# Patient Record
Sex: Female | Born: 1955
Health system: Southern US, Community
[De-identification: ages and names within clinical notes are randomized; demographics above are authoritative.]

## PROBLEM LIST (undated history)

## (undated) DIAGNOSIS — K219 Gastro-esophageal reflux disease without esophagitis: Secondary | ICD-10-CM

## (undated) DIAGNOSIS — K5730 Diverticulosis of large intestine without perforation or abscess without bleeding: Secondary | ICD-10-CM

## (undated) DIAGNOSIS — J449 Chronic obstructive pulmonary disease, unspecified: Secondary | ICD-10-CM

## (undated) DIAGNOSIS — H409 Unspecified glaucoma: Secondary | ICD-10-CM

## (undated) DIAGNOSIS — B0089 Other herpesviral infection: Secondary | ICD-10-CM

## (undated) DIAGNOSIS — E039 Hypothyroidism, unspecified: Secondary | ICD-10-CM

## (undated) HISTORY — PX: TONSILLECTOMY: SUR1361

## (undated) HISTORY — DX: Hypothyroidism, unspecified: E03.9

## (undated) HISTORY — DX: Unspecified glaucoma: H40.9

## (undated) HISTORY — PX: VAGINAL HYSTERECTOMY: SUR661

## (undated) HISTORY — DX: Diverticulosis of large intestine without perforation or abscess without bleeding: K57.30

## (undated) HISTORY — PX: INCONTINENCE SURGERY: SHX676

## (undated) HISTORY — PX: BREAST EXCISIONAL BIOPSY: SUR124

## (undated) HISTORY — DX: Chronic obstructive pulmonary disease, unspecified: J44.9

## (undated) HISTORY — DX: Gastro-esophageal reflux disease without esophagitis: K21.9

## (undated) HISTORY — PX: BREAST CYST EXCISION: SHX579

## (undated) HISTORY — DX: Other herpesviral infection: B00.89

---

## 2005-06-01 ENCOUNTER — Encounter: Payer: Self-pay | Admitting: Family Medicine

## 2005-06-19 ENCOUNTER — Encounter: Payer: Self-pay | Admitting: Family Medicine

## 2007-01-03 ENCOUNTER — Encounter: Payer: Self-pay | Admitting: Family Medicine

## 2007-02-11 ENCOUNTER — Encounter: Payer: Self-pay | Admitting: Family Medicine

## 2007-12-13 ENCOUNTER — Encounter: Payer: Self-pay | Admitting: Family Medicine

## 2008-01-03 ENCOUNTER — Encounter: Payer: Self-pay | Admitting: Family Medicine

## 2008-05-21 ENCOUNTER — Encounter: Payer: Self-pay | Admitting: Family Medicine

## 2009-07-16 ENCOUNTER — Encounter: Payer: Self-pay | Admitting: Family Medicine

## 2009-07-16 ENCOUNTER — Other Ambulatory Visit: Admission: RE | Admit: 2009-07-16 | Discharge: 2009-07-16 | Payer: Self-pay | Admitting: Family Medicine

## 2009-07-16 ENCOUNTER — Ambulatory Visit: Payer: Self-pay | Admitting: Family Medicine

## 2009-07-16 LAB — CONVERTED CEMR LAB
Bilirubin Urine: NEGATIVE
Blood in Urine, dipstick: NEGATIVE
Glucose, Urine, Semiquant: NEGATIVE
Ketones, urine, test strip: NEGATIVE
Nitrite: NEGATIVE
Protein, U semiquant: NEGATIVE
Specific Gravity, Urine: <1.005
Urobilinogen, UA: 0.2
WBC Urine, dipstick: NEGATIVE
pH: 5.5

## 2009-07-17 ENCOUNTER — Encounter: Payer: Self-pay | Admitting: Family Medicine

## 2009-07-18 LAB — CONVERTED CEMR LAB
Clue Cells Wet Prep HPF POC: NONE SEEN
Trich, Wet Prep: NONE SEEN
Yeast Wet Prep HPF POC: NONE SEEN

## 2009-09-20 ENCOUNTER — Encounter: Admission: RE | Admit: 2009-09-20 | Discharge: 2009-09-20 | Payer: Self-pay | Admitting: Family Medicine

## 2009-09-29 ENCOUNTER — Telehealth: Payer: Self-pay | Admitting: Family Medicine

## 2009-09-29 DIAGNOSIS — Z780 Asymptomatic menopausal state: Secondary | ICD-10-CM | POA: Insufficient documentation

## 2009-09-29 DIAGNOSIS — H409 Unspecified glaucoma: Secondary | ICD-10-CM

## 2009-09-29 HISTORY — DX: Unspecified glaucoma: H40.9

## 2010-10-13 ENCOUNTER — Ambulatory Visit: Payer: Self-pay | Admitting: Family Medicine

## 2010-10-13 DIAGNOSIS — E0789 Other specified disorders of thyroid: Secondary | ICD-10-CM | POA: Insufficient documentation

## 2010-10-13 DIAGNOSIS — J0100 Acute maxillary sinusitis, unspecified: Secondary | ICD-10-CM | POA: Insufficient documentation

## 2010-10-13 DIAGNOSIS — J019 Acute sinusitis, unspecified: Secondary | ICD-10-CM | POA: Insufficient documentation

## 2010-10-14 ENCOUNTER — Telehealth: Payer: Self-pay | Admitting: Family Medicine

## 2010-10-14 LAB — CONVERTED CEMR LAB: TSH: 2.602 microintl units/mL (ref 0.350–4.500)

## 2010-11-23 ENCOUNTER — Ambulatory Visit
Admission: RE | Admit: 2010-11-23 | Discharge: 2010-11-23 | Payer: Self-pay | Source: Home / Self Care | Attending: Family Medicine | Admitting: Family Medicine

## 2010-12-10 ENCOUNTER — Ambulatory Visit
Admission: RE | Admit: 2010-12-10 | Discharge: 2010-12-10 | Payer: Self-pay | Source: Home / Self Care | Admitting: Emergency Medicine

## 2010-12-10 ENCOUNTER — Encounter: Payer: Self-pay | Admitting: Family Medicine

## 2010-12-10 DIAGNOSIS — K5730 Diverticulosis of large intestine without perforation or abscess without bleeding: Secondary | ICD-10-CM | POA: Insufficient documentation

## 2010-12-10 HISTORY — DX: Diverticulosis of large intestine without perforation or abscess without bleeding: K57.30

## 2010-12-10 LAB — CONVERTED CEMR LAB
Bilirubin Urine: NEGATIVE
Glucose, Urine, Semiquant: NEGATIVE
Nitrite: NEGATIVE
Specific Gravity, Urine: 1.02
Urobilinogen, UA: 0.2
pH: 5.5

## 2010-12-12 ENCOUNTER — Telehealth: Payer: Self-pay | Admitting: Family Medicine

## 2010-12-13 ENCOUNTER — Telehealth (INDEPENDENT_AMBULATORY_CARE_PROVIDER_SITE_OTHER): Payer: Self-pay | Admitting: *Deleted

## 2010-12-13 NOTE — Assessment & Plan Note (Signed)
Summary: Sinsutis   Vital Signs:  Patient profile:   55 year old female Height:      63.5 inches Weight:      148 pounds BMI:     25.90 O2 Sat:      94 % on Room air Temp:     98.1 degrees F oral Pulse rate:   80 / minute BP sitting:   122 / 66  (left arm) Cuff size:   regular  Vitals Entered By: Payton Spark CMA (October 13, 2010 4:19 PM)  O2 Flow:  Room air CC: Sinus congestion and cough x 1 week.    Primary Care Provider:  Nani Gasser, MD  CC:  Sinus congestion and cough x 1 week. Marland Kitchen  History of Present Illness: Sinus congestion and cough x 1 week.   Had similar signs about 2-3 weeks ago and then got better and now started to feel worse the last 3 days.  Drainage and cough. cough is primarily productive and has a "bad color".   Bloody nasal discharge. NO fever. Took theraflu - minimal relief.  Last time lasted about 2 weeks. Feels very fatigued.  Head with some pressure. No ear pain.   No GI sxs.   Allergies: No Known Drug Allergies  Physical Exam  General:  Well-developed,well-nourished,in no acute distress; alert,appropriate and cooperative throughout examination Head:  Normocephalic and atraumatic without obvious abnormalities. No apparent alopecia or balding. Eyes:  No corneal or conjunctival inflammation noted. EOMI. Perrla. Ears:  External ear exam shows no significant lesions or deformities.  Otoscopic examination reveals clear canals, tympanic membranes are intact bilaterally without bulging, retraction, inflammation or discharge. Hearing is grossly normal bilaterally. Nose:  External nasal examination shows no deformity or inflammation. Nasal mucosa are pink and moist without lesions or exudates. Mouth:  Oral mucosa and oropharynx without lesions or exudates.   Neck:  No deformities, masses, or tenderness noted. Lungs:  Normal respiratory effort, chest expands symmetrically. Lungs are clear to auscultation, no crackles or wheezes. Heart:  Normal rate and  regular rhythm. S1 and S2 normal without gallop, murmur, click, rub or other extra sounds. Skin:  no rashes.   Cervical Nodes:  No lymphadenopathy noted Psych:  Cognition and judgment appear intact. Alert and cooperative with normal attention span and concentration. No apparent delusions, illusions, hallucinations   Impression & Recommendations:  Problem # 1:  SINUSITIS - ACUTE-NOS (ICD-461.9) I explained to her that her symptoms are possibly viral.  It sounds to me more like she has had two viral illnesses and red.  I will go ahead and give her an antibiotic to fill if she continues to get worse or she is not feeling better.  I explained to her that she pills antibiotics and doesn't feel tremendously better within a few days that it probably is viral and will need to run its course for one to two weeks.  I do understand her frustration with being sick lately and not wanting to be sick again.  I also in case her a cough medicine to use that at times she is sleeping very poorly secondary to cough. Her updated medication list for this problem includes:    Hydrocodone-homatropine 5-1.5 Mg/67ml Syrp (Hydrocodone-homatropine) .Marland KitchenMarland KitchenMarland KitchenMarland Kitchen 5ml by mouth at bedtime as needed cough    Amoxicillin 875 Mg Tabs (Amoxicillin) .Marland Kitchen... Take 1 tablet by mouth two times a day for 10 days  Complete Medication List: 1)  Zyrtec Allergy 10 Mg Tabs (Cetirizine hcl) .... Take one tablet  by mouth one ad ay 2)  Lumigan 0.03 % Soln (Bimatoprost) .... Apply one drop each eye daily 3)  Multivitamins Tabs (Multiple vitamin) .... Take one tablet by mouth once a day 4)  Vitamin C 500 Mg Chew (Ascorbic acid) .... Take one tablet by mouth once a adya 5)  Aspir-low 81 Mg Tbec (Aspirin) .... Take 1/2 tablet by mouth once ad ay 6)  Hydrocodone-homatropine 5-1.5 Mg/16ml Syrp (Hydrocodone-homatropine) .... 5ml by mouth at bedtime as needed cough 7)  Amoxicillin 875 Mg Tabs (Amoxicillin) .... Take 1 tablet by mouth two times a day for 10  days  Other Orders: T-TSH ([address]-60454) T-Mammography Bilateral Screening (09811) Tdap => 65yrs IM (91478) Admin 1st Vaccine (29562) Admin 1st Vaccine (13086) Flu Vaccine 70yrs + (57846)  Patient Instructions: 1)  Call if not better in one week 2)  Call [phone] to schedule your mammogram.  Prescriptions: AMOXICILLIN 875 MG TABS (AMOXICILLIN) Take 1 tablet by mouth two times a day for 10 days  #20 x 0   Entered and Authorized by:   Nani Gasser MD   Signed by:   Nani Gasser MD on 10/13/2010   Method used:   Electronically to        [facility] #[id number]* (retail)       [address], [place]  [address]       Ph: [phone]       Fax: [phone]   RxID:   [id number] AMOXICILLIN 875 MG TABS (AMOXICILLIN) Take 1 tablet by mouth two times a day for 10 days  #20 x 0   Entered and Authorized by:   Nani Gasser MD   Signed by:   Nani Gasser MD on 10/13/2010   Method used:   Printed then faxed to ...       [facility] #[id number]* (retail)       [address], [place]  [address]       Ph: [phone]       Fax: [phone]   RxID:   [id number] HYDROCODONE-HOMATROPINE 5-1.5 MG/5ML SYRP (HYDROCODONE-HOMATROPINE) 5ml by mouth at bedtime as needed cough  #184ml x 0   Entered and Authorized by:   Nani Gasser MD   Signed by:   Nani Gasser MD on 10/13/2010   Method used:   Printed then faxed to ...       [facility] [phone]* (retail)       [address], [place]  [address]       Ph: [phone]       Fax: [phone]   RxID:   [id number]    Orders Added: 1)  T-TSH [phone] 2)  T-Mammography Bilateral Screening [77057] 3)  Tdap => 106yrs IM [90715] 4)  Admin 1st Vaccine [90471] 5)  Admin 1st Vaccine [90471] 6)  Flu Vaccine 65yrs + [17510] 7)  Est. Patient Level III [25852]   Immunizations Administered:  Tetanus Vaccine:    Vaccine Type: Tdap    Site:  right deltoid    Dose: 0.5 ml    Route: IM    Given by: Payton Spark CMA    Exp. Date: 09/01/2012    Lot #: [id number]    VIS given: 09/30/08 version given October 13, 2010.   Immunizations Administered:  Tetanus Vaccine:    Vaccine Type: Tdap    Site:  right deltoid    Dose: 0.5 ml    Route: IM    Given by: Payton Spark CMA    Exp. Date: 09/01/2012    Lot #: [id number]    VIS given: 09/30/08 version given October 13, 2010.   Flu Vaccine Consent Questions     Do you have a history of severe allergic reactions to this vaccine? no    Any prior history of allergic reactions to egg and/or gelatin? no    Do you have a sensitivity to the preservative Thimersol? no    Do you have a past history of Guillan-Barre Syndrome? no    Do you currently have an acute febrile illness? no    Have you ever had a severe reaction to latex? no    Vaccine information given and explained to patient? yes    Are you currently pregnant? no    Lot Number:[id number]   Exp Date:05/13/2011   Site Given  Left Deltoid IM    .lbflu

## 2010-12-13 NOTE — Assessment & Plan Note (Signed)
Summary: NOV: pap, vaginitis,    Vital Signs:  Patient profile:   55 year old female Height:      63.5 inches Weight:      144 pounds BMI:     25.20 Pulse rate:   71 / minute BP sitting:   130 / 71  (left arm) Cuff size:   regular  Vitals Entered By: Kathlene November (July 16, 2009 9:34 AM) CC: NP- pap smear Is Patient Diabetic? No   Primary Care Clide Remmers:  Nani Gasser, MD  CC:  NP- pap smear.  History of Present Illness: Has been having some vaginal itching and irriation. Tried some OTC fungal cream but didn't help. Sxs for 1-1/2 months. Some irritation when urinates.    No vaginal d/c.  Married.  No other alleviating or aggrevating factors. Say has been on HRT in the past but says she feels it mader her hot flashes et worse so is not on anything. Says also not sleeping well.   Habits & Providers  Alcohol-Tobacco-Diet     Alcohol drinks/day: 1     Tobacco Status: current     Cigarette Packs/Day: 1.0  Exercise-Depression-Behavior     Does Patient Exercise: no     STD Risk: never     Drug Use: no     Seat Belt Use: always  Current Medications (verified): 1)  Zyrtec Allergy 10 Mg Tabs (Cetirizine Hcl) .... Take One Tablet By Mouth One Ad Ay 2)  Lumigan 0.03 % Soln (Bimatoprost) .... Apply One Drop Each Eye Daily 3)  Multivitamins  Tabs (Multiple Vitamin) .... Take One Tablet By Mouth Once A Day 4)  Vitamin C 500 Mg Chew (Ascorbic Acid) .... Take One Tablet By Mouth Once A Adya 5)  Aspir-Low 81 Mg Tbec (Aspirin) .... Take 1/2 Tablet By Mouth Once Ad Ay  Allergies (verified): No Known Drug Allergies  Comments:  Nurse/Medical Assistant: The patient's medications and allergies were reviewed with the patient and were updated in the Medication and Allergy Lists. Kathlene November (July 16, 2009 9:37 AM)  Past History:  Past Medical History: None  Past Surgical History: Lump in breast Cyst in wrist hysterectomy and oophorectomy- tumor, noncancerous Bladder  surgery  Family History: Father with Lung Ca GM with MI Mother with thyroid dz.   Social History: HS degree.  Married to [employer] with one child.   Current Smoker Alcohol use-yes Drug use-no Regular exercise-no Smoking Status:  current Packs/Day:  1.0 Does Patient Exercise:  no STD Risk:  never Drug Use:  no Seat Belt Use:  always  Review of Systems       No fever/sweats/weakness, unexplained weight loss/gain.  No vison changes.  No difficulty hearing/ringing in ears, hay fever/allergies.  No chest pain/discomfort, palpitations.  No Br lump/nipple discharge.  No cough/wheeze.  No blood in BM, nausea/vomiting/diarrhea.  No nighttime urination, leaking urine, unusual vaginal bleeding, discharge (penis or vagina).  No muscle/joint pain. No rash, change in mole.  No HA, memory loss.  No anxiety, sleep d/o, depression.  No easy bruising/bleeding, unexplained lump   Physical Exam  General:  Well-developed,well-nourished,in no acute distress; alert,appropriate and cooperative throughout examination Head:  Normocephalic and atraumatic without obvious abnormalities. No apparent alopecia or balding. Lungs:  Normal respiratory effort, chest expands symmetrically. Lungs are clear to auscultation, no crackles or wheezes. Heart:  Normal rate and regular rhythm. S1 and S2 normal without gallop, murmur, click, rub or other extra sounds. Genitalia:  Normal introitus for  age, no external lesions, no vaginal discharge, mucosa pink. No masses.  Some mild vaginal atrophy.  mucosa was tender.   Skin:  no rashes.   Psych:  Cognition and judgment appear intact. Alert and cooperative with normal attention span and concentration. No apparent delusions, illusions, hallucinations   Impression & Recommendations:  Problem # 1:  SCREENING FOR MALIGNANT NEOPLASM OF THE CERVIX (ICD-V76.2) Pap performed per pt request but let her know that since she has had a hystrectomy doesn' t  need any future paps.     Problem # 2:  VAGINITIS (ICD-616.10)  Discussed will send wet prep. UA was neg for infection.  though on exam has some vaginal atrophyl   Orders: T-Wet Prep for Christoper Allegra, [facility] [phone]) UA Dipstick w/o Micro (automated)  (81003)  Complete Medication List: 1)  Zyrtec Allergy 10 Mg Tabs (Cetirizine hcl) .... Take one tablet by mouth one ad ay 2)  Lumigan 0.03 % Soln (Bimatoprost) .... Apply one drop each eye daily 3)  Multivitamins Tabs (Multiple vitamin) .... Take one tablet by mouth once a day 4)  Vitamin C 500 Mg Chew (Ascorbic acid) .... Take one tablet by mouth once a adya 5)  Aspir-low 81 Mg Tbec (Aspirin) .... Take 1/2 tablet by mouth once ad ay  Other Orders: T-Mammography Bilateral Screening (09811)  Laboratory Results   Urine Tests  Date/Time Received: 07/16/2009 Date/Time Reported: 07/16/2009  Routine Urinalysis   Color: yellow Appearance: Clear Glucose: negative   (Normal Range: Negative) Bilirubin: negative   (Normal Range: Negative) Ketone: negative   (Normal Range: Negative) Spec. Gravity: <1.005   (Normal Range: 1.003-1.035) Blood: negative   (Normal Range: Negative) pH: 5.5   (Normal Range: 5.0-8.0) Protein: negative   (Normal Range: Negative) Urobilinogen: 0.2   (Normal Range: 0-1) Nitrite: negative   (Normal Range: Negative) Leukocyte Esterace: negative   (Normal Range: Negative)        Appended Document: NOV: pap, vaginitis,     Past History:  Past Medical History: Hx of GERD    Lipid Panel Test Date: 05/21/2008                        Value        Units        H/L   Reference  Cholesterol:          226          mg/dL              (914-782) LDL Cholesterol:      132          mg/dL              (95-621) HDL Cholesterol:      70           mg/dL              (30-86) Triglyceride:         11           mg/dL         L    (57-846)   Flex Sig Next Due:  Not Indicated Colonoscopy Result Date:  02/11/2007 Colonoscopy Result:   normal Colonoscopy Next Due:  10 yr Hemoccult Next Due:  Not Indicated   Appended Document: NOV: pap, vaginitis,   Mammogram Result Date:  01/03/2008 Mammogram Result:  normal Mammogram Next Due:  2 yr    Preventive Care Screening  Bone Density:  Date:  12/03/2007    Next Due:  12/2009    Results:  abnormal

## 2010-12-13 NOTE — Progress Notes (Signed)
Summary: Cough med  Phone Note Call from Patient Call back at Home Phone [phone]   Caller: Patient Call For: Nani Gasser MD Summary of Call: Pt calls and states the pharmacy never received the cough med- they got the antibiotic but no cough med. Can you please refax the med to pharamcy Initial call taken by: Kathlene November LPN,  October 14, 2010 9:46 AM  Follow-up for Phone Call        this has been done.was faxed again after this message was taken fri Follow-up by: Avon Gully CMA, Duncan Dull),  October 17, 2010 10:53 AM

## 2010-12-13 NOTE — Progress Notes (Signed)
----   Converted from flag ---- ---- 09/29/2009 1:07 PM, Kathlene November wrote: 09/27/2009 @ 1:06pm-Pt notified. KJ LPN Need cholesterol order  ---- 09/29/2009 12:47 PM, Nani Gasser MD wrote: Also needs an up to date cholesterol. Has been over a year.   ---- 09/29/2009 12:46 PM, Nani Gasser MD wrote: Call pt: Went through old records and don't have a tetanus listed for you.  Can make nurse visiti to get if would like. ------------------------------     New Problems: SCREENING FOR LIPOID DISORDERS (ICD-V77.91)   New Problems: SCREENING FOR LIPOID DISORDERS (ICD-V77.91)

## 2010-12-13 NOTE — Procedures (Signed)
Summary: Colon/[facility]  Colon/[facility]   Imported By: Lester Lamar 10/01/2009 09:43:05  _____________________________________________________________________  External Attachment:    Type:   Image     Comment:   External Document

## 2010-12-15 NOTE — Assessment & Plan Note (Signed)
Summary: sinusitis   Vital Signs:  Patient profile:   55 year old female Height:      63.5 inches Weight:      146 pounds BMI:     25.55 O2 Sat:      95 % on Room air Temp:     98.5 degrees F oral Pulse rate:   70 / minute BP sitting:   129 / 65  (left arm) Cuff size:   regular  Vitals Entered By: Payton Spark CMA (November 23, 2010 1:25 PM)  O2 Flow:  Room air CC: Sinusitis x weeks.    Primary Care Provider:  Nani Gasser, MD  CC:  Sinusitis x weeks. Marland Kitchen  History of Present Illness: 55 yo WF presents for head congestion and HA, cough and postnasal drip x 2-3 wks with aching all over.  She is taking theraflu which helps some.  She has glaucoma so is supposed to stay off decongestants.  Her cough keeps her up at night and she's a smoker.  She has some SOB from coughing spells but no chest tightness.  She has a lot of rhinorrhea.  She has frontal sinus pressure.      Current Medications (verified): 1)  Zyrtec Allergy 10 Mg Tabs (Cetirizine Hcl) .... Take One Tablet By Mouth One Ad Ay 2)  Lumigan 0.03 % Soln (Bimatoprost) .... Apply One Drop Each Eye Daily 3)  Multivitamins  Tabs (Multiple Vitamin) .... Take One Tablet By Mouth Once A Day 4)  Vitamin C 500 Mg Chew (Ascorbic Acid) .... Take One Tablet By Mouth Once A Adya 5)  Aspir-Low 81 Mg Tbec (Aspirin) .... Take 1/2 Tablet By Mouth Once Ad Ay  Allergies (verified): No Known Drug Allergies  Past History:  Past Medical History: Hx of GERD smoker  Social History: Reviewed history from 07/16/2009 and no changes required. HS degree.  Married to [employer] with one child.   Current Smoker Alcohol use-yes Drug use-no Regular exercise-no  Review of Systems      See HPI  Physical Exam  General:  alert, well-developed, well-nourished, and well-hydrated.   Head:  normocephalic and atraumatic.  slight maxillary edema with tenderness Eyes:  conjunctiva glassy, clear, EOMI Ears:  EACs patent; TMs translucent  and gray with good cone of light and bony landmarks.  Nose:  nasal congestions Mouth:  pharynx pink and moist.  o/p injected Lungs:  Normal respiratory effort, chest expands symmetrically. Lungs are clear to auscultation, no crackles or wheezes.  rhonchi with cough only Heart:  Normal rate and regular rhythm. S1 and S2 normal without gallop, murmur, click, rub or other extra sounds. Skin:  color normal.   Cervical Nodes:  No lymphadenopathy noted   Impression & Recommendations:  Problem # 1:  SINUSITIS - ACUTE-NOS (ICD-461.9) Treat with 10 days Amoxicillin + Cheratussin AC at night in addition to supportive care measures spelt out under pt instructions.  Call if not improved in 10 days. The following medications were removed from the medication list:    Hydrocodone-homatropine 5-1.5 Mg/29ml Syrp (Hydrocodone-homatropine) .Marland KitchenMarland KitchenMarland KitchenMarland Kitchen 5ml by mouth at bedtime as needed cough    Amoxicillin 875 Mg Tabs (Amoxicillin) .Marland Kitchen... Take 1 tablet by mouth two times a day for 10 days Her updated medication list for this problem includes:    Amoxicillin 500 Mg Caps (Amoxicillin) .Marland Kitchen... 1 capsule by mouth three times a day x 10 days    Cheratussin Ac 100-10 Mg/72ml Syrp (Guaifenesin-codeine) .Marland Kitchen... 5-10 ml by mouth at bedtime as  needed cough  Complete Medication List: 1)  Zyrtec Allergy 10 Mg Tabs (Cetirizine hcl) .... Take one tablet by mouth one ad ay 2)  Lumigan 0.03 % Soln (Bimatoprost) .... Apply one drop each eye daily 3)  Multivitamins Tabs (Multiple vitamin) .... Take one tablet by mouth once a day 4)  Vitamin C 500 Mg Chew (Ascorbic acid) .... Take one tablet by mouth once a adya 5)  Aspir-low 81 Mg Tbec (Aspirin) .... Take 1/2 tablet by mouth once ad ay 6)  Amoxicillin 500 Mg Caps (Amoxicillin) .Marland Kitchen.. 1 capsule by mouth three times a day x 10 days 7)  Cheratussin Ac 100-10 Mg/23ml Syrp (Guaifenesin-codeine) .... 5-10 ml by mouth at bedtime as needed cough  Patient Instructions: 1)  Take 10 days of  Amoxicilin for sinusitis. 2)  Avoid smoking. 3)  Use OTC Mucinex + Ibuprofen + nasal saline during the day and RX Cheratussin AC at night for symptomatic relief. 4)  Call if not improved in 10 days.   Prescriptions: CHERATUSSIN AC 100-10 MG/5ML SYRP (GUAIFENESIN-CODEINE) 5-10 ml by mouth at bedtime as needed cough  #150 ml x 0   Entered and Authorized by:   Seymour Bars DO   Signed by:   Seymour Bars DO on 11/23/2010   Method used:   Printed then faxed to ...       [facility] [phone]* (retail)       [address], [place]  [address]       Ph: [phone]       Fax: [phone]   RxID:   [id number] AMOXICILLIN 500 MG CAPS (AMOXICILLIN) 1 capsule by mouth three times a day x 10 days  #30 x 0   Entered and Authorized by:   Seymour Bars DO   Signed by:   Seymour Bars DO on 11/23/2010   Method used:   Electronically to        [facility] #[id number]* (retail)       [address], [place]  [address]       Ph: [phone]       Fax: [phone]   RxID:   [id number]    Orders Added: 1)  Est. Patient Level III [16010]

## 2010-12-15 NOTE — Assessment & Plan Note (Signed)
Summary: KIDNEY PROBLEMS/WSE   Vital Signs:  Patient Profile:   55 Years Old Female CC:      Lower back pain, fever x 2 days, dysuria, polyuria Height:     63.5 inches Weight:      146 pounds O2 Sat:      92 % O2 treatment:    Room Air Temp:     99.4 degrees F oral Pulse rate:   94 / minute Pulse rhythm:   regular Resp:     20 per minute BP sitting:   113 / 77  (left arm) Cuff size:   regular  Vitals Entered By: Emilio Math (December 10, 2010 5:28 PM)                  Current Allergies: No known allergies History of Present Illness History from: patient Chief Complaint: Lower back pain, fever x 2 days, dysuria, polyuria History of Present Illness: 55 Years Old Female complains of UTI symptoms for 2 days.  She describes the pain as mild burning during urination.  She has not used any OTC meds.  She also has a history of kidney stones and diverticulitis. + dysuria + frequency + urgency No hematuria No vaginal discharge No fever/chills No lower abdomenal pain + back pain (bilateral) No fatigue   REVIEW OF SYSTEMS Constitutional Symptoms       Complains of fever.     Denies chills, night sweats, weight loss, weight gain, and fatigue.  Eyes       Denies change in vision, eye pain, eye discharge, glasses, contact lenses, and eye surgery. Ear/Nose/Throat/Mouth       Denies hearing loss/aids, change in hearing, ear pain, ear discharge, dizziness, frequent runny nose, frequent nose bleeds, sinus problems, sore throat, hoarseness, and tooth pain or bleeding.  Respiratory       Denies dry cough, productive cough, wheezing, shortness of breath, asthma, bronchitis, and emphysema/COPD.  Cardiovascular       Denies murmurs, chest pain, and tires easily with exhertion.    Gastrointestinal       Complains of stomach pain.      Denies nausea/vomiting, diarrhea, constipation, blood in bowel movements, and indigestion. Genitourniary       Complains of painful urination.       Denies kidney stones and loss of urinary control. Neurological       Denies paralysis, seizures, and fainting/blackouts. Musculoskeletal       Complains of muscle pain and joint pain.      Denies joint stiffness, decreased range of motion, redness, swelling, muscle weakness, and gout.  Skin       Denies bruising, unusual mles/lumps or sores, and hair/skin or nail changes.  Psych       Denies mood changes, temper/anger issues, anxiety/stress, speech problems, depression, and sleep problems.  Past History:  Past Medical History: Hx of GERD smoker Diverticulosis, colon Glaucoma IBS  Past Surgical History: Reviewed history from 07/16/2009 and no changes required. Lump in breast Cyst in wrist hysterectomy and oophorectomy- tumor, noncancerous Bladder surgery  Family History: Reviewed history from 07/16/2009 and no changes required. Father with Lung Ca GM with MI Mother with thyroid dz.   Social History: HS degree.  Married to [employer] with one child.   Current Smoker, 1ppd for 30 yrs Alcohol use-yes Drug use-no Regular exercise-no Physical Exam General appearance: well developed, well nourished, no acute distress Chest/Lungs: no rales, wheezes, or rhonchi bilateral, breath sounds equal without effort Heart: regular  rate and  rhythm, no murmur Abdomen: soft, non-tender without obvious organomegaly Back: mild L CVAT MSE: oriented to time, place, and person Assessment New Problems: URINARY TRACT INFECTION (ICD-599.0) DIVERTICULOSIS, COLON (ICD-562.10)   Patient Education: Patient and/or caregiver instructed in the following: rest, fluids, Ibuprofen prn.  Plan New Medications/Changes: CIPROFLOXACIN HCL 250 MG TABS (CIPROFLOXACIN HCL) 1 by mouth two times a day for 7 days  #14 x 0, 12/10/2010, Hoyt Koch MD  New Orders: New Patient Level III [phone] UA Dipstick w/o Micro (automated)  [81003] T-Culture, Urine [[id number]] Planning Comments:     Hydration, ibuprofen as needed, rest Take meds as directed Urine culture is pending Patient also with a history of kidney stone and diverticulitis.  The Cipro would help diverticulitis, but keep both in the differential diagnosis.  If not improving in 2-3 days, would suggest follow up with PCP.   The patient and/or caregiver has been counseled thoroughly with regard to medications prescribed including dosage, schedule, interactions, rationale for use, and possible side effects and they verbalize understanding.  Diagnoses and expected course of recovery discussed and will return if not improved as expected or if the condition worsens. Patient and/or caregiver verbalized understanding.  Prescriptions: CIPROFLOXACIN HCL 250 MG TABS (CIPROFLOXACIN HCL) 1 by mouth two times a day for 7 days  #14 x 0   Entered and Authorized by:   Hoyt Koch MD   Signed by:   Hoyt Koch MD on 12/10/2010   Method used:   Print then Give to Patient   RxID:   [id number]   Orders Added: 1)  New Patient Level III [34742] 2)  UA Dipstick w/o Micro (automated)  [81003] 3)  T-Culture, Urine [[id number]]    Laboratory Results   Urine Tests  Date/Time Received: December 10, 2010 5:52 PM  Date/Time Reported: December 10, 2010 5:52 PM   Routine Urinalysis   Color: yellow Appearance: Hazy Glucose: negative   (Normal Range: Negative) Bilirubin: negative   (Normal Range: Negative) Ketone: 3+   (Normal Range: Negative) Spec. Gravity: 1.020   (Normal Range: 1.003-1.035) Blood: 2+   (Normal Range: Negative) pH: 5.5   (Normal Range: 5.0-8.0) Protein: 1+   (Normal Range: Negative) Urobilinogen: 0.2   (Normal Range: 0-1) Nitrite: negative   (Normal Range: Negative) Leukocyte Esterace: 1+   (Normal Range: Negative)

## 2010-12-21 NOTE — Progress Notes (Signed)
  Phone Note Outgoing Call Call back at [facility] Phone [phone]   Call placed by: Emilio Math,  December 13, 2010 12:41 PM Call placed to: Patient Summary of Call: Called patient left msg to see if see was feeling better.

## 2010-12-21 NOTE — Progress Notes (Signed)
  Phone Note Call from Patient Call back at [facility] Phone [phone]   Caller: Patient Summary of Call: Patient experiencing left arm and hand tingling could this be from Cipro, stomach cramping. [facility] on [facility].    Followup call to patient:  she has had some vague left arm tingling and numbness (but not weakness) after taking Cipro; also mild nausea and persistent abdominal discomfort.   Discussed urine culture results.  Recommend continuing Cipro.  Try taking with a small amount of food (e.g. Jello).  Follow-up with PCP if not improving. Donna Christen MD  December 12, 2010 8:08 PM

## 2011-08-08 ENCOUNTER — Other Ambulatory Visit: Payer: Self-pay | Admitting: Family Medicine

## 2011-08-08 DIAGNOSIS — Z1231 Encounter for screening mammogram for malignant neoplasm of breast: Secondary | ICD-10-CM

## 2011-08-22 ENCOUNTER — Ambulatory Visit
Admission: RE | Admit: 2011-08-22 | Discharge: 2011-08-22 | Disposition: A | Discharge status: Home / Self Care | Payer: Self-pay | Source: Ambulatory Visit | Attending: Family Medicine | Admitting: Family Medicine

## 2011-08-22 DIAGNOSIS — Z1231 Encounter for screening mammogram for malignant neoplasm of breast: Secondary | ICD-10-CM

## 2011-08-23 ENCOUNTER — Telehealth: Payer: Self-pay | Admitting: *Deleted

## 2011-08-23 NOTE — Telephone Encounter (Signed)
Message copied by Wyline Beady on Wed Aug 23, 2011  2:59 PM ------      Message from: Nani Gasser D      Created: Wed Aug 23, 2011  1:30 PM       Please call patient. Normal mammogram.  Repeat in 1 year.

## 2011-08-23 NOTE — Telephone Encounter (Signed)
Pt.notified

## 2011-09-28 ENCOUNTER — Ambulatory Visit: Payer: PRIVATE HEALTH INSURANCE

## 2011-09-29 ENCOUNTER — Ambulatory Visit: Payer: PRIVATE HEALTH INSURANCE

## 2011-10-02 ENCOUNTER — Ambulatory Visit (INDEPENDENT_AMBULATORY_CARE_PROVIDER_SITE_OTHER): Payer: PRIVATE HEALTH INSURANCE | Admitting: Family Medicine

## 2011-10-02 DIAGNOSIS — Z23 Encounter for immunization: Secondary | ICD-10-CM

## 2011-10-02 NOTE — Progress Notes (Signed)
Here for flu vaccine

## 2011-10-09 ENCOUNTER — Emergency Department (INDEPENDENT_AMBULATORY_CARE_PROVIDER_SITE_OTHER)
Admission: EM | Admit: 2011-10-09 | Discharge: 2011-10-09 | Disposition: A | Discharge status: Home / Self Care | Payer: PRIVATE HEALTH INSURANCE | Source: Home / Self Care | Attending: Emergency Medicine | Admitting: Emergency Medicine

## 2011-10-09 ENCOUNTER — Encounter: Payer: Self-pay | Admitting: *Deleted

## 2011-10-09 DIAGNOSIS — R05 Cough: Secondary | ICD-10-CM

## 2011-10-09 DIAGNOSIS — M545 Low back pain, unspecified: Secondary | ICD-10-CM

## 2011-10-09 DIAGNOSIS — J069 Acute upper respiratory infection, unspecified: Secondary | ICD-10-CM

## 2011-10-09 DIAGNOSIS — R059 Cough, unspecified: Secondary | ICD-10-CM

## 2011-10-09 LAB — POCT URINALYSIS DIPSTICK
Bilirubin, UA: NEGATIVE
Blood, UA: NEGATIVE
Glucose, UA: NEGATIVE
Ketones, UA: NEGATIVE
Leukocytes, UA: NEGATIVE
Nitrite, UA: NEGATIVE
Protein, UA: NEGATIVE
Spec Grav, UA: 1.01 (ref 1.005–1.03)
Urobilinogen, UA: 0.2 (ref 0–1)
pH, UA: 6 (ref 5–8)

## 2011-10-09 LAB — POCT RAPID STREP A (OFFICE): Rapid Strep A Screen: NEGATIVE

## 2011-10-09 MED ORDER — HYDROCODONE-HOMATROPINE 5-1.5 MG/5ML PO SYRP: 5.0000 mL | ORAL_SOLUTION | Freq: Four times a day (QID) | ORAL | Status: DC | PRN

## 2011-10-09 MED ORDER — AMOXICILLIN 875 MG PO TABS: 875.0000 mg | ORAL_TABLET | Freq: Two times a day (BID) | ORAL | Status: DC

## 2011-10-09 NOTE — ED Provider Notes (Signed)
History     CSN: [id number] Arrival date & time: 10/09/2011  9:33 AM   First MD Initiated Contact with Patient 10/09/11 [phone]      Chief Complaint  Patient presents with  . Cough  . URI    (Consider location/radiation/quality/duration/timing/severity/associated sxs/prior treatment) Patient is a 55 y.o. female presenting with cough and URI.  Cough  URI The primary symptoms include cough.   Diana Solis is a 55 y.o. female who complains of onset of cold symptoms for 3 days. She had her flu shot last week. She takes care of her granddaughter who last week was also sick but is now improved.  She also complains of right back pain and is concerned that she may have a urinary tract infection. However she did not have any symptoms such as dysuria hematuria or frequency. + sore throat + cough No pleuritic pain No wheezing + nasal congestion + post-nasal drainage + sinus pain/pressure No chest congestion No itchy/red eyes No earache No hemoptysis No SOB No chills/sweats No fever No nausea No vomiting No abdominal pain No diarrhea No skin rashes + fatigue + myalgias No headache    History reviewed. No pertinent past medical history.  Past Surgical History  Procedure Date  . Tonsillectomy     Family History  Problem Relation Age of Onset  . Asthma Mother   . Cancer Father     lung  . Lupus Father     History  Substance Use Topics  . Smoking status: Current Everyday Smoker -- 1.0 packs/day  . Smokeless tobacco: Not on file  . Alcohol Use: No    OB History    Grav Para Term Preterm Abortions TAB SAB Ect Mult Living                  Review of Systems  Respiratory: Positive for cough.     Allergies  Review of patient's allergies indicates no known allergies.  Home Medications   Current Outpatient Rx  Name Route Sig Dispense Refill  . BIMATOPROST 0.03 % OP SOLN  1 drop at bedtime.      Marland Kitchen CETIRIZINE HCL 5 MG PO TABS Oral Take 5 mg by mouth daily.      .  AMOXICILLIN 875 MG PO TABS Oral Take 1 tablet (875 mg total) by mouth 2 (two) times daily. 14 tablet 0  . HYDROCODONE-HOMATROPINE 5-1.5 MG/5ML PO SYRP Oral Take 5 mLs by mouth every 6 (six) hours as needed for cough. 90 mL 0    BP 120/70  Pulse 88  Temp(Src) 97.9 F (36.6 C) (Oral)  Resp 18  Ht 5\' 4"  (1.626 m)  Wt 152 lb (68.947 kg)  BMI 26.09 kg/m2  SpO2 96%  Physical Exam  Nursing note and vitals reviewed. Constitutional: She is oriented to person, place, and time. She appears well-developed and well-nourished.  HENT:  Head: Normocephalic and atraumatic.  Right Ear: Tympanic membrane, external ear and ear canal normal.  Left Ear: Tympanic membrane, external ear and ear canal normal.  Nose: Mucosal edema and rhinorrhea present.  Mouth/Throat: Posterior oropharyngeal erythema present. No oropharyngeal exudate or posterior oropharyngeal edema.  Neck: Neck supple.  Cardiovascular: Regular rhythm and normal heart sounds.   Pulmonary/Chest: Effort normal and breath sounds normal. No respiratory distress.  Abdominal: There is no CVA tenderness.  Musculoskeletal:       Mild R paralumbar spasm, tenderness  Neurological: She is alert and oriented to person, place, and time.  Skin: Skin is warm  and dry.  Psychiatric: She has a normal mood and affect. Her speech is normal.    ED Course  Procedures (including critical care time)   Labs Reviewed  POCT URINALYSIS DIPSTICK  POCT RAPID STREP A (OFFICE)   No results found.   1. Acute upper respiratory infections of unspecified site   2. Cough   3. Pain in lower back       MDM   1)  Take the prescribed antibiotic as instructed.  Cough meds given.  Her back pain is likely from a mild strain secondary to forceful coughing. A rapid strep was done today that was negative. A urinalysis was also done which was normal. 2)  Use nasal saline solution (over the counter) at least 3 times a day. 3)  Use over the counter decongestants like  Zyrtec-D every 12 hours as needed to help with congestion.  If you have hypertension, do not take medicines with sudafed.  4)  Can take tylenol every 6 hours or motrin every 8 hours for pain or fever. 5)  Follow up with your primary doctor if no improvement in 5-7 days, sooner if increasing pain, fever, or new symptoms.       Lily Kocher, MD 10/09/11 [phone]

## 2011-10-13 ENCOUNTER — Encounter: Payer: Self-pay | Admitting: Family Medicine

## 2011-10-19 ENCOUNTER — Encounter: Payer: Self-pay | Admitting: Family Medicine

## 2011-10-19 ENCOUNTER — Ambulatory Visit (INDEPENDENT_AMBULATORY_CARE_PROVIDER_SITE_OTHER): Payer: PRIVATE HEALTH INSURANCE | Admitting: Family Medicine

## 2011-10-19 VITALS — BP 104/62 | HR 80 | Wt 153.0 lb

## 2011-10-19 DIAGNOSIS — Z1211 Encounter for screening for malignant neoplasm of colon: Secondary | ICD-10-CM

## 2011-10-19 DIAGNOSIS — R32 Unspecified urinary incontinence: Secondary | ICD-10-CM

## 2011-10-19 DIAGNOSIS — Z0000 Encounter for general adult medical examination without abnormal findings: Secondary | ICD-10-CM

## 2011-10-19 DIAGNOSIS — K589 Irritable bowel syndrome without diarrhea: Secondary | ICD-10-CM

## 2011-10-19 MED ORDER — DOXYCYCLINE HYCLATE 100 MG PO TABS: 100.0000 mg | ORAL_TABLET | Freq: Two times a day (BID) | ORAL | Status: AC

## 2011-10-19 NOTE — Progress Notes (Signed)
Subjective:     Diana Solis is a 55 y.o. female and is here for a comprehensive physical exam. The patient reports problems - seen in UC 10 days ago and put on amox for sinusitis and given cough med. Felt better on the ABX but now that she has completed course she feesl she is getting worse wiht nasal congesiton, cough. No fever.  Marland Kitchen  History   Social History  . Marital Status: Married    Spouse Name: Darrel.     Number of Children: 1  . Years of Education: N/A   Occupational History  . Not on file.   Social History Main Topics  . Smoking status: Current Everyday Smoker -- 1.0 packs/day    Types: Cigarettes  . Smokeless tobacco: Not on file  . Alcohol Use: 3.0 oz/week    6 drink(s) per week  . Drug Use: No  . Sexually Active: Not on file   Other Topics Concern  . Not on file   Social History Narrative   No regular exercise. 3- 4 caffeine drinks per day. 3 grandkids.    Health Maintenance  Topic Date Due  . Colonoscopy  11/13/2010  . Influenza Vaccine  08/13/2012  . Mammogram  08/21/2013  . Tetanus/tdap  10/13/2020    The following portions of the patient's history were reviewed and updated as appropriate: allergies, current medications, past family history, past medical history, past social history, past surgical history and problem list.  Review of Systems A comprehensive review of systems was negative.   Objective:    BP 104/62  Pulse 80  Wt 153 lb (69.4 kg) General appearance: alert, cooperative and appears stated age Head: Normocephalic, without obvious abnormality, atraumatic Eyes: conj clear, EOMi, PEERLA Ears: normal TM's and external ear canals both ears Nose: Nares normal. Septum midline. Mucosa normal. No drainage or sinus tenderness. Throat: lips, mucosa, and tongue normal; teeth and gums normal Neck: no adenopathy, no carotid bruit, no JVD, supple, symmetrical, trachea midline and thyroid not enlarged, symmetric, no tenderness/mass/nodules Back:  symmetric, no curvature. ROM normal. No CVA tenderness. Lungs: clear to auscultation bilaterally Breasts: normal appearance, no masses or tenderness Heart: regular rate and rhythm, S1, S2 normal, no murmur, click, rub or gallop Abdomen: soft, non-tender; bowel sounds normal; no masses,  no organomegaly Extremities: extremities normal, atraumatic, no cyanosis or edema Pulses: 2+ and symmetric Skin: Skin color, texture, turgor normal. No rashes or lesions Lymph nodes: Cervical, supraclavicular, and axillary nodes normal. Neurologic: Alert and oriented X 3, normal strength and tone. Normal symmetric reflexes. Normal coordination and gait    Assessment:    Healthy female exam.      Plan:     See After Visit Summary for Counseling Recommendations  Start a regular exercise program and make sure you are eating a healthy diet Try to eat 4 servings of dairy a day or take a calcium supplement (500mg  twice a day). Your vaccines are up to date.  Given lab slip. Will call with results  Sinusitis - will tx with doxy since smoker. Encouragd her to quit. She is not ready  Hot flashes/Mood swings - Has tried HRT in the past and made her hotflashes worse.  I discussed that tx with an SSRI really would be her best option for anxiety and for menopausal symptoms.  Discussed potential side effects. She wants ot think about it. Call next week if decides to start this.    She also wants a note for [employer]  to not go bc of her spastic colon colon and urinary incontinence.

## 2011-10-19 NOTE — Patient Instructions (Signed)
Start a regular exercise program and make sure you are eating a healthy diet Try to eat 4 servings of dairy a day or take a calcium supplement (500mg  twice a day). Your vaccines are up to date.   Think about the mood medication and call me if you decide you want to try it.

## 2011-12-20 LAB — LIPID PANEL
Cholesterol: 197 mg/dL (ref 0–200)
HDL: 58 mg/dL (ref >39)
LDL Cholesterol: 117 mg/dL — ABNORMAL HIGH (ref 0–99)
Total CHOL/HDL Ratio: 3.4 Ratio
Triglycerides: 111 mg/dL (ref <150)
VLDL: 22 mg/dL (ref 0–40)

## 2011-12-20 LAB — COMPLETE METABOLIC PANEL WITH GFR
ALT: 19 U/L (ref 0–35)
AST: 19 U/L (ref 0–37)
Albumin: 4.7 g/dL (ref 3.5–5.2)
Alkaline Phosphatase: 96 U/L (ref 39–117)
BUN: 16 mg/dL (ref 6–23)
CO2: 26 mEq/L (ref 19–32)
Calcium: 10 mg/dL (ref 8.4–10.5)
Chloride: 103 mEq/L (ref 96–112)
Creat: 0.7 mg/dL (ref 0.50–1.10)
GFR, Est African American: >89 mL/min
GFR, Est Non African American: >89 mL/min
Glucose, Bld: 92 mg/dL (ref 70–99)
Potassium: 4.5 mEq/L (ref 3.5–5.3)
Sodium: 140 mEq/L (ref 135–145)
Total Bilirubin: 0.4 mg/dL (ref 0.3–1.2)
Total Protein: 7.2 g/dL (ref 6.0–8.3)

## 2012-08-24 ENCOUNTER — Emergency Department
Admission: EM | Admit: 2012-08-24 | Discharge: 2012-08-24 | Disposition: A | Discharge status: Home / Self Care | Payer: PRIVATE HEALTH INSURANCE | Source: Home / Self Care | Attending: Family Medicine | Admitting: Family Medicine

## 2012-08-24 DIAGNOSIS — N3000 Acute cystitis without hematuria: Secondary | ICD-10-CM

## 2012-08-24 DIAGNOSIS — R300 Dysuria: Secondary | ICD-10-CM

## 2012-08-24 DIAGNOSIS — Z23 Encounter for immunization: Secondary | ICD-10-CM

## 2012-08-24 DIAGNOSIS — R309 Painful micturition, unspecified: Secondary | ICD-10-CM

## 2012-08-24 LAB — POCT URINALYSIS DIP (MANUAL ENTRY)
Bilirubin, UA: NEGATIVE
Blood, UA: NEGATIVE
Glucose, UA: NEGATIVE
Ketones, POC UA: NEGATIVE
Leukocytes, UA: NEGATIVE
Nitrite, UA: NEGATIVE
Protein Ur, POC: NEGATIVE
Spec Grav, UA: 1.01 (ref 1.005–1.03)
Urobilinogen, UA: 0.2 (ref 0–1)
pH, UA: 5.5 (ref 5–8)

## 2012-08-24 MED ORDER — NITROFURANTOIN MONOHYD MACRO 100 MG PO CAPS: 100.0000 mg | ORAL_CAPSULE | Freq: Two times a day (BID) | ORAL | Status: DC

## 2012-08-24 MED ORDER — INFLUENZA VIRUS VACC SPLIT PF IM SUSP
0.5000 mL | Freq: Once | INTRAMUSCULAR | Status: AC
  Administered 2012-08-24: 0.5 mL via INTRAMUSCULAR

## 2012-08-24 NOTE — ED Notes (Addendum)
Diana Solis complains of burning during urination and urgency to urinate for 3 days. She also has low back and pelvic pain.

## 2012-08-24 NOTE — ED Provider Notes (Signed)
History     CSN: [id number]  Arrival date & time 08/24/12  1116   First MD Initiated Contact with Patient 08/24/12 1145      Chief Complaint  Patient presents with  . Dysuria     HPI Comments: Diana Solis complains of burning during urination and urgency to urinate for 3 days. She also has low back and pelvic pain.   She has past history of hysterectomy. She also requests flu immunization.  Patient is a 56 y.o. female presenting with dysuria. The history is provided by the patient.  Dysuria  This is a new problem. Episode onset: 3 days ago. The problem occurs every urination. The problem has been gradually worsening. The quality of the pain is described as burning. The pain is mild. There has been no fever. Associated symptoms include chills, frequency, hesitancy and urgency. Pertinent negatives include no sweats, no nausea, no vomiting, no discharge, no hematuria and no flank pain.    History reviewed. No pertinent past medical history.  Past Surgical History  Procedure Date  . Tonsillectomy   . Vaginal hysterectomy   . Incontinence surgery   . Breast cyst excision     Family History  Problem Relation Age of Onset  . Asthma Mother   . Lung cancer Father     smoker  . Lupus Father   . Thyroid disease Mother     History  Substance Use Topics  . Smoking status: Current Every Day Smoker -- 1.0 packs/day    Types: Cigarettes  . Smokeless tobacco: Not on file  . Alcohol Use: 3.0 oz/week    6 drink(s) per week    OB History    Grav Para Term Preterm Abortions TAB SAB Ect Mult Living                  Review of Systems  Constitutional: Positive for chills.  Gastrointestinal: Negative for nausea and vomiting.  Genitourinary: Positive for dysuria, hesitancy, urgency and frequency. Negative for hematuria and flank pain.  All other systems reviewed and are negative.    Allergies  Review of patient's allergies indicates no known allergies.  Home Medications   Current  Outpatient Rx  Name Route Sig Dispense Refill  . BIMATOPROST 0.03 % OP SOLN  1 drop at bedtime.      Marland Kitchen CETIRIZINE HCL 5 MG PO TABS Oral Take 5 mg by mouth daily.      Marland Kitchen NITROFURANTOIN MONOHYD MACRO 100 MG PO CAPS Oral Take 1 capsule (100 mg total) by mouth 2 (two) times daily. 14 capsule 0    BP 123/77  Pulse 90  Temp 97.9 F (36.6 C) (Oral)  Resp 16  Ht 5\' 4"  (1.626 m)  Wt 153 lb (69.4 kg)  BMI 26.26 kg/m2  SpO2 95%  Physical Exam Nursing notes and Vital Signs reviewed. Appearance:  Patient appears healthy, stated age, and in no acute distress Eyes:  Pupils are equal, round, and reactive to light and accomodation.  Extraocular movement is intact.  Conjunctivae are not inflamed  Pharynx:  Normal Neck:  Supple.  No adenopathy Lungs:  Clear to auscultation.  Breath sounds are equal.  Heart:  Regular rate and rhythm without murmurs, rubs, or gallops.  Abdomen:  Nontender without masses or hepatosplenomegaly.  Bowel sounds are present.  No CVA or flank tenderness.  Extremities:  No edema.  Skin:  No rash present.   ED Course  Procedures none   Labs Reviewed  POCT URINALYSIS DIP (MANUAL  ENTRY) - Normal  URINE CULTURE pending      1. Pain passing urine   2. Acute cystitis       MDM  Urine culture pending Begin Macrobid for one week.  Increase fluid intake. Flu immunization Followup with Family Doctor if not improved in one week.          Lattie Haw, MD 08/27/12 1620

## 2012-08-25 LAB — URINE CULTURE: Colony Count: 10000

## 2012-08-28 ENCOUNTER — Telehealth: Payer: Self-pay | Admitting: *Deleted

## 2012-12-17 ENCOUNTER — Encounter: Payer: Self-pay | Admitting: Family Medicine

## 2012-12-17 ENCOUNTER — Ambulatory Visit (INDEPENDENT_AMBULATORY_CARE_PROVIDER_SITE_OTHER): Payer: PRIVATE HEALTH INSURANCE | Admitting: Family Medicine

## 2012-12-17 VITALS — BP 127/82 | Temp 97.7°F | Wt 157.0 lb

## 2012-12-17 DIAGNOSIS — R05 Cough: Secondary | ICD-10-CM

## 2012-12-17 DIAGNOSIS — A499 Bacterial infection, unspecified: Secondary | ICD-10-CM

## 2012-12-17 DIAGNOSIS — R059 Cough, unspecified: Secondary | ICD-10-CM

## 2012-12-17 DIAGNOSIS — B9689 Other specified bacterial agents as the cause of diseases classified elsewhere: Secondary | ICD-10-CM

## 2012-12-17 DIAGNOSIS — J329 Chronic sinusitis, unspecified: Secondary | ICD-10-CM

## 2012-12-17 MED ORDER — HYDROCODONE-HOMATROPINE 5-1.5 MG/5ML PO SYRP: 5.0000 mL | ORAL_SOLUTION | Freq: Three times a day (TID) | ORAL | Status: DC | PRN

## 2012-12-17 MED ORDER — DOXYCYCLINE HYCLATE 100 MG PO TABS: ORAL_TABLET | ORAL | Status: AC

## 2012-12-17 NOTE — Progress Notes (Signed)
CC: Diana Solis is a 57 y.o. female is here for Sinusitis and left ear pain   Subjective: HPI:  Patient presents for nasal congestion and facial pain. Describes facial pain as below the eyes bilaterally, worse when lying down or with exertion. Describes this as moderate in severity. Presently daily basis. Complains of nasal congestion worsening a daily basis, described as thick and green. Moderate in severity. Nothing makes better or worse. Above symptoms have been present for 2 weeks. For the past week she had a accompanying productive cough is getting worse and a daily basis described as moderate in severity. Has tried Mucinex without improvement of symptoms. Cough is interfering with her sleep, worse when lying down. Denies hemoptysis but describes thick green sputum production. Subjective fevers. Denies confusion, motor sensory disturbances, hearing loss, dizziness, eye pain, sore throat, wheezing, shortness of breath, chest pain, nor rashes nor abdominal pain   Review Of Systems Outlined In HPI  Past Medical History  Diagnosis Date  . Urinary incontinence 10/19/2011  . DIVERTICULOSIS, COLON 12/10/2010    Qualifier: Diagnosis of  By: Orson Aloe MD, Tinnie Gens    . GLAUCOMA 09/29/2009    Qualifier: Diagnosis of  By: Linford Arnold MD, Aurora Mask History  Problem Relation Age of Onset  . Asthma Mother   . Lung cancer Father     smoker  . Lupus Father   . Thyroid disease Mother      History  Substance Use Topics  . Smoking status: Current Every Day Smoker -- 1.0 packs/day    Types: Cigarettes  . Smokeless tobacco: Not on file  . Alcohol Use: 3.0 oz/week    6 drink(s) per week     Objective: Filed Vitals:   12/17/12 1027  BP: 127/82  Temp: 97.7 F (36.5 C)    General: Alert and Oriented, No Acute Distress HEENT: Pupils equal, round, reactive to light. Conjunctivae clear.  External ears unremarkable, canals clear with intact TMs with appropriate landmarks.  Middle ear  appears open without effusion. Pink inferior turbinates.  Moist mucous membranes, pharynx without inflammation nor lesions.  Neck supple without palpable lymphadenopathy nor abnormal masses. Maxillary sinus tenderness to percussion bilaterally Lungs: Clear to auscultation bilaterally, no wheezing/ronchi/rales.  Comfortable work of breathing. Good air movement. Cardiac: Regular rate and rhythm. Normal S1/S2.  No murmurs, rubs, nor gallops.   Extremities: No peripheral edema.  Strong peripheral pulses.  Mental Status: No depression, anxiety, nor agitation. Skin: Warm and dry.  Assessment & Plan: Diana Solis was seen today for sinusitis and left ear pain.  Diagnoses and associated orders for this visit:  Bacterial sinusitis - doxycycline (VIBRA-TABS) 100 MG tablet; One by mouth twice a day for ten days.  Cough - HYDROcodone-homatropine (HYCODAN) 5-1.5 MG/5ML syrup; Take 5 mLs by mouth every 8 (eight) hours as needed for cough.    Bacterial sinusitis: Will start doxycycline regimen as patient also complaining of what may be mild bacterial bronchitis. Encouraged Mucinex products and saline washes as needed.Signs and symptoms requring emergent/urgent reevaluation were discussed with the patient. Cough: May use Hycodan to help with sleep, otherwise consider DayQuil products during the day  Return if symptoms worsen or fail to improve.

## 2013-02-03 ENCOUNTER — Ambulatory Visit (INDEPENDENT_AMBULATORY_CARE_PROVIDER_SITE_OTHER): Payer: PRIVATE HEALTH INSURANCE | Admitting: Family Medicine

## 2013-02-03 ENCOUNTER — Encounter: Payer: Self-pay | Admitting: Family Medicine

## 2013-02-03 VITALS — BP 105/56 | HR 72 | Temp 98.2°F | Wt 157.0 lb

## 2013-02-03 DIAGNOSIS — R059 Cough, unspecified: Secondary | ICD-10-CM

## 2013-02-03 DIAGNOSIS — B9689 Other specified bacterial agents as the cause of diseases classified elsewhere: Secondary | ICD-10-CM

## 2013-02-03 DIAGNOSIS — R05 Cough: Secondary | ICD-10-CM

## 2013-02-03 DIAGNOSIS — L0291 Cutaneous abscess, unspecified: Secondary | ICD-10-CM

## 2013-02-03 DIAGNOSIS — A499 Bacterial infection, unspecified: Secondary | ICD-10-CM

## 2013-02-03 DIAGNOSIS — J329 Chronic sinusitis, unspecified: Secondary | ICD-10-CM

## 2013-02-03 DIAGNOSIS — L0390 Cellulitis, unspecified: Secondary | ICD-10-CM

## 2013-02-03 MED ORDER — MUPIROCIN 2 % EX OINT: TOPICAL_OINTMENT | Freq: Three times a day (TID) | CUTANEOUS | Status: DC

## 2013-02-03 MED ORDER — LEVOFLOXACIN 750 MG PO TABS: 750.0000 mg | ORAL_TABLET | Freq: Every day | ORAL | Status: AC

## 2013-02-03 MED ORDER — HYDROCODONE-HOMATROPINE 5-1.5 MG/5ML PO SYRP: 5.0000 mL | ORAL_SOLUTION | Freq: Three times a day (TID) | ORAL | Status: DC | PRN

## 2013-02-03 MED ORDER — FLUTICASONE PROPIONATE 50 MCG/ACT NA SUSP: NASAL | Status: DC

## 2013-02-03 NOTE — Progress Notes (Signed)
CC: Diana Solis is a 57 y.o. female is here for Sinusitis   Subjective: HPI:  Patient complains of facial pressure and runny nose. Symptoms have been present for about 5 days, worsening on a daily basis. Pressures described as moderate to severe in severity, nothing makes it better or worse, localized below both eyes. Has been associated with a cough is interfering with sleep. Cough is worse in the evenings when lying down. Complains of subjective fevers and chills for the past 2 days. Interventions have included Zyrtec. She is gotten good response from [facility] in the past but does not have a prescript currently. Denies motor or sensory disturbances, headache other than above, vision disturbance, shortness of breath, wheezing, chest pain, confusion.  She has a rash on her left cheek that's been present for 3 days. It is slightly tender to the touch but otherwise not bothering her. She denies recent trauma no interventions as of yet   Review Of Systems Outlined In HPI  Past Medical History  Diagnosis Date  . Urinary incontinence 10/19/2011  . DIVERTICULOSIS, COLON 12/10/2010    Qualifier: Diagnosis of  By: Orson Aloe MD, Tinnie Gens    . GLAUCOMA 09/29/2009    Qualifier: Diagnosis of  By: Linford Arnold MD, Aurora Mask History  Problem Relation Age of Onset  . Asthma Mother   . Lung cancer Father     smoker  . Lupus Father   . Thyroid disease Mother      History  Substance Use Topics  . Smoking status: Current Every Day Smoker -- 1.00 packs/day    Types: Cigarettes  . Smokeless tobacco: Not on file  . Alcohol Use: 3.0 oz/week    6 drink(s) per week     Objective: Filed Vitals:   02/03/13 1615  BP: 105/56  Pulse: 72  Temp: 98.2 F (36.8 C)    General: Alert and Oriented, No Acute Distress HEENT: Pupils equal, round, reactive to light. Conjunctivae clear.  External ears unremarkable, canals clear with intact TMs with appropriate landmarks.  Middle ear appears open  without effusion. Pink inferior turbinates.  Moist mucous membranes, pharynx without inflammation nor lesions.  Neck supple without palpable lymphadenopathy nor abnormal masses. Maxillary sinus tenderness bilaterally Lungs: Clear to auscultation bilaterally, no wheezing/ronchi/rales.  Comfortable work of breathing. Good air movement. Cardiac: Regular rate and rhythm. Normal S1/S2.  No murmurs, rubs, nor gallops.   Extremities: No peripheral edema.  Strong peripheral pulses.  Mental Status: No depression, anxiety, nor agitation. Skin: Warm and dry. Diamond-shaped abrasion on left cheek with mild surrounding erythema and  Assessment & Plan: Diana Solis was seen today for sinusitis.  Diagnoses and associated orders for this visit:  Cellulitis - mupirocin ointment (BACTROBAN) 2 %; Apply topically 3 (three) times daily.  Bacterial sinusitis - levofloxacin (LEVAQUIN) 750 MG tablet; Take 1 tablet (750 mg total) by mouth daily. - fluticasone (FLONASE) 50 MCG/ACT nasal spray; Two sprays each nostril daily.  Cough - HYDROcodone-homatropine (HYCODAN) 5-1.5 MG/5ML syrup; Take 5 mLs by mouth every 8 (eight) hours as needed for cough.    Bacterial sinusitis: Was on doxycycline about a month ago, we'll set up therapy with respiratory flora quinolone. Start nasal steroid for any seasonal allergy component to her discomfort. Cough: Hycodan provided, most likely postnasal drip, continue daily Zyrtec Cellulitis of the cheek: Start daily application of Bactroban  Return if symptoms worsen or fail to improve.

## 2013-05-28 ENCOUNTER — Telehealth: Payer: Self-pay | Admitting: Family Medicine

## 2013-05-28 DIAGNOSIS — J329 Chronic sinusitis, unspecified: Secondary | ICD-10-CM

## 2013-05-28 DIAGNOSIS — B9689 Other specified bacterial agents as the cause of diseases classified elsewhere: Secondary | ICD-10-CM

## 2013-05-28 MED ORDER — FLUTICASONE PROPIONATE 50 MCG/ACT NA SUSP: NASAL | Status: DC

## 2013-05-28 NOTE — Telephone Encounter (Signed)
Patient called and request refill for Generic Flonase. Patient stated that pharmacy said she did not have anymore refills. Patient request a call back if she needs an appointment before she can get refilled or if refills are called in. Thanks

## 2013-05-28 NOTE — Telephone Encounter (Signed)
Refill sent.Diana Solis  

## 2013-06-27 ENCOUNTER — Ambulatory Visit (INDEPENDENT_AMBULATORY_CARE_PROVIDER_SITE_OTHER): Payer: PRIVATE HEALTH INSURANCE | Admitting: Family Medicine

## 2013-06-27 ENCOUNTER — Encounter: Payer: Self-pay | Admitting: Family Medicine

## 2013-06-27 VITALS — BP 110/68 | HR 69 | Temp 98.4°F | Wt 156.0 lb

## 2013-06-27 DIAGNOSIS — B9689 Other specified bacterial agents as the cause of diseases classified elsewhere: Secondary | ICD-10-CM

## 2013-06-27 DIAGNOSIS — R059 Cough, unspecified: Secondary | ICD-10-CM

## 2013-06-27 DIAGNOSIS — R05 Cough: Secondary | ICD-10-CM

## 2013-06-27 DIAGNOSIS — J329 Chronic sinusitis, unspecified: Secondary | ICD-10-CM

## 2013-06-27 DIAGNOSIS — A499 Bacterial infection, unspecified: Secondary | ICD-10-CM

## 2013-06-27 MED ORDER — HYDROCODONE-HOMATROPINE 5-1.5 MG/5ML PO SYRP: 5.0000 mL | ORAL_SOLUTION | Freq: Every evening | ORAL | Status: DC | PRN

## 2013-06-27 MED ORDER — FLUTICASONE PROPIONATE 50 MCG/ACT NA SUSP: NASAL | Status: DC

## 2013-06-27 MED ORDER — AMOXICILLIN-POT CLAVULANATE 875-125 MG PO TABS: 1.0000 | ORAL_TABLET | Freq: Two times a day (BID) | ORAL | Status: DC

## 2013-06-27 NOTE — Patient Instructions (Signed)

## 2013-06-27 NOTE — Progress Notes (Signed)
  Subjective:    Patient ID: Diana Solis, female    DOB: [birth year], 57 y.o.   MRN: [id number]  HPI Cough and nasal drainage 6-7 days.  Started with ST.  Dec appetite.  No diarrhea or vomiting.  + sick contacts.  Tried OTC cough med. Has had sme chills, bu no fever. Bilat maxillary sinus pain.    Review of Systems     Objective:   Physical Exam  Constitutional: She is oriented to person, place, and time. She appears well-developed and well-nourished.  HENT:  Head: Normocephalic and atraumatic.  Right Ear: External ear normal.  Left Ear: External ear normal.  Nose: Nose normal.  Mouth/Throat: Oropharynx is clear and moist.  TMs and canals are clear.   Eyes: Conjunctivae and EOM are normal. Pupils are equal, round, and reactive to light.  Neck: Neck supple. No thyromegaly present.  Cardiovascular: Normal rate, regular rhythm and normal heart sounds.   Pulmonary/Chest: Effort normal and breath sounds normal. She has no wheezes.  Lymphadenopathy:    She has no cervical adenopathy.  Neurological: She is alert and oriented to person, place, and time.  Skin: Skin is warm and dry.  Psychiatric: She has a normal mood and affect.          Assessment & Plan:  Sinusitis/bronchitis - Will tx w/ augmentin.  Make drink plenty of fluids. Call if not better in one week. H.O givne.

## 2013-08-13 ENCOUNTER — Ambulatory Visit: Payer: PRIVATE HEALTH INSURANCE

## 2013-08-14 ENCOUNTER — Ambulatory Visit: Payer: PRIVATE HEALTH INSURANCE | Admitting: *Deleted

## 2013-08-15 ENCOUNTER — Encounter: Payer: Self-pay | Admitting: Emergency Medicine

## 2013-08-15 ENCOUNTER — Emergency Department (INDEPENDENT_AMBULATORY_CARE_PROVIDER_SITE_OTHER)
Admission: EM | Admit: 2013-08-15 | Discharge: 2013-08-15 | Disposition: A | Discharge status: Home / Self Care | Payer: PRIVATE HEALTH INSURANCE | Source: Home / Self Care | Attending: Family Medicine | Admitting: Family Medicine

## 2013-08-15 DIAGNOSIS — S61209A Unspecified open wound of unspecified finger without damage to nail, initial encounter: Secondary | ICD-10-CM

## 2013-08-15 DIAGNOSIS — S61211A Laceration without foreign body of left index finger without damage to nail, initial encounter: Secondary | ICD-10-CM

## 2013-08-15 NOTE — ED Notes (Signed)
Cut on metal part of table umbrella, appears to be superficial

## 2013-08-15 NOTE — ED Notes (Signed)
Left index finger

## 2013-08-15 NOTE — ED Provider Notes (Signed)
CSN: [id number]     Arrival date & time 08/15/13  1107 History   First MD Initiated Contact with Patient 08/15/13 1230     Chief Complaint  Patient presents with  . Laceration      HPI Comments: Patient cut her left second fingertip on edge of metal pipe today.  She washed the wound with hydrogen peroxide.  Tetanus immunization is current.  Patient is a 57 y.o. female presenting with skin laceration. The history is provided by the patient.  Laceration Location:  Finger Finger laceration location:  L index finger Length (cm):  1 Depth:  Through dermis Quality: straight   Bleeding: controlled   Time since incident:  2 hours Laceration mechanism:  Metal edge Pain details:    Quality:  Aching   Severity:  Mild   Timing:  Constant   Progression:  Unchanged Foreign body present:  No foreign bodies Relieved by:  Nothing Worsened by:  Movement Tetanus status:  Up to date   Past Medical History  Diagnosis Date  . Urinary incontinence 10/19/2011  . DIVERTICULOSIS, COLON 12/10/2010    Qualifier: Diagnosis of  By: Orson Aloe MD, Tinnie Gens    . GLAUCOMA 09/29/2009    Qualifier: Diagnosis of  By: Linford Arnold MD, Santina Evans     Past Surgical History  Procedure Laterality Date  . Tonsillectomy    . Vaginal hysterectomy    . Incontinence surgery    . Breast cyst excision     Family History  Problem Relation Age of Onset  . Asthma Mother   . Lung cancer Father     smoker  . Lupus Father   . Thyroid disease Mother    History  Substance Use Topics  . Smoking status: Current Every Day Smoker -- 1.00 packs/day for 40 years    Types: Cigarettes  . Smokeless tobacco: Not on file  . Alcohol Use: 3.0 oz/week    6 drink(s) per week   OB History   Grav Para Term Preterm Abortions TAB SAB Ect Mult Living                 Review of Systems  All other systems reviewed and are negative.    Allergies  Review of patient's allergies indicates not on file.  Home Medications   Current  Outpatient Rx  Name  Route  Sig  Dispense  Refill  . amoxicillin-clavulanate (AUGMENTIN) 875-125 MG per tablet   Oral   Take 1 tablet by mouth 2 (two) times daily.   20 tablet   0   . bimatoprost (LUMIGAN) 0.03 % ophthalmic solution      1 drop at bedtime.           . cetirizine (ZYRTEC) 5 MG tablet   Oral   Take 5 mg by mouth daily.           . fluticasone (FLONASE) 50 MCG/ACT nasal spray      Two sprays each nostril daily.   16 g   3   . HYDROcodone-homatropine (HYCODAN) 5-1.5 MG/5ML syrup   Oral   Take 5 mL by mouth at bedtime as needed for cough.   120 mL   0   . mupirocin ointment (BACTROBAN) 2 %   Topical   Apply topically 3 (three) times daily.   22 g   0    BP 133/76  Pulse 85  Temp(Src) 98.1 F (36.7 C) (Oral)  Ht 5\' 4"  (1.626 m)  Wt 155 lb (70.308 kg)  BMI 26.59 kg/m2  SpO2 95% Physical Exam  Nursing note and vitals reviewed. Constitutional: She is oriented to person, place, and time. She appears well-developed and well-nourished. No distress.  HENT:  Head: Atraumatic.  Eyes: Conjunctivae are normal. Pupils are equal, round, and reactive to light.  Musculoskeletal: Normal range of motion.       Left hand: She exhibits no laceration.       Hands: Volar surface of left second fingertip has two linear superficial flap lacerations as noted on diagram:  One laceration 12mm by 3mm, and other laceration 8mm by 2mm.  Distal neurovascular function is intact.   Neurological: She is alert and oriented to person, place, and time.    ED Course  Procedures  Laceration Repair (Dermabond) Discussed benefits and risks of procedure and verbal consent obtained. Using sterile technique, cleansed wound with HibiClens followed by lavage with normal saline.  Wound carefully inspected for debris and foreign bodies; none found.  Wound edges carefully approximated in normal anatomic position and closed with Dermabond.  Wound precautions explained to patient.             MDM   1. Laceration of second finger, left, initial encounter     Keep wound clean and dry.  Return for any signs of infection as outlined in Dermabond information sheet.    Lattie Haw, MD 08/15/13 1346

## 2013-08-21 ENCOUNTER — Ambulatory Visit (INDEPENDENT_AMBULATORY_CARE_PROVIDER_SITE_OTHER): Payer: PRIVATE HEALTH INSURANCE | Admitting: Family Medicine

## 2013-08-21 DIAGNOSIS — Z23 Encounter for immunization: Secondary | ICD-10-CM

## 2013-08-21 NOTE — Progress Notes (Signed)
  Subjective:    Patient ID: Diana Solis, female    DOB: May 17, 1956, 57 y.o.   MRN: [id number]  HPI    Review of Systems     Objective:   Physical Exam        Assessment & Plan:  Here for flu shot

## 2013-12-09 ENCOUNTER — Encounter: Payer: Self-pay | Admitting: Sports Medicine

## 2013-12-09 ENCOUNTER — Ambulatory Visit (INDEPENDENT_AMBULATORY_CARE_PROVIDER_SITE_OTHER): Payer: PRIVATE HEALTH INSURANCE | Admitting: Sports Medicine

## 2013-12-09 VITALS — BP 135/72 | HR 77 | Ht 64.0 in | Wt 157.0 lb

## 2013-12-09 DIAGNOSIS — M7022 Olecranon bursitis, left elbow: Secondary | ICD-10-CM | POA: Insufficient documentation

## 2013-12-09 DIAGNOSIS — M7020 Olecranon bursitis, unspecified elbow: Secondary | ICD-10-CM

## 2013-12-09 NOTE — Patient Instructions (Signed)
Olecranon Bursitis Bursitis is swelling and soreness (inflammation) of a fluid-filled sac (bursa) that covers and protects a joint. Olecranon bursitis occurs over the elbow.  CAUSES Bursitis can be caused by injury, overuse of the joint, arthritis, or infection.  SYMPTOMS   Tenderness, swelling, warmth, or redness over the elbow.  Elbow pain with movement. This is greater with bending the elbow.  Squeaking sound when the bursa is rubbed or moved.  Increasing size of the bursa without pain or discomfort.  Fever with increasing pain and swelling if the bursa becomes infected. HOME CARE INSTRUCTIONS   Put ice on the affected area.  Put ice in a plastic bag.  Place a towel between your skin and the bag.  Leave the ice on for 15-20 minutes each hour while awake. Do this for the first 2 days.  When resting, elevate your elbow above the level of your heart. This helps reduce swelling.  Continue to put the joint through a full range of motion 4 times per day. Rest the injured joint at other times. When the pain lessens, begin normal slow movements and usual activities.  Only take over-the-counter or prescription medicines for pain, discomfort, or fever as directed by your caregiver.  Reduce your intake of milk and related dairy products (cheese, yogurt). They may make your condition worse. SEEK IMMEDIATE MEDICAL CARE IF:   Your pain increases even during treatment.  You have a fever.  You have heat and inflammation over the bursa and elbow.  You have a red line that goes up your arm.  You have pain with movement of your elbow. MAKE SURE YOU:   Understand these instructions.  Will watch your condition.  Will get help right away if you are not doing well or get worse. Document Released: 11/29/2006 Document Revised: 01/22/2012 Document Reviewed: 10/15/2007 Exit[employer] Patient Information 2014 [employer].  

## 2013-12-09 NOTE — Assessment & Plan Note (Signed)
Appears predominantly traumatic, no signs of a septic olecranon bursitis. Aspiration and injection as above. Strap with compressive dressing. Return to see me in one month we can do a total of 3 of these before considering surgical excision.

## 2013-12-09 NOTE — Progress Notes (Signed)
   Subjective:    I'm seeing this patient as a consultation for:  Dr. Linford ArnoldMetheney  CC: Left elbow mass  HPI: This is a very pleasant 58 year old female, for the past several weeks she's noted a swelling without trauma no redness, no fevers, chills, over her left elbow. It is moderate, persistent.  Past medical history, Surgical history, Family history not pertinant except as noted below, Social history, Allergies, and medications have been entered into the medical record, reviewed, and no changes needed.   Review of Systems: No headache, visual changes, nausea, vomiting, diarrhea, constipation, dizziness, abdominal pain, skin rash, fevers, chills, night sweats, weight loss, swollen lymph nodes, body aches, joint swelling, muscle aches, chest pain, shortness of breath, mood changes, visual or auditory hallucinations.   Objective:   General: Well Developed, well nourished, and in no acute distress.  Neuro/Psych: Alert and oriented x3, extra-ocular muscles intact, able to move all 4 extremities, sensation grossly intact. Skin: Warm and dry, no rashes noted.  Respiratory: Not using accessory muscles, speaking in full sentences, trachea midline.  Cardiovascular: Pulses palpable, no extremity edema. Abdomen: Does not appear distended. Left Elbow: Unremarkable to inspection. Range of motion full pronation, supination, flexion, extension. Strength is full to all of the above directions Stable to varus, valgus stress. Negative moving valgus stress test. No discrete areas of tenderness to palpation. Ulnar nerve does not sublux. Negative cubital tunnel Tinel's. There is a large visible and palpable olecranon bursitis without erythema, induration treated  Procedure: Real-time Ultrasound Guided aspiration/Injection of left elbow olecranon bursa Device: GE Logiq E  Verbal informed consent obtained.  Time-out conducted.  Noted no overlying erythema, induration, or other signs of local infection.    Skin prepped in a sterile fashion.  Local anesthesia: Topical Ethyl chloride.  With sterile technique and under real time ultrasound guidance:  22-gauge needle advanced real-time guidance into the olecranon bursa, approximate 13 cc of serosanguineous fluid was aspirated, syringe switched in 1 cc Kenalog 40, 1 cc lidocaine injected. Completed without difficulty  Pain immediately resolved suggesting accurate placement of the medication.  Advised to call if fevers/chills, erythema, induration, drainage, or persistent bleeding.  Images permanently stored and available for review in the ultrasound unit.  Impression: Technically successful ultrasound guided injection.  A compression wrap was applied to the left elbow.  Impression and Recommendations:   This case required medical decision making of moderate complexity.

## 2014-01-06 ENCOUNTER — Ambulatory Visit: Payer: PRIVATE HEALTH INSURANCE | Admitting: Sports Medicine

## 2014-01-21 ENCOUNTER — Emergency Department
Admission: EM | Admit: 2014-01-21 | Discharge: 2014-01-21 | Disposition: A | Discharge status: Home / Self Care | Payer: PRIVATE HEALTH INSURANCE | Source: Home / Self Care | Attending: Family Medicine | Admitting: Family Medicine

## 2014-01-21 ENCOUNTER — Encounter: Payer: Self-pay | Admitting: Emergency Medicine

## 2014-01-21 DIAGNOSIS — R059 Cough, unspecified: Secondary | ICD-10-CM

## 2014-01-21 DIAGNOSIS — R05 Cough: Secondary | ICD-10-CM

## 2014-01-21 DIAGNOSIS — R69 Illness, unspecified: Principal | ICD-10-CM

## 2014-01-21 DIAGNOSIS — J111 Influenza due to unidentified influenza virus with other respiratory manifestations: Secondary | ICD-10-CM

## 2014-01-21 MED ORDER — DOXYCYCLINE HYCLATE 100 MG PO CAPS: 100.0000 mg | ORAL_CAPSULE | Freq: Two times a day (BID) | ORAL | Status: DC

## 2014-01-21 MED ORDER — OSELTAMIVIR PHOSPHATE 75 MG PO CAPS: 75.0000 mg | ORAL_CAPSULE | Freq: Two times a day (BID) | ORAL | Status: DC

## 2014-01-21 MED ORDER — BENZONATATE 200 MG PO CAPS: 200.0000 mg | ORAL_CAPSULE | Freq: Every day | ORAL | Status: DC

## 2014-01-21 NOTE — Discharge Instructions (Signed)
Take plain Mucinex (1200 mg guaifenesin) twice daily for cough and congestion.  May add Sudafed for sinus congestion.   Increase fluid intake, rest. May use Afrin nasal spray (or generic oxymetazoline) twice daily for about 5 days.  Also recommend using saline nasal spray several times daily and saline nasal irrigation (AYR is a common brand) Try warm salt water gargles for sore throat.  Stop all antihistamines for now, and other non-prescription cough/cold preparations. May take Ibuprofen 200mg , 4 tabs every 8 hours with food for fever, body aches, headache, etc. May continue Flonase nasal spray   Follow-up with family doctor if not improving about 5 days..    Influenza, Adult Influenza ("the flu") is a viral infection of the respiratory tract. It occurs more often in winter months because people spend more time in close contact with one another. Influenza can make you feel very sick. Influenza easily spreads from person to person (contagious). CAUSES  Influenza is caused by a virus that infects the respiratory tract. You can catch the virus by breathing in droplets from an infected person's cough or sneeze. You can also catch the virus by touching something that was recently contaminated with the virus and then touching your mouth, nose, or eyes. SYMPTOMS  Symptoms typically last 4 to 10 days and may include:  Fever.  Chills.  Headache, body aches, and muscle aches.  Sore throat.  Chest discomfort and cough.  Poor appetite.  Weakness or feeling tired.  Dizziness.  Nausea or vomiting. DIAGNOSIS  Diagnosis of influenza is often made based on your history and a physical exam. A nose or throat swab test can be done to confirm the diagnosis. RISKS AND COMPLICATIONS You may be at risk for a more severe case of influenza if you smoke cigarettes, have diabetes, have chronic heart disease (such as heart failure) or lung disease (such as asthma), or if you have a weakened immune system.  Elderly people and pregnant women are also at risk for more serious infections. The most common complication of influenza is a lung infection (pneumonia). Sometimes, this complication can require emergency medical care and may be life-threatening. PREVENTION  An annual influenza vaccination (flu shot) is the best way to avoid getting influenza. An annual flu shot is now routinely recommended for all adults in the [place] TREATMENT  In mild cases, influenza goes away on its own. Treatment is directed at relieving symptoms. For more severe cases, your caregiver may prescribe antiviral medicines to shorten the sickness. Antibiotic medicines are not effective, because the infection is caused by a virus, not by bacteria. HOME CARE INSTRUCTIONS  Only take over-the-counter or prescription medicines for pain, discomfort, or fever as directed by your caregiver.  Use a cool mist humidifier to make breathing easier.  Get plenty of rest until your temperature returns to normal. This usually takes 3 to 4 days.  Drink enough fluids to keep your urine clear or pale yellow.  Cover your mouth and nose when coughing or sneezing, and wash your hands well to avoid spreading the virus.  Stay home from work or school until your fever has been gone for at least 1 full day. SEEK MEDICAL CARE IF:   You have chest pain or a deep cough that worsens or produces more mucus.  You have nausea, vomiting, or diarrhea. SEEK IMMEDIATE MEDICAL CARE IF:   You have difficulty breathing, shortness of breath, or your skin or nails turn bluish.  You have severe neck pain or stiffness.  You have a severe headache, facial pain, or earache.  You have a worsening or recurring fever.  You have nausea or vomiting that cannot be controlled. MAKE SURE YOU:  Understand these instructions.  Will watch your condition.  Will get help right away if you are not doing well or get worse. Document Released: 10/27/2000 Document Revised:  04/30/2012 Document Reviewed: 01/29/2012 [facility]ExitCare Patient Information 2014 [place], [place].

## 2014-01-21 NOTE — ED Provider Notes (Signed)
CSN: [id number]     Arrival date & time 01/21/14  0900 History   First MD Initiated Contact with Patient 01/21/14 [phone]     Chief Complaint  Patient presents with  . Shortness of Breath  . Fever  . Cough      HPI Comments: Patient became fatigued yesterday.  Yesterday she developed a productive cough, and chills/sweats in the evening.  Today she awoke with headache, myalgias, fever to 101, sinus congestion, and shortness of breath with activity.  Her husband was recently diagnosed with influenza. She has a history of frequent bronchitis.  She continues to smoke.  The history is provided by the patient.    Past Medical History  Diagnosis Date  . Urinary incontinence 10/19/2011  . DIVERTICULOSIS, COLON 12/10/2010    Qualifier: Diagnosis of  By: Orson Aloe MD, Tinnie Gens    . GLAUCOMA 09/29/2009    Qualifier: Diagnosis of  By: Linford Arnold MD, Santina Evans     Past Surgical History  Procedure Laterality Date  . Tonsillectomy    . Vaginal hysterectomy    . Incontinence surgery    . Breast cyst excision     Family History  Problem Relation Age of Onset  . Asthma Mother   . Lung cancer Father     smoker  . Lupus Father   . Thyroid disease Mother    History  Substance Use Topics  . Smoking status: Current Every Day Smoker -- 1.00 packs/day for 40 years    Types: Cigarettes  . Smokeless tobacco: Not on file  . Alcohol Use: 3.0 oz/week    6 drink(s) per week   OB History   Grav Para Term Preterm Abortions TAB SAB Ect Mult Living                 Review of Systems + sore throat + cough No pleuritic pain No wheezing + nasal congestion + post-nasal drainage No sinus pain/pressure No itchy/red eyes ? Left earache No hemoptysis + SOB + fever, + chills No nausea No vomiting No abdominal pain No diarrhea No urinary symptoms No skin rash + fatigue + myalgias + headache Used OTC meds without relief  Allergies  Review of patient's allergies indicates no known  allergies.  Home Medications   Current Outpatient Rx  Name  Route  Sig  Dispense  Refill  . mupirocin ointment (BACTROBAN) 2 %   Nasal   Place 1 application into the nose 2 (two) times daily.         . benzonatate (TESSALON) 200 MG capsule   Oral   Take 1 capsule (200 mg total) by mouth at bedtime. Take as needed for cough   12 capsule   0   . bimatoprost (LUMIGAN) 0.03 % ophthalmic solution      1 drop at bedtime.           . cetirizine (ZYRTEC) 5 MG tablet   Oral   Take 5 mg by mouth daily.           Marland Kitchen doxycycline (VIBRAMYCIN) 100 MG capsule   Oral   Take 1 capsule (100 mg total) by mouth 2 (two) times daily.   20 capsule   0   . fluticasone (FLONASE) 50 MCG/ACT nasal spray      Two sprays each nostril daily.   16 g   3   . oseltamivir (TAMIFLU) 75 MG capsule   Oral   Take 1 capsule (75 mg total) by mouth every 12 (twelve)  hours.   10 capsule   0    BP 116/67  Pulse 107  Temp(Src) 99.3 F (37.4 C) (Oral)  Resp 20  Ht 5\' 4"  (1.626 m)  Wt 157 lb (71.215 kg)  BMI 26.94 kg/m2  SpO2 95% Physical Exam Nursing notes and Vital Signs reviewed. Appearance:  Patient appears stated age, and in no acute distress.  She is alert and oriented Eyes:  Pupils are equal, round, and reactive to light and accomodation.  Extraocular movement is intact.  Conjunctivae are not inflamed  Ears:  Canals normal.  Tympanic membranes normal.  Nose:  Congested turbinates.  No sinus tenderness.  Pharynx:  Normal Neck:  Supple.   Tender enlarged posterior nodes are palpated bilaterally  Lungs:   Breath sounds somewhat decreased but otherwise clear.  Breath sounds are equal.  Heart:  Regular rate and rhythm without murmurs, rubs, or gallops.  Abdomen:  Nontender without masses or hepatosplenomegaly.  Bowel sounds are present.  No CVA or flank tenderness.  Extremities:  No edema.  No calf tenderness Skin:  No rash present.   ED Course  Procedures        MDM   1.  Influenza-like illness    Begin Tamiflu.  Prescription written for Benzonatate [facility](Tessalon) to take at bedtime for night-time cough. With a history of frequent bronchitis, will start empiric doxycycline  Take plain Mucinex (1200 mg guaifenesin) twice daily for cough and congestion.  May add Sudafed for sinus congestion.   Increase fluid intake, rest. May use Afrin nasal spray (or generic oxymetazoline) twice daily for about 5 days.  Also recommend using saline nasal spray several times daily and saline nasal irrigation (AYR is a common brand) Try warm salt water gargles for sore throat.  Stop all antihistamines for now, and other non-prescription cough/cold preparations. May take Ibuprofen 200mg , 4 tabs every 8 hours with food for fever, body aches, headache, etc. May continue Flonase nasal spray If symptoms become significantly worse during the night or over the weekend, proceed to the local emergency room.    Follow-up with family doctor if not improving about 5 days.Lattie Haw.    Tehran Rabenold A Torri Langston, MD 01/21/14 [phone]

## 2014-01-21 NOTE — ED Notes (Signed)
Pt c/o productive cough, SOB, HA, and fever x last night. She reports that her husband was recently dx with the flu.

## 2014-01-23 ENCOUNTER — Telehealth: Payer: Self-pay | Admitting: *Deleted

## 2014-02-18 ENCOUNTER — Encounter: Payer: Self-pay | Admitting: Sports Medicine

## 2014-02-18 ENCOUNTER — Ambulatory Visit (INDEPENDENT_AMBULATORY_CARE_PROVIDER_SITE_OTHER): Payer: PRIVATE HEALTH INSURANCE | Admitting: Sports Medicine

## 2014-02-18 VITALS — BP 113/68 | HR 86 | Ht 64.0 in | Wt 155.0 lb

## 2014-02-18 DIAGNOSIS — M7022 Olecranon bursitis, left elbow: Secondary | ICD-10-CM

## 2014-02-18 DIAGNOSIS — M7020 Olecranon bursitis, unspecified elbow: Secondary | ICD-10-CM

## 2014-02-18 NOTE — Assessment & Plan Note (Signed)
This is recurrent after about 3 months. Aspiration and injection, fluid sent off for analysis. Return to see me in one month. Elbow was strapped with compressive dressing.

## 2014-02-18 NOTE — Progress Notes (Signed)
  Subjective:    CC:  Recheck olecranon bursitis recurrence  HPI: Approximately 3 months ago we aspirated and injected the left olecranon bursa, as did well, but unfortunately she has a recurrence, minimally tender, no swelling, no constitutional symptoms, no redness. Pain is mild, persistent.  Past medical history, Surgical history, Family history not pertinant except as noted below, Social history, Allergies, and medications have been entered into the medical record, reviewed, and no changes needed.   Review of Systems: No fevers, chills, night sweats, weight loss, chest pain, or shortness of breath.   Objective:    General: Well Developed, well nourished, and in no acute distress.  Neuro: Alert and oriented x3, extra-ocular muscles intact, sensation grossly intact.  HEENT: Normocephalic, atraumatic, pupils equal round reactive to light, neck supple, no masses, no lymphadenopathy, thyroid nonpalpable.  Skin: Warm and dry, no rashes. Cardiac: Regular rate and rhythm, no murmurs rubs or gallops, no lower extremity edema.  Respiratory: Clear to auscultation bilaterally. Not using accessory muscles, speaking in full sentences. Left elbow: There is a recurrence of olecranon bursitis that is fluctuant without erythema and only minimal tenderness to palpation.  Procedure: Real-time Ultrasound Guided aspiration/Injection of left olecranon bursa  Device: GE Logiq E  Verbal informed consent obtained.  Time-out conducted.  Noted no overlying erythema, induration, or other signs of local infection.  Skin prepped in a sterile fashion.  Local anesthesia: Topical Ethyl chloride.  With sterile technique and under real time ultrasound guidance:  10 cc of serosanguineous fluid aspirated, syringe with 1 cc Kenalog-40, 2 cc lidocaine injected easily. Completed without difficulty  Pain immediately resolved suggesting accurate placement of the medication.  Advised to call if fevers/chills, erythema,  induration, drainage, or persistent bleeding.  Images permanently stored and available for review in the ultrasound unit.  Impression: Technically successful ultrasound guided injection.  Impression and Recommendations:

## 2014-02-19 LAB — SYNOVIAL CELL COUNT + DIFF, W/ CRYSTALS
Crystals, Fluid: NONE SEEN
Eosinophils-Synovial: 0 % (ref 0–1)
Lymphocytes-Synovial Fld: 14 % (ref 0–20)
Monocyte/Macrophage: 85 % (ref 50–90)
Neutrophil, Synovial: 1 % (ref 0–25)
WBC, Synovial: 845 cu mm — ABNORMAL HIGH (ref 0–200)

## 2014-02-22 LAB — BODY FLUID CULTURE
Gram Stain: NONE SEEN
Gram Stain: NONE SEEN
Organism ID, Bacteria: NO GROWTH

## 2014-03-20 ENCOUNTER — Ambulatory Visit: Payer: PRIVATE HEALTH INSURANCE | Admitting: Sports Medicine

## 2014-03-23 ENCOUNTER — Encounter: Payer: Self-pay | Admitting: Sports Medicine

## 2014-03-23 ENCOUNTER — Ambulatory Visit (INDEPENDENT_AMBULATORY_CARE_PROVIDER_SITE_OTHER): Payer: PRIVATE HEALTH INSURANCE | Admitting: Sports Medicine

## 2014-03-23 ENCOUNTER — Ambulatory Visit (INDEPENDENT_AMBULATORY_CARE_PROVIDER_SITE_OTHER): Payer: PRIVATE HEALTH INSURANCE

## 2014-03-23 VITALS — BP 128/68 | HR 76 | Ht 64.0 in | Wt 156.0 lb

## 2014-03-23 DIAGNOSIS — J209 Acute bronchitis, unspecified: Secondary | ICD-10-CM

## 2014-03-23 DIAGNOSIS — M7022 Olecranon bursitis, left elbow: Secondary | ICD-10-CM

## 2014-03-23 DIAGNOSIS — J438 Other emphysema: Secondary | ICD-10-CM

## 2014-03-23 DIAGNOSIS — M7020 Olecranon bursitis, unspecified elbow: Secondary | ICD-10-CM

## 2014-03-23 DIAGNOSIS — F17200 Nicotine dependence, unspecified, uncomplicated: Secondary | ICD-10-CM

## 2014-03-23 DIAGNOSIS — J449 Chronic obstructive pulmonary disease, unspecified: Secondary | ICD-10-CM | POA: Insufficient documentation

## 2014-03-23 IMAGING — CR DG CHEST 2V
3 series · 3 of 3 positions shown · non-contrast
Comparison: None.

CLINICAL DATA: Dizziness, expiratory wheezes, history of smoking

EXAM:
CHEST  2 VIEW

[view not recorded (1 of 3)]
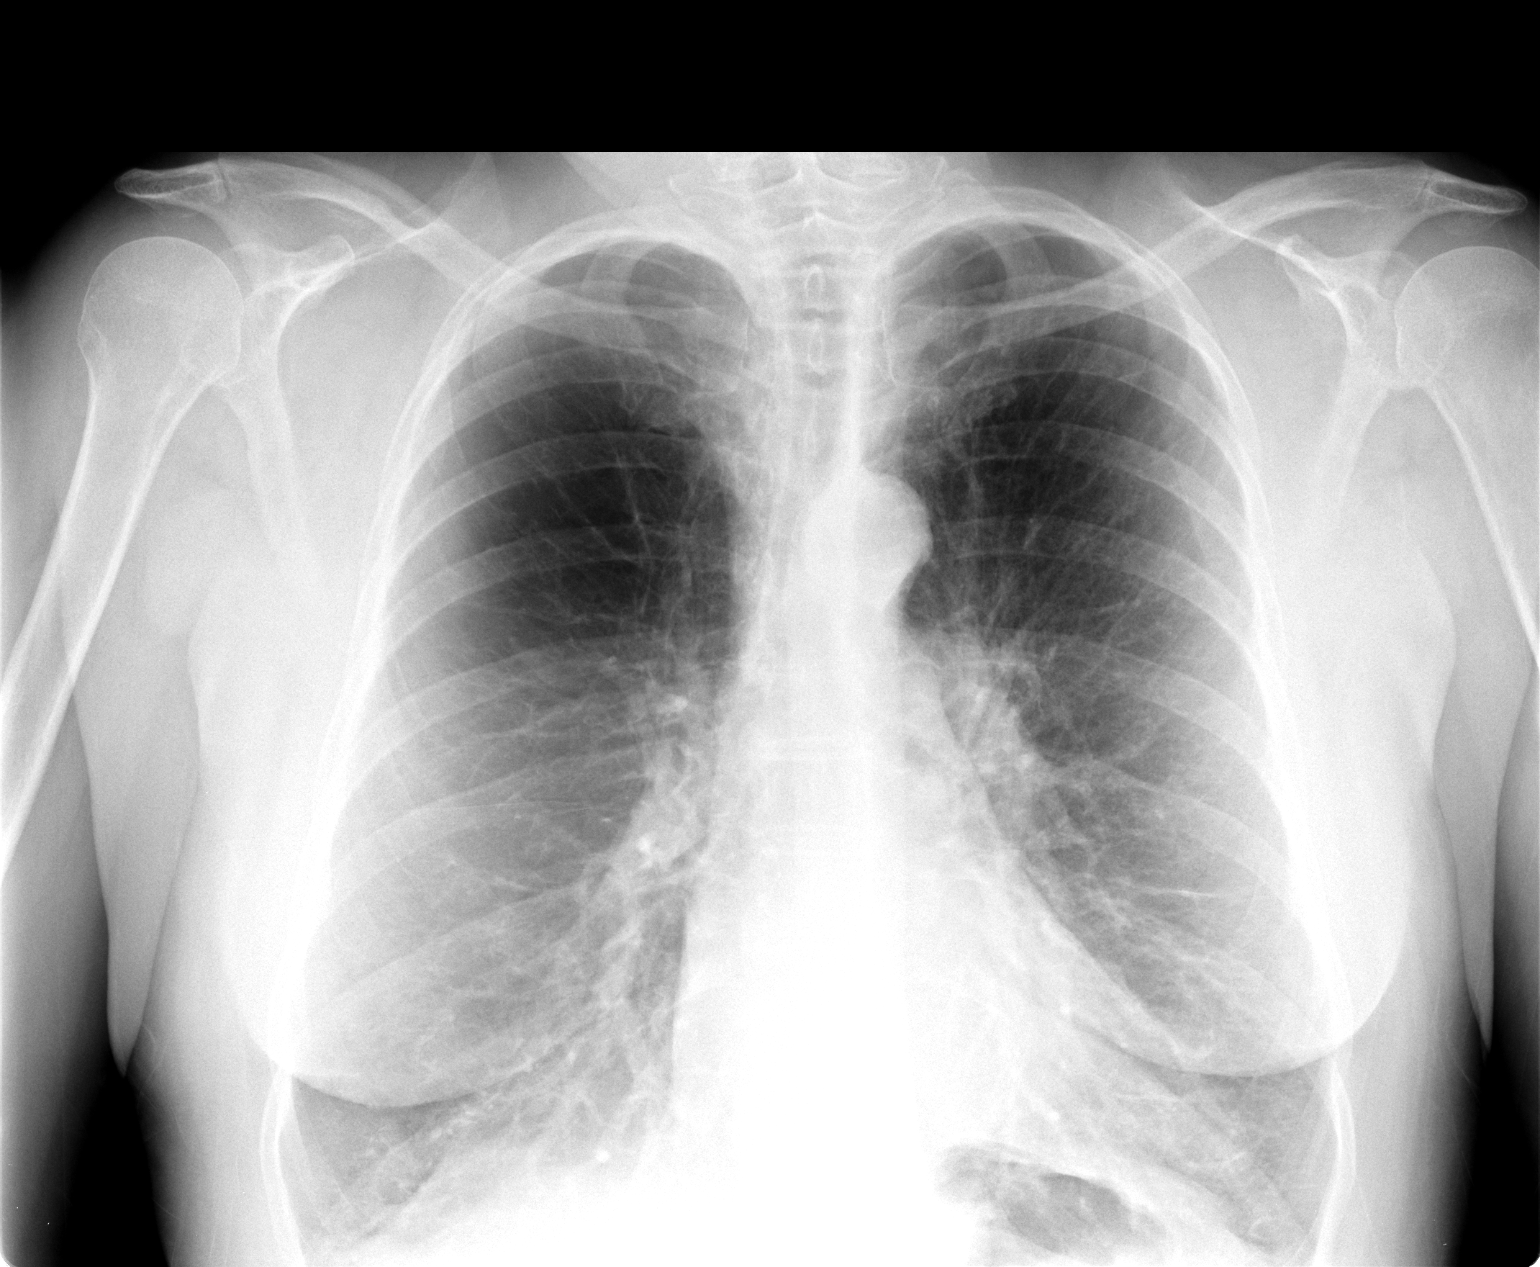

[view not recorded (2 of 3)]
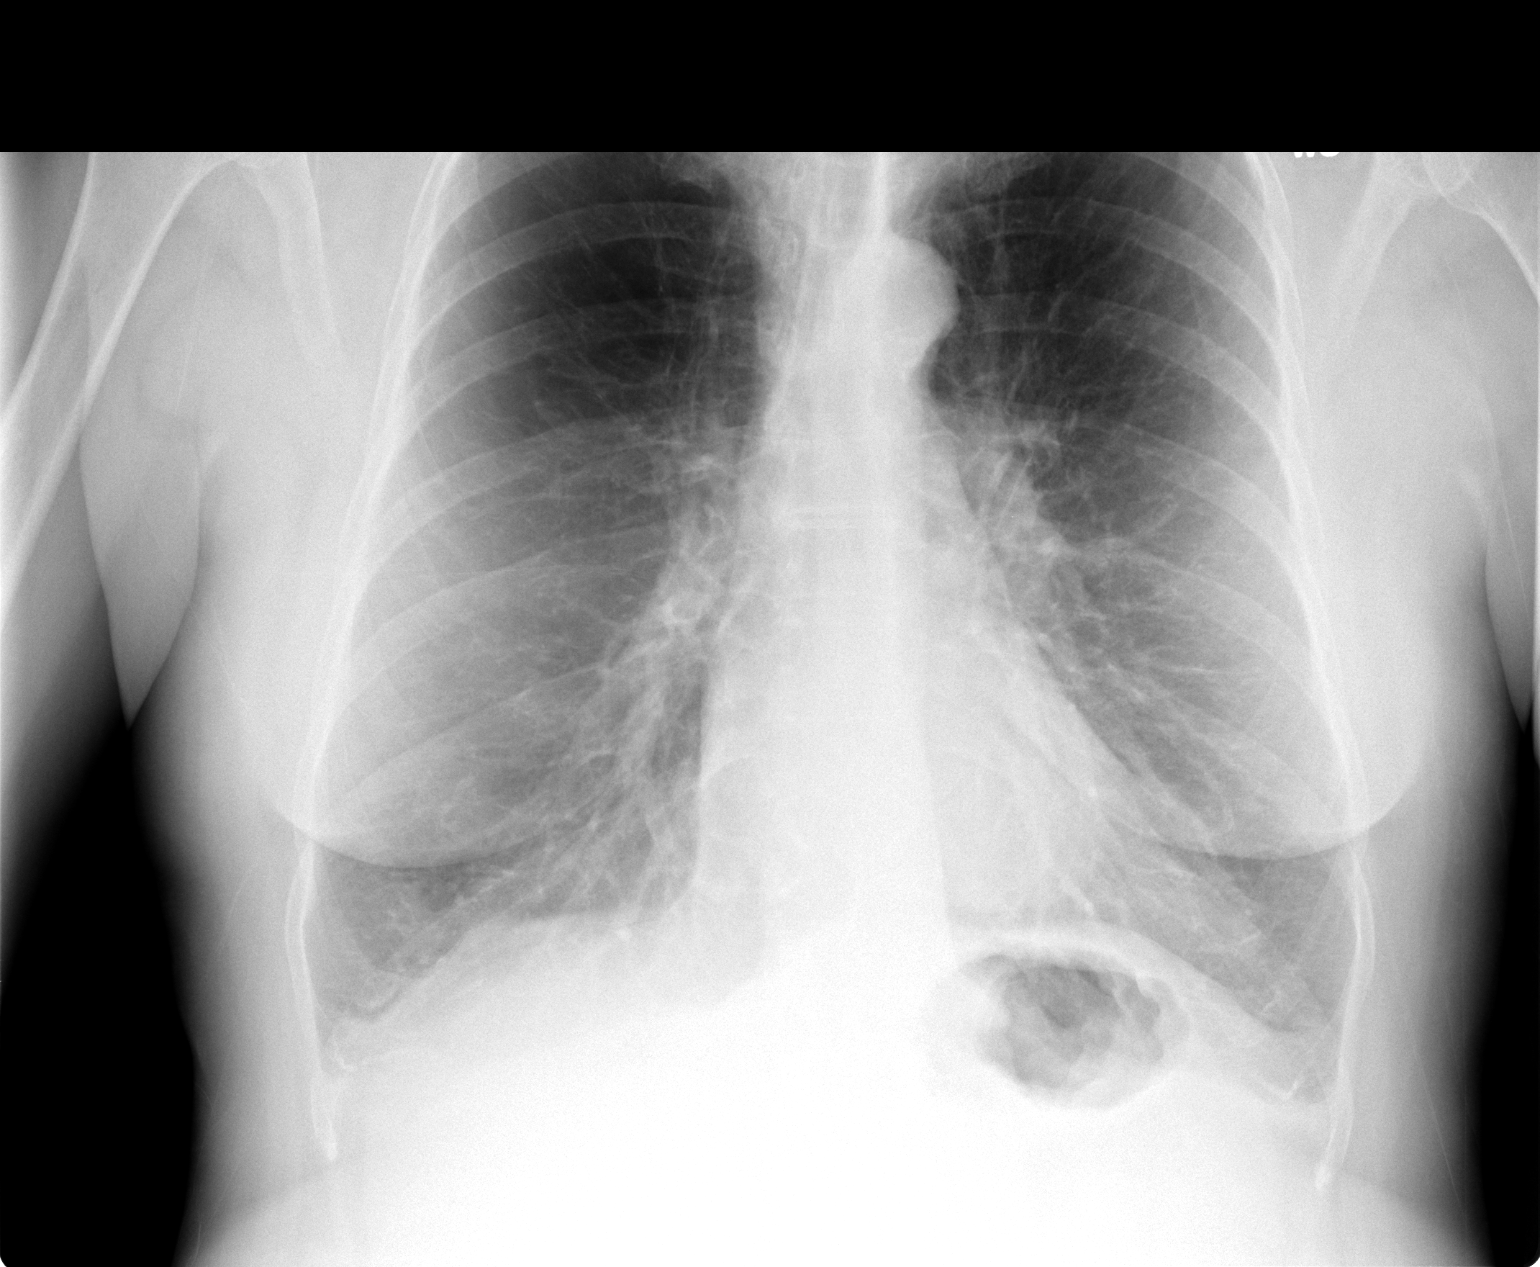

[view not recorded (3 of 3)]
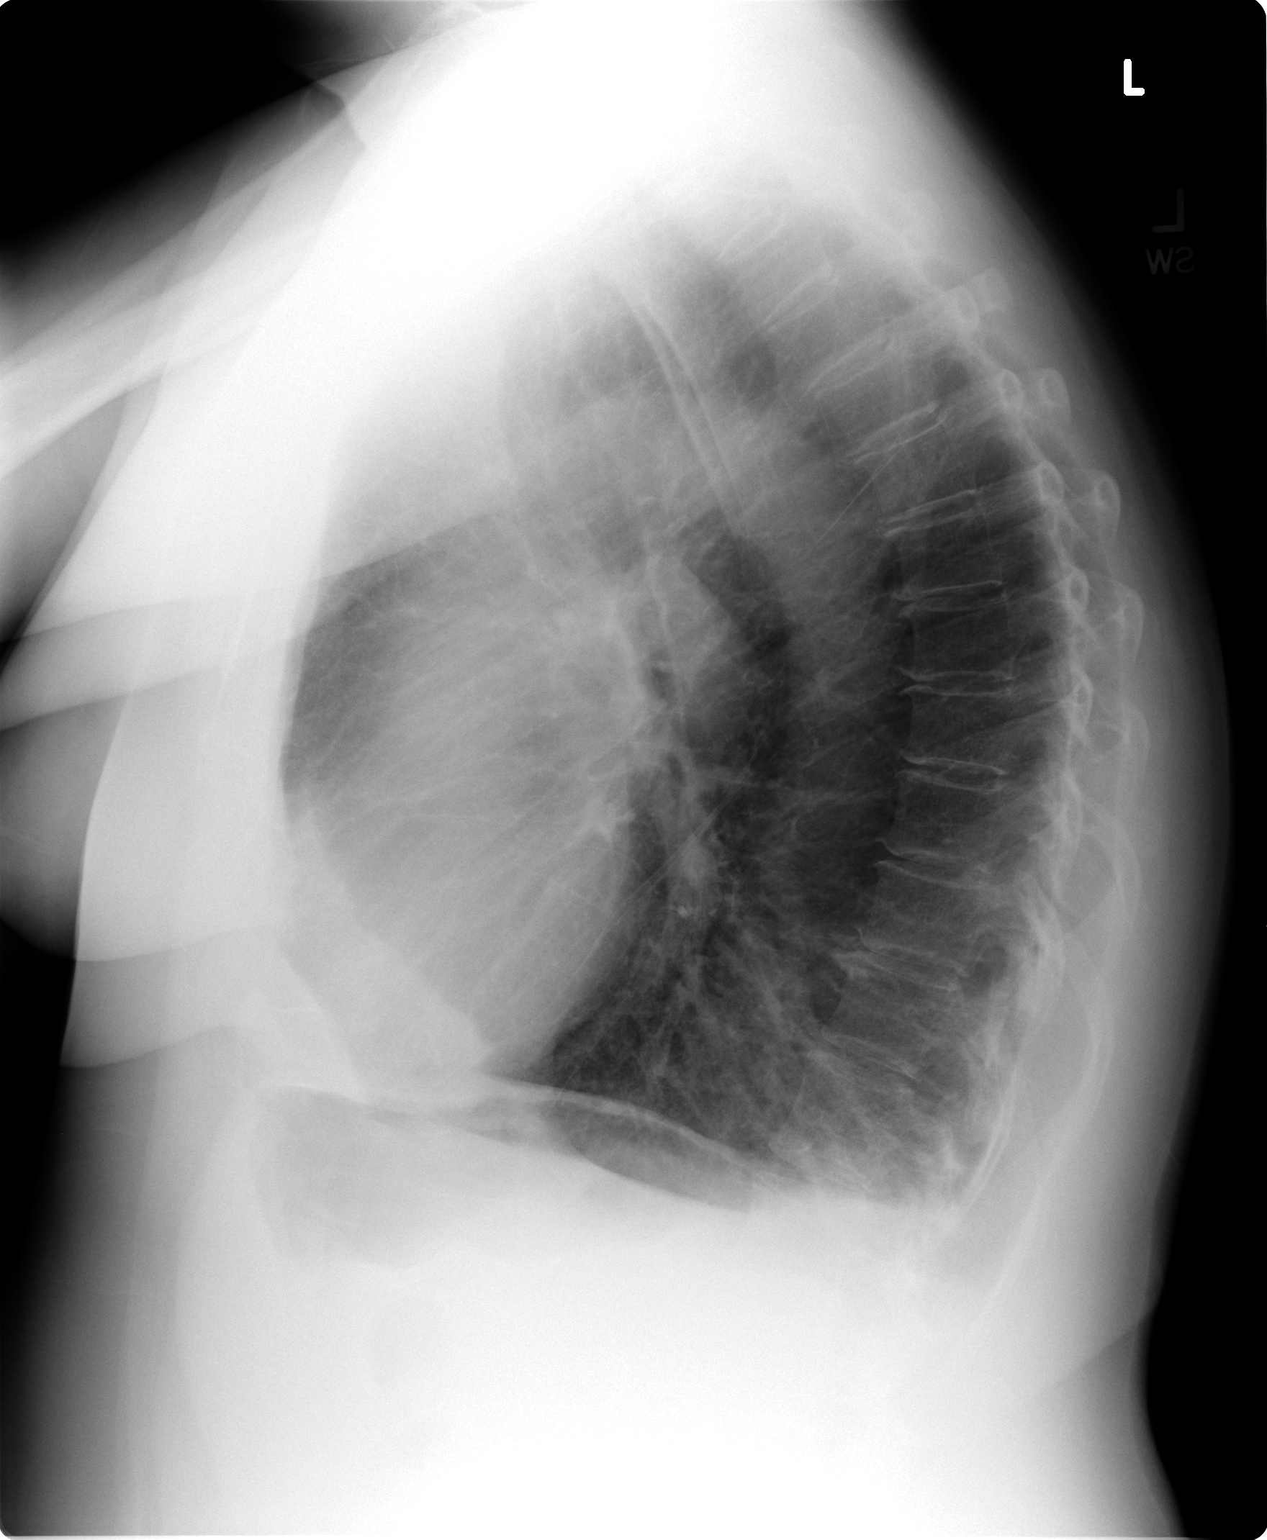

[3 of 3 positions shown; findings below may reference images not displayed]

FINDINGS: The lungs are very hyperaerated with flattened hemidiaphragms and
increased A-P diameter consistent with emphysema. No infiltrate or
effusion is noted. Mediastinal and hilar contours are unremarkable
and the heart is within normal limits in size. There are mild
degenerative changes in the thoracic spine.
IMPRESSION: Emphysema.  No active lung disease.

## 2014-03-23 MED ORDER — DOXYCYCLINE HYCLATE 100 MG PO TABS: 100.0000 mg | ORAL_TABLET | Freq: Two times a day (BID) | ORAL | Status: AC

## 2014-03-23 MED ORDER — ALBUTEROL SULFATE HFA 108 (90 BASE) MCG/ACT IN AERS: 2.0000 | INHALATION_SPRAY | Freq: Four times a day (QID) | RESPIRATORY_TRACT | Status: DC | PRN

## 2014-03-23 MED ORDER — PREDNISONE 50 MG PO TABS: 50.0000 mg | ORAL_TABLET | Freq: Every day | ORAL | Status: DC

## 2014-03-23 NOTE — Assessment & Plan Note (Signed)
Resolved after second aspiration and injection.

## 2014-03-23 NOTE — Assessment & Plan Note (Addendum)
With wheezing and coarse right-sided lung sounds and a smoker. There is likely an element of COPD, prednisone, doxycycline, chest x-ray, albuterol. Aggressive hydration. Return to see PCP next week. X-rays do show emphysematous changes

## 2014-03-23 NOTE — Progress Notes (Signed)
  Subjective:    CC: Followup and not feeling well  HPI: Left olecranon bursitis: Now completely resolved after 2 aspirations and injections.  Not feeling well : feels weak, somewhat lightheaded and presyncopal, no shortness of breath, chest pain, visual changes, diaphoresis, nausea, no GI symptoms. She does smoke a pack a day.  Past medical history, Surgical history, Family history not pertinant except as noted below, Social history, Allergies, and medications have been entered into the medical record, reviewed, and no changes needed.   Review of Systems: No fevers, chills, night sweats, weight loss, chest pain, or shortness of breath.   Objective:    General: Well Developed, well nourished, and in no acute distress.  Neuro: Alert and oriented x3, extra-ocular muscles intact, sensation grossly intact.  HEENT: Normocephalic, atraumatic, pupils equal round reactive to light, neck supple, no masses, no lymphadenopathy, thyroid nonpalpable.  Skin: Warm and dry, no rashes. Cardiac: Regular rate and rhythm, no murmurs rubs or gallops, no lower extremity edema.  Respiratory: Coarse sounds in the right middle lobe with expiratory wheezes. Not using accessory muscles, speaking in full sentences. Left Elbow: Unremarkable to inspection. Mild hyperpigmentation over the olecranon. Range of motion full pronation, supination, flexion, extension. Strength is full to all of the above directions Stable to varus, valgus stress. Negative moving valgus stress test. No discrete areas of tenderness to palpation. Ulnar nerve does not sublux. Negative cubital tunnel Tinel's.  X-ray does show bronchitic changes.  Impression and Recommendations:

## 2014-03-31 ENCOUNTER — Encounter: Payer: Self-pay | Admitting: Family Medicine

## 2014-03-31 ENCOUNTER — Ambulatory Visit: Payer: PRIVATE HEALTH INSURANCE | Admitting: Sports Medicine

## 2014-03-31 ENCOUNTER — Ambulatory Visit (INDEPENDENT_AMBULATORY_CARE_PROVIDER_SITE_OTHER): Payer: PRIVATE HEALTH INSURANCE | Admitting: Family Medicine

## 2014-03-31 VITALS — BP 129/77 | HR 91 | Wt 154.0 lb

## 2014-03-31 DIAGNOSIS — Z23 Encounter for immunization: Secondary | ICD-10-CM

## 2014-03-31 DIAGNOSIS — J209 Acute bronchitis, unspecified: Secondary | ICD-10-CM

## 2014-03-31 DIAGNOSIS — J438 Other emphysema: Secondary | ICD-10-CM

## 2014-03-31 DIAGNOSIS — J439 Emphysema, unspecified: Secondary | ICD-10-CM

## 2014-03-31 DIAGNOSIS — F17200 Nicotine dependence, unspecified, uncomplicated: Secondary | ICD-10-CM

## 2014-03-31 DIAGNOSIS — Z720 Tobacco use: Secondary | ICD-10-CM

## 2014-03-31 MED ORDER — ALBUTEROL SULFATE (2.5 MG/3ML) 0.083% IN NEBU
2.5000 mg | INHALATION_SOLUTION | Freq: Once | RESPIRATORY_TRACT | Status: AC
  Administered 2014-03-31: 2.5 mg via RESPIRATORY_TRACT

## 2014-03-31 MED ORDER — UMECLIDINIUM-VILANTEROL 62.5-25 MCG/INH IN AEPB: 1.0000 | INHALATION_SPRAY | Freq: Every day | RESPIRATORY_TRACT | Status: DC

## 2014-03-31 NOTE — Patient Instructions (Signed)
Varenicline oral tablets What is this medicine? VARENICLINE (var EN i kleen) is used to help people quit smoking. It can reduce the symptoms caused by stopping smoking. It is used with a patient support program recommended by your physician. This medicine may be used for other purposes; ask your health care provider or pharmacist if you have questions. COMMON BRAND NAME(S): Chantix What should I tell my health care provider before I take this medicine? They need to know if you have any of these conditions: -bipolar disorder, depression, schizophrenia or other mental illness -heart disease -if you often drink alcohol -kidney disease -peripheral vascular disease -seizures -stroke -suicidal thoughts, plans, or attempt; a previous suicide attempt by you or a family member -an unusual or allergic reaction to varenicline, other medicines, foods, dyes, or preservatives -pregnant or trying to get pregnant -breast-feeding How should I use this medicine? You should set a date to stop smoking and tell your doctor. Start this medicine one week before the quit date. You can also start taking this medicine before you choose a quit date, and then pick a quit date that is between 8 and 35 days of treatment with this medicine. Stick to your plan; ask about support groups or other ways to help you remain a 'quitter'. Take this medicine by mouth after eating. Take with a full glass of water. Follow the directions on the prescription label. Take your doses at regular intervals. Do not take your medicine more often than directed. A special MedGuide will be given to you by the pharmacist with each prescription and refill. Be sure to read this information carefully each time. Talk to your pediatrician regarding the use of this medicine in children. This medicine is not approved for use in children. Overdosage: If you think you have taken too much of this medicine contact a poison control center or emergency room at  once. NOTE: This medicine is only for you. Do not share this medicine with others. What if I miss a dose? If you miss a dose, take it as soon as you can. If it is almost time for your next dose, take only that dose. Do not take double or extra doses. What may interact with this medicine? -alcohol or any product that contains alcohol -insulin -other stop smoking aids -theophylline -warfarin This list may not describe all possible interactions. Give your health care provider a list of all the medicines, herbs, non-prescription drugs, or dietary supplements you use. Also tell them if you smoke, drink alcohol, or use illegal drugs. Some items may interact with your medicine. What should I watch for while using this medicine? Visit your doctor or health care professional for regular check ups. Ask for ongoing advice and encouragement from your doctor or healthcare professional, friends, and family to help you quit. If you smoke while on this medication, quit again Your mouth may get dry. Chewing sugarless gum or sucking hard candy, and drinking plenty of water may help. Contact your doctor if the problem does not go away or is severe. You may get drowsy or dizzy. Do not drive, use machinery, or do anything that needs mental alertness until you know how this medicine affects you. Do not stand or sit up quickly, especially if you are an older patient. This reduces the risk of dizzy or fainting spells. The use of this medicine may increase the chance of suicidal thoughts or actions. Pay special attention to how you are responding while on this medicine. Any worsening of   mood, or thoughts of suicide or dying should be reported to your health care professional right away. What side effects may I notice from receiving this medicine? Side effects that you should report to your doctor or health care professional as soon as possible: -allergic reactions like skin rash, itching or hives, swelling of the face,  lips, tongue, or throat -breathing problems -changes in vision -chest pain or chest tightness -confusion, trouble speaking or understanding -fast, irregular heartbeat -feeling faint or lightheaded, falls -fever -pain in legs when walking -problems with balance, talking, walking -redness, blistering, peeling or loosening of the skin, including inside the mouth -ringing in ears -seizures -sudden numbness or weakness of the face, arm or leg -suicidal thoughts or other mood changes -trouble passing urine or change in the amount of urine -unusual bleeding or bruising -unusually weak or tired Side effects that usually do not require medical attention (report to your doctor or health care professional if they continue or are bothersome): -constipation -headache -nausea, vomiting -strange dreams -stomach gas -trouble sleeping This list may not describe all possible side effects. Call your doctor for medical advice about side effects. You may report side effects to FDA at 1-800-FDA-1088. Where should I keep my medicine? Keep out of the reach of children. Store at room temperature between 15 and 30 degrees C (59 and 86 degrees F). Throw away any unused medicine after the expiration date. NOTE: This sheet is a summary. It may not cover all possible information. If you have questions about this medicine, talk to your doctor, pharmacist, or health care provider.  2014, [employer]vier/Gold Standard. (2013-08-11 13:37:47)  

## 2014-03-31 NOTE — Progress Notes (Signed)
Subjective:    Patient ID: Diana BickerLesa Solis, female    DOB: Feb 15, 1956, 58 y.o.   MRN: [id number]  HPI Seen for bronchitis about a week ago. She was 2 with prednisone and doxycycline. She is feeling better today. She did have a chest x-ray performed which showed some evidence of emphysema. She has no prior history of COPD or lung disease. But she is a one pack-a-day smoker.wheezing is better. Has been in using her albuterol BID and she feels it does help. No fever. She has cut back to half a pack a day smoking since she's been sick this week. She is interested in discussing options to quit.  She has never had spirometry done before or any lung testing. She does remember coughing a lot and having bronchitis frequently as a child. In fact when you're she said she missed and the 6 weeks of school because of it. She grew up around secondhand smoke in her mother smoked while she was pregnant.  Note, her sister is here for the visit today as well. Review of Systems  BP 129/77  Pulse 91  Wt 154 lb (69.854 kg)  SpO2 95%    No Known Allergies  Past Medical History  Diagnosis Date  . Urinary incontinence 10/19/2011  . DIVERTICULOSIS, COLON 12/10/2010    Qualifier: Diagnosis of  By: Orson AloeHenderson MD, Tinnie GensJeffrey    . GLAUCOMA 09/29/2009    Qualifier: Diagnosis of  By: Linford ArnoldMetheney MD, Santina Evansatherine      Past Surgical History  Procedure Laterality Date  . Tonsillectomy    . Vaginal hysterectomy    . Incontinence surgery    . Breast cyst excision      History   Social History  . Marital Status: Married    Spouse Name: Darrel.     Number of Children: 1  . Years of Education: N/A   Occupational History  . Not on file.   Social History Main Topics  . Smoking status: Current Every Day Smoker -- 1.00 packs/day for 40 years    Types: Cigarettes  . Smokeless tobacco: Not on file  . Alcohol Use: 3.0 oz/week    6 drink(s) per week  . Drug Use: No  . Sexual Activity: Not on file   Other Topics Concern   . Not on file   Social History Narrative   No regular exercise. 3- 4 caffeine drinks per day. 3 grandkids.     Family History  Problem Relation Age of Onset  . Asthma Mother   . Lung cancer Father     smoker  . Lupus Father   . Thyroid disease Mother     Outpatient Encounter Prescriptions as of 03/31/2014  Medication Sig  . albuterol (PROVENTIL HFA;VENTOLIN HFA) 108 (90 BASE) MCG/ACT inhaler Inhale 2 puffs into the lungs every 6 (six) hours as needed for wheezing.  . bimatoprost (LUMIGAN) 0.03 % ophthalmic solution 1 drop at bedtime.    . cetirizine (ZYRTEC) 5 MG tablet Take 5 mg by mouth daily.    . fluticasone (FLONASE) 50 MCG/ACT nasal spray Two sprays each nostril daily.  . mupirocin ointment (BACTROBAN) 2 % Place 1 application into the nose 2 (two) times daily.  . [DISCONTINUED] predniSONE (DELTASONE) 50 MG tablet Take 1 tablet (50 mg total) by mouth daily.          Objective:   Physical Exam  Constitutional: She is oriented to person, place, and time. She appears well-developed and well-nourished.  HENT:  Head: Normocephalic  and atraumatic.  Cardiovascular: Normal rate, regular rhythm and normal heart sounds.   Pulmonary/Chest: Effort normal and breath sounds normal.  Neurological: She is alert and oriented to person, place, and time.  Skin: Skin is warm and dry.  Psychiatric: She has a normal mood and affect. Her behavior is normal.          Assessment & Plan:  Acute bronchitis-patient has improved significantly. In fact she feels she's about 80% improved. Pneumonia vaccine given today. Will perform spirometry today for baseline. I reviewed with her her chest X ray. results which show that she does have emphysema. It's important to figure out if her degree of impairment is mild, moderate or severe. That helps is better but her out a regimen that will work well for her.  Tobacco abuse-encourage cessation. We discussed Wellbutrin, Chantix, a nicotine replacement  as options. She would like to think about it and let me know. Her sister might be interested in quitting as well. She says she's been around cigarettes her entire life. Her brother smoked while she was pregnant and then around secondhand smoke as a child and has smoked for her adult life.  COPD - spirometry today shows severe obstruction. Reviewed results.  Recommend that we start a prescription medication his results today. We'll start Anoro.  I would like to see her back in 2 months to make sure that she's tolerating it well without any side effects and they consider repeating spirometry at that time. She is getting her for acute bronchitis he certainly could have affected her tests to some degree but at this point she does only have severe to moderately severe COPD.  Pneumonia vaccine given today.

## 2014-03-31 NOTE — Addendum Note (Signed)
Addended by: Deno EtienneBARKLEY, Jace Fermin L on: 03/31/2014 03:02 PM   Modules accepted: Orders

## 2014-06-02 ENCOUNTER — Ambulatory Visit: Payer: PRIVATE HEALTH INSURANCE | Admitting: Family Medicine

## 2014-06-22 ENCOUNTER — Ambulatory Visit (INDEPENDENT_AMBULATORY_CARE_PROVIDER_SITE_OTHER): Payer: PRIVATE HEALTH INSURANCE | Admitting: Family Medicine

## 2014-06-22 ENCOUNTER — Encounter: Payer: Self-pay | Admitting: Family Medicine

## 2014-06-22 ENCOUNTER — Telehealth: Payer: Self-pay | Admitting: *Deleted

## 2014-06-22 VITALS — BP 133/68 | HR 92 | Temp 98.4°F | Wt 158.0 lb

## 2014-06-22 DIAGNOSIS — R300 Dysuria: Secondary | ICD-10-CM

## 2014-06-22 DIAGNOSIS — J029 Acute pharyngitis, unspecified: Secondary | ICD-10-CM

## 2014-06-22 DIAGNOSIS — J0100 Acute maxillary sinusitis, unspecified: Secondary | ICD-10-CM

## 2014-06-22 LAB — POCT URINALYSIS DIPSTICK
Bilirubin, UA: NEGATIVE
Blood, UA: NEGATIVE
Glucose, UA: NEGATIVE
Ketones, UA: NEGATIVE
Nitrite, UA: NEGATIVE
Protein, UA: NEGATIVE
Spec Grav, UA: 1.015
Urobilinogen, UA: 0.2
pH, UA: 6

## 2014-06-22 LAB — POCT RAPID STREP A (OFFICE): Rapid Strep A Screen: NEGATIVE

## 2014-06-22 MED ORDER — SULFAMETHOXAZOLE-TMP DS 800-160 MG PO TABS: 1.0000 | ORAL_TABLET | Freq: Two times a day (BID) | ORAL | Status: DC

## 2014-06-22 NOTE — Telephone Encounter (Signed)
Pt called and stated that she feels bad and would like for another ABX to be sent to her pharmacy. She said that she p/u her med and took it and laid down and when she got up her legs felt weak and she has a fever. She reports that her mother and sister are allergic to sulfa drugs. I told her that just because they are allergic to the med doesn't mean that she is and this allergy is NOT on her list. Therefore she should continue to take the med unless she begins to break out in hives and reminded her that Dr. Linford ArnoldMetheney told her that it would take up to 3 days for this medication to begin to work. Pt voiced understanding.Loralee PacasBarkley, Yehya Brendle Leisure [place]

## 2014-06-22 NOTE — Progress Notes (Signed)
Subjective:    Patient ID: Diana Solis, female    DOB: 01-Mar-1956, 58 y.o.   MRN: [id number]  HPI Started having fever last night to 100.6.  Had some dyruria and pelvic discomfort. Took some IBU this AM.  Some ST, mild cough, and pressure in the left side of face and in the left ear for a couple of days.  Was out of town for a funeral.  Left side of face is throbbing.  Very congested.  No Abd pain or GI upset. No worsening or alleviating factors.   Review of Systems  BP 133/68  Pulse 92  Temp(Src) 98.4 F (36.9 C)  Wt 158 lb (71.668 kg)    No Known Allergies  Past Medical History  Diagnosis Date  . Urinary incontinence 10/19/2011  . DIVERTICULOSIS, COLON 12/10/2010    Qualifier: Diagnosis of  By: Orson AloeHenderson MD, Tinnie GensJeffrey    . GLAUCOMA 09/29/2009    Qualifier: Diagnosis of  By: Linford ArnoldMetheney MD, Santina Evansatherine      Past Surgical History  Procedure Laterality Date  . Tonsillectomy    . Vaginal hysterectomy    . Incontinence surgery    . Breast cyst excision      History   Social History  . Marital Status: Married    Spouse Name: Darrel.     Number of Children: 1  . Years of Education: N/A   Occupational History  . Not on file.   Social History Main Topics  . Smoking status: Current Every Day Smoker -- 1.00 packs/day for 40 years    Types: Cigarettes  . Smokeless tobacco: Not on file  . Alcohol Use: 3.0 oz/week    6 drink(s) per week  . Drug Use: No  . Sexual Activity: Not on file   Other Topics Concern  . Not on file   Social History Narrative   No regular exercise. 3- 4 caffeine drinks per day. 3 grandkids.     Family History  Problem Relation Age of Onset  . Asthma Mother   . Lung cancer Father     smoker  . Lupus Father   . Thyroid disease Mother     Outpatient Encounter Prescriptions as of 06/22/2014  Medication Sig  . albuterol (PROVENTIL HFA;VENTOLIN HFA) 108 (90 BASE) MCG/ACT inhaler Inhale 2 puffs into the lungs every 6 (six) hours as needed for  wheezing.  . bimatoprost (LUMIGAN) 0.03 % ophthalmic solution 1 drop at bedtime.    . cetirizine (ZYRTEC) 5 MG tablet Take 5 mg by mouth daily.    . fluticasone (FLONASE) 50 MCG/ACT nasal spray Two sprays each nostril daily.  . mupirocin ointment (BACTROBAN) 2 % Place 1 application into the nose 2 (two) times daily.  Marland Kitchen. Umeclidinium-Vilanterol (ANORO ELLIPTA) 62.5-25 MCG/INH AEPB Inhale 1 puff into the lungs daily.  Marland Kitchen. sulfamethoxazole-trimethoprim (BACTRIM DS) 800-160 MG per tablet Take 1 tablet by mouth 2 (two) times daily.  . [DISCONTINUED] sulfamethoxazole-trimethoprim (BACTRIM DS) 800-160 MG per tablet Take 1 tablet by mouth 2 (two) times daily.          Objective:   Physical Exam  Constitutional: She is oriented to person, place, and time. She appears well-developed and well-nourished.  HENT:  Head: Normocephalic and atraumatic.  Right Ear: External ear normal.  Left Ear: External ear normal.  Nose: Nose normal.  Mouth/Throat: Oropharynx is clear and moist.  TMs and canals are clear.   Eyes: Conjunctivae and EOM are normal. Pupils are equal, round, and reactive to light.  Neck: Neck supple. No thyromegaly present.  Cardiovascular: Normal rate, regular rhythm and normal heart sounds.   Pulmonary/Chest: Effort normal and breath sounds normal. She has no wheezes.  Lymphadenopathy:    She has no cervical adenopathy.  Neurological: She is alert and oriented to person, place, and time.  Skin: Skin is warm and dry.  Psychiatric: She has a normal mood and affect.          Assessment & Plan:  Acute left maxillary sinusitis-will treat with Bactrim. Call if not improving. Okay to continue ibuprofen etc. some symptomatic relief. Strep was negative.  Pelvic pain/pressure-urinalysis is borderline was only positive leukocytes. We'll send for culture for confirmation. I will use Bactrim to cover for sinusitis and for UTI.

## 2014-06-24 ENCOUNTER — Ambulatory Visit: Payer: PRIVATE HEALTH INSURANCE | Admitting: Family Medicine

## 2014-06-24 LAB — URINE CULTURE
Colony Count: NO GROWTH
Organism ID, Bacteria: NO GROWTH

## 2014-07-11 ENCOUNTER — Emergency Department (INDEPENDENT_AMBULATORY_CARE_PROVIDER_SITE_OTHER)
Admission: EM | Admit: 2014-07-11 | Discharge: 2014-07-11 | Disposition: A | Discharge status: Home / Self Care | Payer: PRIVATE HEALTH INSURANCE | Source: Home / Self Care | Attending: Family Medicine | Admitting: Family Medicine

## 2014-07-11 ENCOUNTER — Encounter: Payer: Self-pay | Admitting: Emergency Medicine

## 2014-07-11 DIAGNOSIS — J441 Chronic obstructive pulmonary disease with (acute) exacerbation: Secondary | ICD-10-CM

## 2014-07-11 DIAGNOSIS — J0111 Acute recurrent frontal sinusitis: Secondary | ICD-10-CM

## 2014-07-11 DIAGNOSIS — J0110 Acute frontal sinusitis, unspecified: Secondary | ICD-10-CM

## 2014-07-11 MED ORDER — PREDNISONE 5 MG PO KIT: PACK | ORAL | Status: DC

## 2014-07-11 MED ORDER — IPRATROPIUM-ALBUTEROL 0.5-2.5 (3) MG/3ML IN SOLN
3.0000 mL | Freq: Once | RESPIRATORY_TRACT | Status: AC
  Administered 2014-07-11: 3 mL via RESPIRATORY_TRACT

## 2014-07-11 MED ORDER — LEVOFLOXACIN 500 MG PO TABS: 500.0000 mg | ORAL_TABLET | Freq: Every day | ORAL | Status: DC

## 2014-07-11 NOTE — ED Notes (Signed)
Pt was treated with Bactrim and stopped 10 days ago.  After treatment symptoms resumed with headache, sinus pain and pressure, ear pressure, 6/10 pain, chills, cough, and shortness of breath.

## 2014-07-11 NOTE — Discharge Instructions (Signed)
Thank you for coming in today. Take Levaquin daily for 10 days. Take prednisone as directed for 12 day Use albuterol as needed Call or go to the emergency room if you get worse, have trouble breathing, have chest pains, or palpitations.  Please quit smoking.   Sinusitis Sinusitis is redness, soreness, and inflammation of the paranasal sinuses. Paranasal sinuses are air pockets within the bones of your face (beneath the eyes, the middle of the forehead, or above the eyes). In healthy paranasal sinuses, mucus is able to drain out, and air is able to circulate through them by way of your nose. However, when your paranasal sinuses are inflamed, mucus and air can become trapped. This can allow bacteria and other germs to grow and cause infection. Sinusitis can develop quickly and last only a short time (acute) or continue over a long period (chronic). Sinusitis that lasts for more than 12 weeks is considered chronic.  CAUSES  Causes of sinusitis include:  Allergies.  Structural abnormalities, such as displacement of the cartilage that separates your nostrils (deviated septum), which can decrease the air flow through your nose and sinuses and affect sinus drainage.  Functional abnormalities, such as when the small hairs (cilia) that line your sinuses and help remove mucus do not work properly or are not present. SIGNS AND SYMPTOMS  Symptoms of acute and chronic sinusitis are the same. The primary symptoms are pain and pressure around the affected sinuses. Other symptoms include:  Upper toothache.  Earache.  Headache.  Bad breath.  Decreased sense of smell and taste.  A cough, which worsens when you are lying flat.  Fatigue.  Fever.  Thick drainage from your nose, which often is green and may contain pus (purulent).  Swelling and warmth over the affected sinuses. DIAGNOSIS  Your health care provider will perform a physical exam. During the exam, your health care provider  may:  Look in your nose for signs of abnormal growths in your nostrils (nasal polyps).  Tap over the affected sinus to check for signs of infection.  View the inside of your sinuses (endoscopy) using an imaging device that has a light attached (endoscope). If your health care provider suspects that you have chronic sinusitis, one or more of the following tests may be recommended:  Allergy tests.  Nasal culture. A sample of mucus is taken from your nose, sent to a lab, and screened for bacteria.  Nasal cytology. A sample of mucus is taken from your nose and examined by your health care provider to determine if your sinusitis is related to an allergy. TREATMENT  Most cases of acute sinusitis are related to a viral infection and will resolve on their own within 10 days. Sometimes medicines are prescribed to help relieve symptoms (pain medicine, decongestants, nasal steroid sprays, or saline sprays).  However, for sinusitis related to a bacterial infection, your health care provider will prescribe antibiotic medicines. These are medicines that will help kill the bacteria causing the infection.  Rarely, sinusitis is caused by a fungal infection. In theses cases, your health care provider will prescribe antifungal medicine. For some cases of chronic sinusitis, surgery is needed. Generally, these are cases in which sinusitis recurs more than 3 times per year, despite other treatments. HOME CARE INSTRUCTIONS   Drink plenty of water. Water helps thin the mucus so your sinuses can drain more easily.  Use a humidifier.  Inhale steam 3 to 4 times a day (for example, sit in the bathroom with the shower  running).  Apply a warm, moist washcloth to your face 3 to 4 times a day, or as directed by your health care provider.  Use saline nasal sprays to help moisten and clean your sinuses.  Take medicines only as directed by your health care provider.  If you were prescribed either an antibiotic or  antifungal medicine, finish it all even if you start to feel better. SEEK IMMEDIATE MEDICAL CARE IF:  You have increasing pain or severe headaches.  You have nausea, vomiting, or drowsiness.  You have swelling around your face.  You have vision problems.  You have a stiff neck.  You have difficulty breathing. MAKE SURE YOU:   Understand these instructions.  Will watch your condition.  Will get help right away if you are not doing well or get worse. Document Released: 10/30/2005 Document Revised: 03/16/2014 Document Reviewed: 11/14/2011 [facility] Patient Information 2015 [place], [place]. This information is not intended to replace advice given to you by your health care provider. Make sure you discuss any questions you have with your health care provider.   Chronic Obstructive Pulmonary Disease Exacerbation Chronic obstructive pulmonary disease (COPD) is a common lung condition in which airflow from the lungs is limited. COPD is a general term that can be used to describe many different lung problems that limit airflow, including chronic bronchitis and emphysema. COPD exacerbations are episodes when breathing symptoms become much worse and require extra treatment. Without treatment, COPD exacerbations can be life threatening, and frequent COPD exacerbations can cause further damage to your lungs. CAUSES   Respiratory infections.   Exposure to smoke.   Exposure to air pollution, chemical fumes, or dust. Sometimes there is no apparent cause or trigger. RISK FACTORS  Smoking cigarettes.  Older age.  Frequent prior COPD exacerbations. SIGNS AND SYMPTOMS   Increased coughing.   Increased thick spit (sputum) production.   Increased wheezing.   Increased shortness of breath.   Rapid breathing.   Chest tightness. DIAGNOSIS  Your medical history, a physical exam, and tests will help your health care provider make a diagnosis. Tests may include:  A chest  X-ray.  Basic lab tests.  Sputum testing.  An arterial blood gas test. TREATMENT  Depending on the severity of your COPD exacerbation, you may need to be admitted to a hospital for treatment. Some of the treatments commonly used to treat COPD exacerbations are:   Antibiotic medicines.   Bronchodilators. These are drugs that expand the air passages. They may be given with an inhaler or nebulizer. Spacer devices may be needed to help improve drug delivery.  Corticosteroid medicines.  Supplemental oxygen therapy.  HOME CARE INSTRUCTIONS   Do not smoke. Quitting smoking is very important to prevent COPD from getting worse and exacerbations from happening as often.  Avoid exposure to all substances that irritate the airway, especially to tobacco smoke.   If you were prescribed an antibiotic medicine, finish it all even if you start to feel better.  Take all medicines as directed by your health care provider.It is important to use correct technique with inhaled medicines.  Drink enough fluids to keep your urine clear or pale yellow (unless you have a medical condition that requires fluid restriction).  Use a cool mist vaporizer. This makes it easier to clear your chest when you cough.   If you have a home nebulizer and oxygen, continue to use them as directed.   Maintain all necessary vaccinations to prevent infections.   Exercise regularly.  Eat a healthy diet.   Keep all follow-up appointments as directed by your health care provider. SEEK IMMEDIATE MEDICAL CARE IF:  You have worsening shortness of breath.   You have trouble talking.   You have severe chest pain.  You have blood in your sputum.  You have a fever.  You have weakness, vomit repeatedly, or faint.   You feel confused.   You continue to get worse. MAKE SURE YOU:   Understand these instructions.  Will watch your condition.  Will get help right away if you are not doing well or get  worse. Document Released: 08/27/2007 Document Revised: 03/16/2014 Document Reviewed: 07/04/2013 [facility] Patient Information 2015 [place], [place]. This information is not intended to replace advice given to you by your health care provider. Make sure you discuss any questions you have with your health care provider.

## 2014-07-11 NOTE — ED Provider Notes (Signed)
Diana Solis is a 58 y.o. female who presents to Urgent Care today for sinus pain and pressure associated with cough and shortness of breath. About 3 weeks ago patient was treated for sinusitis with Bactrim. The antibiotics ran out about 10 days ago. Her symptoms returned recently. She notes her facial pain or pressure is moderate and she has significant nasal congestion as well as shortness of breath. No nausea vomiting or diarrhea. She's tried over-the-counter medications which have not been very effective.   Past Medical History  Diagnosis Date  . Urinary incontinence 10/19/2011  . DIVERTICULOSIS, COLON 12/10/2010    Qualifier: Diagnosis of  By: Koleen Nimrod MD, Dellis Filbert    . GLAUCOMA 09/29/2009    Qualifier: Diagnosis of  By: Madilyn Fireman MD, Barnetta Chapel     History  Substance Use Topics  . Smoking status: Current Every Day Smoker -- 1.00 packs/day for 40 years    Types: Cigarettes  . Smokeless tobacco: Not on file  . Alcohol Use: 3.0 oz/week    6 drink(s) per week   ROS as above Medications: No current facility-administered medications for this encounter.   Current Outpatient Prescriptions  Medication Sig Dispense Refill  . ranitidine (ZANTAC) 75 MG tablet Take 75 mg by mouth 2 (two) times daily.      Marland Kitchen albuterol (PROVENTIL HFA;VENTOLIN HFA) 108 (90 BASE) MCG/ACT inhaler Inhale 2 puffs into the lungs every 6 (six) hours as needed for wheezing.  2 Inhaler  11  . bimatoprost (LUMIGAN) 0.03 % ophthalmic solution 1 drop at bedtime.        . cetirizine (ZYRTEC) 5 MG tablet Take 5 mg by mouth daily.        . fluticasone (FLONASE) 50 MCG/ACT nasal spray Two sprays each nostril daily.  16 g  3  . levofloxacin (LEVAQUIN) 500 MG tablet Take 1 tablet (500 mg total) by mouth daily.  10 tablet  0  . mupirocin ointment (BACTROBAN) 2 % Place 1 application into the nose 2 (two) times daily.      . PredniSONE 5 MG KIT 12 day dosepack po  1 kit  0  . Umeclidinium-Vilanterol (ANORO ELLIPTA) 62.5-25 MCG/INH  AEPB Inhale 1 puff into the lungs daily.  60 each  3    Exam:  BP 119/74  Pulse 95  Temp(Src) 98 F (36.7 C) (Oral)  Ht 5' 4"  (1.626 m)  Wt 155 lb 8 oz (70.534 kg)  BMI 26.68 kg/m2  SpO2 94% Gen: Well NAD HEENT: EOMI,  MMM, normal posterior pharynx and tympanic membranes. Nasal turbinates are inflamed bilaterally. Tender palpation frontal sinuses bilaterally. Lungs: Increased work of breathing with poor air movement and prolonged expiratory phase bilaterally. Heart: RRR no MRG Abd: NABS, Soft. Nondistended, Nontender Exts: Brisk capillary refill, warm and well perfused.   Patient was given a DuoNeb nebulizer treatment and felt a little better. Her oxygen saturation increased from 92% to 94% on room air  No results found for this or any previous visit (from the past 24 hour(s)). No results found.  Assessment and Plan: 58 y.o. female with acute frontal sinusitis as well as COPD exacerbation. Plan to treat with prednisone and Levaquin. Continue albuterol. Followup with primary care provider.  Discussed warning signs or symptoms. Please see discharge instructions. Patient expresses understanding.   This note was created using Systems analyst. Any transcription errors are unintended.    Gregor Hams, MD 07/11/14 [phone]

## 2014-08-03 ENCOUNTER — Other Ambulatory Visit: Payer: Self-pay | Admitting: Family Medicine

## 2014-08-27 ENCOUNTER — Ambulatory Visit (INDEPENDENT_AMBULATORY_CARE_PROVIDER_SITE_OTHER): Payer: PRIVATE HEALTH INSURANCE | Admitting: Family Medicine

## 2014-08-27 VITALS — Temp 98.2°F

## 2014-08-27 DIAGNOSIS — Z23 Encounter for immunization: Secondary | ICD-10-CM

## 2014-08-27 NOTE — Progress Notes (Signed)
   Subjective:    Patient ID: Diana BickerLesa Dirusso, female    DOB: 05/20/1956, 58 y.o.   MRN: [id number]  HPI Diana Solis presented to clinic for flu immunization. She was counseled on possible side effects and denies being sick with fever or cold. Corliss SkainsJamie Starlett Pehrson, CMA    Review of Systems     Objective:   Physical Exam        Assessment & Plan:

## 2014-10-28 ENCOUNTER — Telehealth: Payer: Self-pay | Admitting: *Deleted

## 2014-10-28 MED ORDER — TIOTROPIUM BROMIDE MONOHYDRATE 1.25 MCG/ACT IN AERS: 2.0000 | INHALATION_SPRAY | RESPIRATORY_TRACT | Status: DC

## 2014-10-28 MED ORDER — IPRATROPIUM BROMIDE HFA 17 MCG/ACT IN AERS: 2.0000 | INHALATION_SPRAY | Freq: Four times a day (QID) | RESPIRATORY_TRACT | Status: DC | PRN

## 2014-10-28 MED ORDER — ALBUTEROL SULFATE HFA 108 (90 BASE) MCG/ACT IN AERS: 2.0000 | INHALATION_SPRAY | Freq: Four times a day (QID) | RESPIRATORY_TRACT | Status: DC | PRN

## 2014-10-28 NOTE — Telephone Encounter (Signed)
Spoke w/haley  Asked that spiriva be cancelled.Loralee PacasBarkley, Telford Archambeau BelgiumLynetta

## 2014-10-28 NOTE — Telephone Encounter (Signed)
Pt informed to cont on albuterol rescue inhaler.Diana PacasBarkley, Diana Apperson GlencoeLynetta

## 2014-10-28 NOTE — Telephone Encounter (Signed)
OK.  Will send in Rx for the ipratropium.  Please cancerl spiriva rx.  She has to get the albuterol. That is her rescue medicine when she feels symptomatic.

## 2014-10-28 NOTE — Telephone Encounter (Signed)
Pt called and stated that her insurance co will not pay for the Anoro. She said that they will pay for ipratropium bormide she also stated that the albuterol inhaler is not covered either.Diana Solis.Kierria Feigenbaum, Viann Shoveonya Lynetta

## 2015-01-04 ENCOUNTER — Other Ambulatory Visit: Payer: Self-pay | Admitting: Family Medicine

## 2015-01-08 ENCOUNTER — Encounter: Payer: Self-pay | Admitting: Family Medicine

## 2015-01-08 ENCOUNTER — Ambulatory Visit (INDEPENDENT_AMBULATORY_CARE_PROVIDER_SITE_OTHER): Payer: PRIVATE HEALTH INSURANCE | Admitting: Family Medicine

## 2015-01-08 VITALS — BP 139/78 | HR 84 | Temp 97.8°F | Wt 162.0 lb

## 2015-01-08 DIAGNOSIS — J329 Chronic sinusitis, unspecified: Secondary | ICD-10-CM

## 2015-01-08 DIAGNOSIS — B9689 Other specified bacterial agents as the cause of diseases classified elsewhere: Secondary | ICD-10-CM

## 2015-01-08 DIAGNOSIS — J439 Emphysema, unspecified: Secondary | ICD-10-CM

## 2015-01-08 DIAGNOSIS — A499 Bacterial infection, unspecified: Secondary | ICD-10-CM

## 2015-01-08 MED ORDER — CEFDINIR 300 MG PO CAPS: 300.0000 mg | ORAL_CAPSULE | Freq: Two times a day (BID) | ORAL | Status: AC

## 2015-01-08 MED ORDER — ALBUTEROL SULFATE HFA 108 (90 BASE) MCG/ACT IN AERS: 2.0000 | INHALATION_SPRAY | Freq: Four times a day (QID) | RESPIRATORY_TRACT | Status: DC | PRN

## 2015-01-08 MED ORDER — UMECLIDINIUM-VILANTEROL 62.5-25 MCG/INH IN AEPB: INHALATION_SPRAY | RESPIRATORY_TRACT | Status: DC

## 2015-01-08 NOTE — Progress Notes (Signed)
CC: Diana Solis is a 59 y.o. female is here for URI   Subjective: HPI:   facial pressure localized beneath the eyes and in the cheeks that has been present for about a week now. Took a drastic decline yesterday evening where symptoms are now moderate to severe in severity with postnasal drip and fevers or chills. No objective  Temperature for her fever.  No benefit from over-the-counter cough and cold medication.  Mild sore throat. Symptoms are worse in the evening. No motor or sensory disturbances or photophobia.   She is requesting refills on her inhalers for emphysema. She tells me that albuterol provides her with temporary cough suppression however ipratropium has not provided her any benefit. She's been using this 4 times a day for at least a few weeks now and it doesn't seem that effective. She tells me she has daily coughing and the past month she's had increased sputum production. No blood in sputum. She reports shortness of breath with exertion but she believes it is at her baseline. She denies chest pain orthopnea or peripheral edema   Review Of Systems Outlined In HPI  Past Medical History  Diagnosis Date  . Urinary incontinence 10/19/2011  . DIVERTICULOSIS, COLON 12/10/2010    Qualifier: Diagnosis of  By: Orson Aloe MD, Tinnie Gens    . GLAUCOMA 09/29/2009    Qualifier: Diagnosis of  By: Linford Arnold MD, Santina Evans      Past Surgical History  Procedure Laterality Date  . Tonsillectomy    . Vaginal hysterectomy    . Incontinence surgery    . Breast cyst excision     Family History  Problem Relation Age of Onset  . Asthma Mother   . Lung cancer Father     smoker  . Lupus Father   . Thyroid disease Mother     History   Social History  . Marital Status: Married    Spouse Name: Diana Solis.   . Number of Children: 1  . Years of Education: N/A   Occupational History  . Not on file.   Social History Main Topics  . Smoking status: Current Every Day Smoker -- 1.00 packs/day for 40  years    Types: Cigarettes  . Smokeless tobacco: Not on file  . Alcohol Use: 3.0 oz/week    6 drink(s) per week  . Drug Use: No  . Sexual Activity: Not on file   Other Topics Concern  . Not on file   Social History Narrative   No regular exercise. 3- 4 caffeine drinks per day. 3 grandkids.      Objective: BP 139/78 mmHg  Pulse 84  Temp(Src) 97.8 F (36.6 C) (Oral)  Wt 162 lb (73.483 kg)  General: Alert and Oriented, No Acute Distress HEENT: Pupils equal, round, reactive to light. Conjunctivae clear.  External ears unremarkable, canals clear with intact TMs with appropriate landmarks.  Middle ear appears open without effusion. Pink inferior turbinates.  Moist mucous membranes, pharynx  With moderate cobblestoning and postnasal drip.  Neck supple without palpable lymphadenopathy nor abnormal masses. Lungs:  Distant breath sounds in all lung fields with no wheezing rhonchi or rales. Frequent coughing during our encounter Extremities: No peripheral edema.  Strong peripheral pulses.  Mental Status: No depression, anxiety, nor agitation. Skin: Warm and dry.  Assessment & Plan: [name] was seen today for uri.  Diagnoses and all orders for this visit:  Pulmonary emphysema, unspecified emphysema type Orders: -     Umeclidinium-Vilanterol (ANORO ELLIPTA) 62.5-25 MCG/INH AEPB; One  puff inhaled daily. -     albuterol (PROAIR HFA) 108 (90 BASE) MCG/ACT inhaler; Inhale 2 puffs into the lungs every 6 (six) hours as needed for wheezing or shortness of breath.  Bacterial sinusitis Orders: -     cefdinir (OMNICEF) 300 MG capsule; Take 1 capsule (300 mg total) by mouth 2 (two) times daily.    bacterial sinusitis: Start Omnicef  Pulmonary emphysema: Uncontrolled chronic condition continue albuterol,  She had better anticholinergic  Coverage with Anoro,  A new refill for this was provided to substitute for ipratropium. She was given a savings card to cut down on the price of this medication. If  no better by next week follow-up with PCP  Return if symptoms worsen or fail to improve.

## 2015-02-02 ENCOUNTER — Emergency Department (INDEPENDENT_AMBULATORY_CARE_PROVIDER_SITE_OTHER)
Admission: EM | Admit: 2015-02-02 | Discharge: 2015-02-02 | Disposition: A | Discharge status: Other Acute Inpatient Hospital | Payer: PRIVATE HEALTH INSURANCE | Source: Home / Self Care | Attending: Emergency Medicine | Admitting: Emergency Medicine

## 2015-02-02 ENCOUNTER — Encounter: Payer: Self-pay | Admitting: *Deleted

## 2015-02-02 DIAGNOSIS — J441 Chronic obstructive pulmonary disease with (acute) exacerbation: Secondary | ICD-10-CM

## 2015-02-02 DIAGNOSIS — J9601 Acute respiratory failure with hypoxia: Secondary | ICD-10-CM

## 2015-02-02 MED ORDER — IPRATROPIUM-ALBUTEROL 0.5-2.5 (3) MG/3ML IN SOLN
3.0000 mL | Freq: Once | RESPIRATORY_TRACT | Status: AC
  Administered 2015-02-02: 3 mL via RESPIRATORY_TRACT

## 2015-02-02 NOTE — ED Provider Notes (Signed)
CSN: [id number]     Arrival date & time 02/02/15  7829 History   First MD Initiated Contact with Patient 02/02/15 [phone]     Chief Complaint  Patient presents with  . Shortness of Breath   (Consider location/radiation/quality/duration/timing/severity/associated sxs/prior Treatment) HPI Was seen 2/26 by Dr. Ivan Anchors for sinusitis and exacerbation of COPD and treated with Omnicef and albuterol HFA and symptoms had mostly improved after 10 days.--She finished the Omnicef about 10 days ago. She was doing well until last night. Then, last night after midnight developed increasing chest congestion and shortness of breath. Cough productive of yellow sputum. Possible fever, no documented fever. Also some sinus congestion. Has been using albuterol HFA without improvement. Husband brings her here to [facility] urgent care. Once patient arrived, she was immediately taken to exam room, evaluated and treated by nurse and physician. Displaying tachypnea, accessory muscle use and shortness of breath. 02 sat 80% on RA. Applied 1.5 L of 02 via nasal cannula.  02 Sat rose to 92%.  Note that her baseline oxygen saturation on room air in the past has been 93% range.  No nausea or vomiting or fever or chills. No chest pain.  She smokes a pack a day. Last cigarette was around midnight.  Past Medical History  Diagnosis Date  . Urinary incontinence 10/19/2011  . DIVERTICULOSIS, COLON 12/10/2010    Qualifier: Diagnosis of  By: Orson Aloe MD, Tinnie Gens    . GLAUCOMA 09/29/2009    Qualifier: Diagnosis of  By: Linford Arnold MD, Santina Evans     Past Surgical History  Procedure Laterality Date  . Tonsillectomy    . Vaginal hysterectomy    . Incontinence surgery    . Breast cyst excision     Family History  Problem Relation Age of Onset  . Asthma Mother   . Lung cancer Father     smoker  . Lupus Father   . Thyroid disease Mother    History  Substance Use Topics  . Smoking status: Current Every Day Smoker -- 0.50  packs/day for 40 years    Types: Cigarettes  . Smokeless tobacco: Current User     Comment: e-cig  . Alcohol Use: 3.0 oz/week    6 Standard drinks or equivalent per week   OB History    No data available     Review of Systems  All other systems reviewed and are negative.   Allergies  Review of patient's allergies indicates no known allergies.  Home Medications   Prior to Admission medications   Medication Sig Start Date End Date Taking? Authorizing Provider  albuterol (PROAIR HFA) 108 (90 BASE) MCG/ACT inhaler Inhale 2 puffs into the lungs every 6 (six) hours as needed for wheezing or shortness of breath. 01/08/15   Sean Hommel, DO  bimatoprost (LUMIGAN) 0.03 % ophthalmic solution 1 drop at bedtime.      Historical Provider, MD  cetirizine (ZYRTEC) 5 MG tablet Take 5 mg by mouth daily.      Historical Provider, MD  fluticasone (FLONASE) 50 MCG/ACT nasal spray Two sprays each nostril daily. 06/27/13 06/27/14  Agapito Games, MD  ranitidine (ZANTAC) 75 MG tablet Take 75 mg by mouth 2 (two) times daily.    Historical Provider, MD  Umeclidinium-Vilanterol (ANORO ELLIPTA) 62.5-25 MCG/INH AEPB One puff inhaled daily. 01/08/15   Sean Hommel, DO   BP 162/81 mmHg  Pulse 88  Temp(Src) 97.7 F (36.5 C) (Oral)  Resp 30  SpO2 92% Physical Exam  Constitutional: She is oriented  to person, place, and time. She appears well-developed and well-nourished. She appears distressed.  HENT:  Head: Normocephalic and atraumatic.  Right Ear: Tympanic membrane normal.  Left Ear: Tympanic membrane normal.  Nose: Nose normal.  Mouth/Throat: Oropharynx is clear and moist. No oropharyngeal exudate.  Eyes: Right eye exhibits no discharge. Left eye exhibits no discharge. No scleral icterus.  Neck: Neck supple.  Cardiovascular: Normal rate, regular rhythm and normal heart sounds.   Pulmonary/Chest: Accessory muscle usage present. She is in respiratory distress. She has decreased breath sounds. She has  wheezes. She has rhonchi. She has no rales.  Initial exam: Very dyspneic, decreased breath sounds throughout. Wheezes and rhonchi throughout. Pulse ox 80% on room air.  She was immediately evaluated and started on O2 1.5 L/m, and oxygen saturation improved to 92%  Lymphadenopathy:    She has no cervical adenopathy.  Neurological: She is alert and oriented to person, place, and time.  Skin: Skin is warm and dry.  Nursing note and vitals reviewed.   ED Course  Procedures (including critical care time) 8:28 AM DuoNeb nebulizer treatment given.  Reevaluated 8:38 AM. No significant improvement, still has oxygen saturation 92% on 1.5 L/m O2. Still diffuse wheezing, respiratory rate 30, and pt feeling of shortness of breath. No chest pain.  EMS called to transfer to the hospital. Patient and husband agree.  MDM   1. Acute respiratory failure with hypoxia   2. Acute exacerbation of chronic obstructive pulmonary disease (COPD)     EM[facility] arrived 8:56 AM [facility]  Lajean Manesavid Massey, MD 02/02/15 [phone]

## 2015-02-02 NOTE — ED Notes (Signed)
Kaya c/o 1 month URI. Finshed 10 days of ABX about 10 days ago. She has h/o COPD. Became very SOB yesterday. Used [employer] Air inhaler without relief. Displaying tachypnea, accessory muscle use and SOB. 02 sat 80% on RA. Applied 1 L of 02. Normal 02 is 93%. 02 Sat rose to 92%

## 2015-02-15 ENCOUNTER — Telehealth: Payer: Self-pay | Admitting: Family Medicine

## 2015-02-15 ENCOUNTER — Encounter: Payer: Self-pay | Admitting: Family Medicine

## 2015-02-15 ENCOUNTER — Ambulatory Visit (INDEPENDENT_AMBULATORY_CARE_PROVIDER_SITE_OTHER): Payer: PRIVATE HEALTH INSURANCE | Admitting: Family Medicine

## 2015-02-15 VITALS — BP 120/67 | HR 84 | Temp 97.8°F | Wt 162.0 lb

## 2015-02-15 DIAGNOSIS — J439 Emphysema, unspecified: Secondary | ICD-10-CM | POA: Diagnosis not present

## 2015-02-15 MED ORDER — BROVANA 15 MCG/2ML IN NEBU: 15.0000 ug | INHALATION_SOLUTION | Freq: Two times a day (BID) | RESPIRATORY_TRACT | Status: DC

## 2015-02-15 MED ORDER — BUDESONIDE 0.5 MG/2ML IN SUSP: 0.5000 mg | Freq: Two times a day (BID) | RESPIRATORY_TRACT | Status: DC

## 2015-02-15 NOTE — Progress Notes (Signed)
CC: Diana Solis is a 59 y.o. female is here for hospital f/u   Subjective: HPI:  COPD exacerbation resulting in hypoxemia and shortness of breath requiring hospitalization on the 22nd until 25th of March. The day prior to admission she felt like her allergies were bad causing shortness of breath and cough. Her situation deteriorated overnight where she was having shortness of breath to a degree that prompted her to go to our urgent care center where she was then directed to go to the emergency room. Resting pulse oximetry was 87 dropping to 83% with walking. Symptoms were not controlled on oxygen via nasal cannula and she required BiPAP and admission to the intensive care unit. She was treated with IV steroids and broad-spectrum antibiotics. She had an x-ray that was concerning and possibly showed a mass however on follow-up CT scan the only abnormality was known emphysematous changes.  She was discharged on the 25th with a prednisone taper, azithromycin and Omnicef. She's finished all of these but 1 dose of prednisone. She tells me she feels great and so much better than when she was at the hospital. She admits she never used the oxygen at home but is using a nebulizer. She feels that [place] and Pulmicort work significantly better than Anoro with respect to wheezing shortness of breath and cough.  Since she returned home she denies any fevers, chills, fatigue, blood in sputum, chest pain nor shortness of breath.  Review Of Systems Outlined In HPI  Past Medical History  Diagnosis Date  . Urinary incontinence 10/19/2011  . DIVERTICULOSIS, COLON 12/10/2010    Qualifier: Diagnosis of  By: Orson AloeHenderson MD, Tinnie GensJeffrey    . GLAUCOMA 09/29/2009    Qualifier: Diagnosis of  By: Linford ArnoldMetheney MD, Santina Evansatherine      Past Surgical History  Procedure Laterality Date  . Tonsillectomy    . Vaginal hysterectomy    . Incontinence surgery    . Breast cyst excision     Family History  Problem Relation Age of Onset  .  Asthma Mother   . Lung cancer Father     smoker  . Lupus Father   . Thyroid disease Mother     History   Social History  . Marital Status: Married    Spouse Name: Darrel.   . Number of Children: 1  . Years of Education: N/A   Occupational History  . Not on file.   Social History Main Topics  . Smoking status: Current Every Day Smoker -- 0.50 packs/day for 40 years    Types: Cigarettes  . Smokeless tobacco: Current User     Comment: e-cig  . Alcohol Use: 3.0 oz/week    6 Standard drinks or equivalent per week  . Drug Use: No  . Sexual Activity: Not on file   Other Topics Concern  . Not on file   Social History Narrative   No regular exercise. 3- 4 caffeine drinks per day. 3 grandkids.      Objective: BP 120/67 mmHg  Pulse 84  Temp(Src) 97.8 F (36.6 C) (Oral)  Wt 162 lb (73.483 kg)  SpO2 94%  General: Alert and Oriented, No Acute Distress HEENT: Pupils equal, round, reactive to light. Conjunctivae clear.  External ears unremarkable, canals clear with intact TMs with appropriate landmarks.  Middle ear appears open without effusion. Pink inferior turbinates.  Moist mucous membranes, pharynx without inflammation nor lesions.  Neck supple without palpable lymphadenopathy nor abnormal masses. Lungs:  distant breath sounds Clear to auscultation bilaterally,  no wheezing/ronchi/rales.  Comfortable work of breathing. Good air movement. Cardiac: Regular rate and rhythm. Normal S1/S2.  No murmurs, rubs, nor gallops.   Extremities: No peripheral edema.  Strong peripheral pulses.  Mental Status: No depression, anxiety, nor agitation. Skin: Warm and dry.  Assessment & Plan: Cathlyn was seen today for hospital f/u.  Diagnoses and all orders for this visit:  Pulmonary emphysema, unspecified emphysema type Orders: -     BROVANA 15 MCG/2ML NEBU; Take 2 mLs (15 mcg total) by nebulization 2 (two) times daily. -     budesonide (PULMICORT) 0.5 MG/2ML nebulizer solution; Take 2 mLs  (0.5 mg total) by nebulization 2 (two) times daily.    pulmonary emphysema: Stable currently controlled, stopping anoro, given the severity of her COPD exacerbation I have encouraged her to follow through with seeing pulmonology has been about system which she has an appointment. For now continue Brovana and Pulmicort. She does not require oxygen and has requested that [facility] to remove the supplies from her home but will keep the nebulizer   Follow up three months.  25 minutes spent face-to-face during visit today of which at least 50% was counseling or coordinating care regarding: 1. Pulmonary emphysema, unspecified emphysema type      Return if symptoms worsen or fail to improve.

## 2015-02-15 NOTE — Telephone Encounter (Signed)
Pt has request to see Dr.Hommel from now on as her PCP. I did make her 3 mo F/u with Dr.Hommel in July. Please let me know that this is okay. Thanks, DIRECTVJennifer

## 2015-02-15 NOTE — Telephone Encounter (Signed)
Ok with me 

## 2015-02-15 NOTE — Telephone Encounter (Signed)
Diana Solis, Patient believes she's using [facility] for oxygen, can you please contact them and discontinue her oxygen services?

## 2015-02-15 NOTE — Telephone Encounter (Signed)
Ok with me, routing to Dr. MJudie Petit

## 2015-02-16 MED ORDER — AMBULATORY NON FORMULARY MEDICATION: Status: DC

## 2015-02-16 NOTE — Telephone Encounter (Signed)
We need send an order.order faxed to [phone]

## 2015-03-01 ENCOUNTER — Telehealth: Payer: Self-pay | Admitting: Family Medicine

## 2015-03-01 NOTE — Telephone Encounter (Signed)
PA needed for brovana

## 2015-03-02 ENCOUNTER — Telehealth: Payer: Self-pay | Admitting: Family Medicine

## 2015-03-02 NOTE — Telephone Encounter (Signed)
Received fax for Pa on brovana filled out form and placed in Dr. Genelle BalHommel's box for a signature will fax when signed and wait on auth. - CF

## 2015-03-03 NOTE — Telephone Encounter (Signed)
Received fax for prior auth on Brovana and the medication has been approved from04/19/2016 -03/01/2016. Case # [id number] - CF

## 2015-03-09 DIAGNOSIS — G4710 Hypersomnia, unspecified: Secondary | ICD-10-CM | POA: Insufficient documentation

## 2015-05-18 ENCOUNTER — Ambulatory Visit: Payer: PRIVATE HEALTH INSURANCE | Admitting: Family Medicine

## 2015-05-27 ENCOUNTER — Ambulatory Visit: Payer: PRIVATE HEALTH INSURANCE | Admitting: Family Medicine

## 2015-05-31 ENCOUNTER — Ambulatory Visit (INDEPENDENT_AMBULATORY_CARE_PROVIDER_SITE_OTHER): Payer: PRIVATE HEALTH INSURANCE | Admitting: Family Medicine

## 2015-05-31 ENCOUNTER — Encounter: Payer: Self-pay | Admitting: Family Medicine

## 2015-05-31 VITALS — BP 147/72 | HR 62 | Wt 169.0 lb

## 2015-05-31 DIAGNOSIS — R635 Abnormal weight gain: Secondary | ICD-10-CM | POA: Diagnosis not present

## 2015-05-31 DIAGNOSIS — J439 Emphysema, unspecified: Secondary | ICD-10-CM | POA: Diagnosis not present

## 2015-05-31 LAB — COMPLETE METABOLIC PANEL WITH GFR
ALT: 16 U/L (ref 0–35)
AST: 19 U/L (ref 0–37)
Albumin: 4.6 g/dL (ref 3.5–5.2)
Alkaline Phosphatase: 83 U/L (ref 39–117)
BUN: 13 mg/dL (ref 6–23)
CO2: 28 mEq/L (ref 19–32)
Calcium: 9.8 mg/dL (ref 8.4–10.5)
Chloride: 102 mEq/L (ref 96–112)
Creat: 0.73 mg/dL (ref 0.50–1.10)
GFR, Est African American: >89 mL/min
GFR, Est Non African American: >89 mL/min
Glucose, Bld: 101 mg/dL — ABNORMAL HIGH (ref 70–99)
Potassium: 4.2 mEq/L (ref 3.5–5.3)
Sodium: 139 mEq/L (ref 135–145)
Total Bilirubin: 0.4 mg/dL (ref 0.2–1.2)
Total Protein: 7.4 g/dL (ref 6.0–8.3)

## 2015-05-31 NOTE — Progress Notes (Signed)
CC: Diana MaidensLesa Willette Solis is a 59 y.o. female is here for f/u pulmonary emphysema   Subjective: HPI:  Follow-up COPD: She was started on Breo by Dr. Frann RiderJavid  And she feels like her breathing is somewhat better this summer compared to last summer. Symptoms are worse in hot and humid weather. Symptoms are described as shortness of breath and cough. Currently symptoms are not bothering her quality of life. She denies wheezing or chest discomfort   Her major complaint today is unintentional weight loss. Since his last she's gained 5 pounds despite consciously restricting herself snacking, she feels that she's been successful with cutting back on portions. Despite this she seems to gain a pound every few weeks. She is no longer fitting and her clothes. She denies any other new changes to medications other than that described above. Review systems positive for hot flashes at night.   Review Of Systems Outlined In HPI  Past Medical History  Diagnosis Date  . Urinary incontinence 10/19/2011  . DIVERTICULOSIS, COLON 12/10/2010    Qualifier: Diagnosis of  By: Orson AloeHenderson MD, Tinnie GensJeffrey    . GLAUCOMA 09/29/2009    Qualifier: Diagnosis of  By: Linford ArnoldMetheney MD, Santina Evansatherine      Past Surgical History  Procedure Laterality Date  . Tonsillectomy    . Vaginal hysterectomy    . Incontinence surgery    . Breast cyst excision     Family History  Problem Relation Age of Onset  . Asthma Mother   . Lung cancer Father     smoker  . Lupus Father   . Thyroid disease Mother     History   Social History  . Marital Status: Married    Spouse Name: Darrel.   . Number of Children: 1  . Years of Education: N/A   Occupational History  . Not on file.   Social History Main Topics  . Smoking status: Current Every Day Smoker -- 0.50 packs/day for 40 years    Types: Cigarettes  . Smokeless tobacco: Current User     Comment: e-cig  . Alcohol Use: 3.0 oz/week    6 Standard drinks or equivalent per week  . Drug Use: No  .  Sexual Activity: Not on file   Other Topics Concern  . Not on file   Social History Narrative   No regular exercise. 3- 4 caffeine drinks per day. 3 grandkids.      Objective: BP 147/72 mmHg  Pulse 62  Wt 169 lb (76.658 kg)  SpO2 96%  General: Alert and Oriented, No Acute Distress HEENT: Pupils equal, round, reactive to light. Conjunctivae clear.  mostly dismembering spares are unremarkable: Clear to auscultation bilaterally, no wheezing/ronchi/rales.  Comfortable work of breathing. Good air movement. Cardiac: Regular rate and rhythm. Normal S1/S2.  No murmurs, rubs, nor gallops.   Extremities: No peripheral edema.  Strong peripheral pulses.  Mental Status: No depression, anxiety, nor agitation. Skin: Warm and dry.  Assessment & Plan: Diana Solis was seen today for f/u pulmonary emphysema.  Diagnoses and all orders for this visit:  Pulmonary emphysema, unspecified emphysema type  Abnormal weight gain Orders: -     TSH -     T4, free -     T3, free -     COMPLETE METABOLIC PANEL WITH GFR   Emphysema: Control no changes to medications Abnormal weight gain: Rule out thyroid abnormality or metabolic abnormality. Ultimate plan will be based on the above results  No Follow-up on file.

## 2015-06-01 ENCOUNTER — Telehealth: Payer: Self-pay | Admitting: Family Medicine

## 2015-06-01 DIAGNOSIS — E039 Hypothyroidism, unspecified: Secondary | ICD-10-CM

## 2015-06-01 LAB — TSH: TSH: 10.237 u[IU]/mL — ABNORMAL HIGH (ref 0.350–4.500)

## 2015-06-01 LAB — T4, FREE: Free T4: 0.85 ng/dL (ref 0.80–1.80)

## 2015-06-01 LAB — T3, FREE: T3, Free: 2.6 pg/mL (ref 2.3–4.2)

## 2015-06-01 MED ORDER — LEVOTHYROXINE SODIUM 50 MCG PO TABS: 50.0000 ug | ORAL_TABLET | Freq: Every day | ORAL | Status: DC

## 2015-06-01 NOTE — Telephone Encounter (Signed)
Pt.notified

## 2015-06-01 NOTE — Telephone Encounter (Signed)
Diana Solis, Will you please let patient know that her thyroid function appears to be suppressed which would lead to her feeling so "off".  I've sent in a thyroid supplement to her [facility] and would recommend she return for a recheck in one month.

## 2015-06-30 ENCOUNTER — Encounter: Payer: Self-pay | Admitting: Family Medicine

## 2015-06-30 ENCOUNTER — Ambulatory Visit (INDEPENDENT_AMBULATORY_CARE_PROVIDER_SITE_OTHER): Payer: PRIVATE HEALTH INSURANCE | Admitting: Family Medicine

## 2015-06-30 VITALS — BP 122/63 | HR 65 | Wt 170.0 lb

## 2015-06-30 DIAGNOSIS — K5900 Constipation, unspecified: Secondary | ICD-10-CM | POA: Diagnosis not present

## 2015-06-30 DIAGNOSIS — E039 Hypothyroidism, unspecified: Secondary | ICD-10-CM

## 2015-06-30 DIAGNOSIS — R350 Frequency of micturition: Secondary | ICD-10-CM | POA: Diagnosis not present

## 2015-06-30 LAB — TSH: TSH: 6.651 u[IU]/mL — ABNORMAL HIGH (ref 0.350–4.500)

## 2015-06-30 NOTE — Progress Notes (Signed)
CC: Diana Solis is a 59 y.o. female is here for f/u thyroid   Subjective: HPI:  Follow-up hyperthyroidism: Since I saw her last she began taking 50 MCG of levothyroxine. She denies any improvement with her mild chronic fatigue. She's also endorsing constipation that has not been responsive to increasing fiber in diet or taking 3 stool softeners a day. She reports she will go 2-3 days without having a bowel movement and when the time comes it requires effort to evacuate her bowels. She believes that her constipation has been present for matter of months but not improving with the above interventions. She feels bloated but denies abdominal pain otherwise.  Complains of urinary frequency and small volumes when she ends up urinating. This is been going on for the last couple weeks. She denies any incontinence. She wakes 2 times a night to urinate. She denies urinary hesitancy or dysuria. No blood in urine. Denies fevers, chills or flank pain   Review Of Systems Outlined In HPI  Past Medical History  Diagnosis Date  . Urinary incontinence 10/19/2011  . DIVERTICULOSIS, COLON 12/10/2010    Qualifier: Diagnosis of  By: Orson Aloe MD, Tinnie Gens    . GLAUCOMA 09/29/2009    Qualifier: Diagnosis of  By: Linford Arnold MD, Santina Evans      Past Surgical History  Procedure Laterality Date  . Tonsillectomy    . Vaginal hysterectomy    . Incontinence surgery    . Breast cyst excision     Family History  Problem Relation Age of Onset  . Asthma Mother   . Lung cancer Father     smoker  . Lupus Father   . Thyroid disease Mother     Social History   Social History  . Marital Status: Married    Spouse Name: Darrel.   . Number of Children: 1  . Years of Education: N/A   Occupational History  . Not on file.   Social History Main Topics  . Smoking status: Current Every Day Smoker -- 0.50 packs/day for 40 years    Types: Cigarettes  . Smokeless tobacco: Current User     Comment: e-cig  . Alcohol Use:  3.0 oz/week    6 Standard drinks or equivalent per week  . Drug Use: No  . Sexual Activity: Not on file   Other Topics Concern  . Not on file   Social History Narrative   No regular exercise. 3- 4 caffeine drinks per day. 3 grandkids.      Objective: BP 122/63 mmHg  Pulse 65  Wt 170 lb (77.111 kg)  Vital signs reviewed. General: Alert and Oriented, No Acute Distress HEENT: Pupils equal, round, reactive to light. Conjunctivae clear.  External ears unremarkable.  Moist mucous membranes. Lungs: Clear and comfortable work of breathing, speaking in full sentences without accessory muscle use. Cardiac: Regular rate and rhythm.  Neuro: CN II-XII grossly intact, gait normal. Extremities: No peripheral edema.  Strong peripheral pulses.  Mental Status: No depression, anxiety, nor agitation. Logical though process. Skin: Warm and dry.  Assessment & Plan: Anslie was seen today for f/u thyroid.  Diagnoses and all orders for this visit:  Hypothyroidism, unspecified hypothyroidism type -     TSH  Urinary frequency -     Urinalysis, Routine w reflex microscopic (not at [facility]) -     Urine culture  Constipation, unspecified constipation type   Hypothyroidism: Clinically uncontrolled with constipation and fatigue, checking TSH to determine if it safe to increase levothyroxine Urinary  free C: Check a urinalysis and urine culture to rule out UTI or glucose in urine Constipation: Addressing hypothyroidism above, start MiraLAX. She thinks constipation started sometime around the time she began one of her inhalers, she's not sure that this would make sense if she is referring to Spiriva.  Return if symptoms worsen or fail to improve.  25 minutes spent face-to-face during visit today of which at least 50% was counseling or coordinating care regarding: 1. Hypothyroidism, unspecified hypothyroidism type   2. Urinary frequency   3. Constipation, unspecified constipation type

## 2015-07-01 ENCOUNTER — Telehealth: Payer: Self-pay | Admitting: Family Medicine

## 2015-07-01 LAB — URINALYSIS, ROUTINE W REFLEX MICROSCOPIC
Bilirubin Urine: NEGATIVE
Glucose, UA: NEGATIVE
Hgb urine dipstick: NEGATIVE
Ketones, ur: NEGATIVE
Leukocytes, UA: NEGATIVE
Nitrite: NEGATIVE
Protein, ur: NEGATIVE
Specific Gravity, Urine: 1.007 (ref 1.001–1.035)
pH: 5.5 (ref 5.0–8.0)

## 2015-07-01 MED ORDER — LEVOTHYROXINE SODIUM 88 MCG PO TABS: 88.0000 ug | ORAL_TABLET | Freq: Every day | ORAL | Status: DC

## 2015-07-01 NOTE — Telephone Encounter (Signed)
Diana Solis, Will you please let patient know that her thyroid function is improving but still underactive.  I'd recommend increasing her levothyroxine dose to daily.  A new Rx was [facility] her [facility].  I'd recommend f/u in 1-2 months.  The urine test was normal.

## 2015-07-01 NOTE — Telephone Encounter (Signed)
Pt.notified

## 2015-07-02 LAB — URINE CULTURE: Colony Count: 8000

## 2015-07-28 ENCOUNTER — Encounter: Payer: Self-pay | Admitting: Family Medicine

## 2015-07-28 ENCOUNTER — Ambulatory Visit (INDEPENDENT_AMBULATORY_CARE_PROVIDER_SITE_OTHER): Payer: PRIVATE HEALTH INSURANCE | Admitting: Family Medicine

## 2015-07-28 VITALS — BP 136/60 | HR 76 | Wt 173.0 lb

## 2015-07-28 DIAGNOSIS — Z23 Encounter for immunization: Secondary | ICD-10-CM

## 2015-07-28 DIAGNOSIS — E039 Hypothyroidism, unspecified: Secondary | ICD-10-CM

## 2015-07-28 LAB — TSH: TSH: 3.477 u[IU]/mL (ref 0.350–4.500)

## 2015-07-28 NOTE — Progress Notes (Signed)
CC: Diana Solis is a 59 y.o. female is here for Hypothyroidism   Subjective: HPI:  Follow-up hypothyroidism: Since starting new levothyroxine dose at 90 MCG she denies any known side effects. She continues to unintentionally gained weight, feels tired most hours of the day. Constipation has slightly improved with MiraLAX but is still requiring daily dosing. No unintentional weight loss. Denies anxiety or depression.   Review Of Systems Outlined In HPI  Past Medical History  Diagnosis Date  . Urinary incontinence 10/19/2011  . DIVERTICULOSIS, COLON 12/10/2010    Qualifier: Diagnosis of  By: Orson Aloe MD, Tinnie Gens    . GLAUCOMA 09/29/2009    Qualifier: Diagnosis of  By: Linford Arnold MD, Santina Evans      Past Surgical History  Procedure Laterality Date  . Tonsillectomy    . Vaginal hysterectomy    . Incontinence surgery    . Breast cyst excision     Family History  Problem Relation Age of Onset  . Asthma Mother   . Lung cancer Father     smoker  . Lupus Father   . Thyroid disease Mother     Social History   Social History  . Marital Status: Married    Spouse Name: Darrel.   . Number of Children: 1  . Years of Education: N/A   Occupational History  . Not on file.   Social History Main Topics  . Smoking status: Current Every Day Smoker -- 0.50 packs/day for 40 years    Types: Cigarettes  . Smokeless tobacco: Current User     Comment: e-cig  . Alcohol Use: 3.0 oz/week    6 Standard drinks or equivalent per week  . Drug Use: No  . Sexual Activity: Not on file   Other Topics Concern  . Not on file   Social History Narrative   No regular exercise. 3- 4 caffeine drinks per day. 3 grandkids.      Objective: BP 136/60 mmHg  Pulse 76  Wt 173 lb (78.472 kg)  Vital signs reviewed. General: Alert and Oriented, No Acute Distress HEENT: Pupils equal, round, reactive to light. Conjunctivae clear.  External ears unremarkable.  Moist mucous membranes. Lungs: Clear and  comfortable work of breathing, speaking in full sentences without accessory muscle use. Cardiac: Regular rate and rhythm.  Neuro: CN II-XII grossly intact, gait normal. Extremities: No peripheral edema.  Strong peripheral pulses.  Mental Status: No depression, anxiety, nor agitation. Logical though process. Skin: Warm and dry. Assessment & Plan: [name] was seen today for hypothyroidism.  Diagnoses and all orders for this visit:  Hypothyroidism, unspecified hypothyroidism type -     TSH  Other orders -     Flu Vaccine QUAD 36+ mos IM   Hypothyroidism: Clinically uncontrolled based on symptoms however like to get a TSH to confirm that symptoms are due to underactive thyroid and inadequate supplementation..   Return if symptoms worsen or fail to improve.

## 2015-08-05 ENCOUNTER — Encounter: Payer: Self-pay | Admitting: Osteopathic Medicine

## 2015-08-05 ENCOUNTER — Ambulatory Visit (INDEPENDENT_AMBULATORY_CARE_PROVIDER_SITE_OTHER): Payer: PRIVATE HEALTH INSURANCE | Admitting: Osteopathic Medicine

## 2015-08-05 VITALS — BP 117/61 | HR 82 | Temp 99.2°F | Ht 64.0 in | Wt 170.0 lb

## 2015-08-05 DIAGNOSIS — J329 Chronic sinusitis, unspecified: Secondary | ICD-10-CM | POA: Diagnosis not present

## 2015-08-05 DIAGNOSIS — J0100 Acute maxillary sinusitis, unspecified: Secondary | ICD-10-CM

## 2015-08-05 DIAGNOSIS — R0982 Postnasal drip: Secondary | ICD-10-CM | POA: Diagnosis not present

## 2015-08-05 DIAGNOSIS — B349 Viral infection, unspecified: Secondary | ICD-10-CM | POA: Diagnosis not present

## 2015-08-05 DIAGNOSIS — B9789 Other viral agents as the cause of diseases classified elsewhere: Secondary | ICD-10-CM

## 2015-08-05 MED ORDER — IPRATROPIUM BROMIDE 0.03 % NA SOLN: 2.0000 | Freq: Two times a day (BID) | NASAL | Status: DC

## 2015-08-05 MED ORDER — AMOXICILLIN-POT CLAVULANATE 875-125 MG PO TABS: 1.0000 | ORAL_TABLET | Freq: Two times a day (BID) | ORAL | Status: DC

## 2015-08-05 NOTE — Progress Notes (Signed)
HPI: Diana Solis is a 59 y.o. female who presents to [facility]  today for chief complaint of:  Chief Complaint  Patient presents with  . Fever    started yesterday    . Location: L face pain, generalized myalgia . Quality: sore/pressure . Severity: mild to moderate . Duration: 1 day fever, facial pain since dental work is about the same over the course of a week . Assoc signs/symptoms: Temp 101 at home   Past medical, social and family history reviewed: Past Medical History  Diagnosis Date  . Urinary incontinence 10/19/2011  . DIVERTICULOSIS, COLON 12/10/2010    Qualifier: Diagnosis of  By: Orson Aloe MD, Tinnie Gens    . GLAUCOMA 09/29/2009    Qualifier: Diagnosis of  By: Linford Arnold MD, Catherine     Patient Active Problem List   Diagnosis Date Noted  . Hypothyroidism 06/01/2015  . Emphysema of lung 03/23/2014  . Olecranon bursitis of left elbow 12/09/2013  . Spastic colon 10/19/2011  . Urinary incontinence 10/19/2011  . DIVERTICULOSIS, COLON 12/10/2010  . OTHER SPECIFIED DISORDERS OF THYROID 10/13/2010  . GLAUCOMA 09/29/2009  . POSTMENOPAUSAL STATUS 09/29/2009    Past Surgical History  Procedure Laterality Date  . Tonsillectomy    . Vaginal hysterectomy    . Incontinence surgery    . Breast cyst excision     Social History  Substance Use Topics  . Smoking status: Current Every Day Smoker -- 0.50 packs/day for 40 years    Types: Cigarettes  . Smokeless tobacco: Current User     Comment: e-cig  . Alcohol Use: 3.0 oz/week    6 Standard drinks or equivalent per week   Family History  Problem Relation Age of Onset  . Asthma Mother   . Lung cancer Father     smoker  . Lupus Father   . Thyroid disease Mother     Current Outpatient Prescriptions  Medication Sig Dispense Refill  . albuterol (PROAIR HFA) 108 (90 BASE) MCG/ACT inhaler Inhale 2 puffs into the lungs every 6 (six) hours as needed for wheezing or shortness of breath. 18  g 1  . AMBULATORY NON FORMULARY MEDICATION Please discontinue all oxygen services for this patient 1 each 0  . cetirizine (ZYRTEC) 5 MG tablet Take 5 mg by mouth daily.      . Fluticasone Furoate-Vilanterol (BREO ELLIPTA) 100-25 MCG/INH AEPB Inhale 1 puff into the lungs.    Marland Kitchen levothyroxine (SYNTHROID, LEVOTHROID) 88 MCG tablet Take 1 tablet (88 mcg total) by mouth daily. 30 tablet 1  . ranitidine (ZANTAC) 75 MG tablet Take 75 mg by mouth 2 (two) times daily.    Marland Kitchen SPIRIVA RESPIMAT 1.25 MCG/ACT AERS   0  . albuterol (PROVENTIL) (2.5 MG/3ML) 0.083% nebulizer solution   0  . fluticasone (FLONASE) 50 MCG/ACT nasal spray Two sprays each nostril daily. 16 g 3   No current facility-administered medications for this visit.   No Known Allergies    Review of Systems: CONSTITUTIONAL: (+) fever/chills, no unintentional weight changes HEAD/EYES/EARS/NOSE/THROAT: No headache/vision change or hearing change, (+) sore throat and sinus pressure (+0 teeth pain on L side after dental work CARDIAC: No chest pain/pressure/palpitations, RESPIRATORY: (+) drycough, no shortness of breath/wheeze GASTROINTESTINAL: No nausea/vomiting/abdominal pain/blood in stool/diarrhea/constipation MUSCULOSKELETAL: (+) myalgia/arthralgia HEM/ONC:no abnormal lymph node   Exam:  BP 117/61 mmHg  Pulse 82  Temp(Src) 99.2 F (37.3 C) (Oral)  Ht  (1.626 m)  Wt 170 lb (77.111 kg)  BMI 29.17 kg/m2  SpO2 95% Constitutional: VSS, see above. General Appearance: alert, well-developed, well-nourished, NAD Eyes: Normal lids and conjunctive, non-icteric sclera, PERRLA Ears, Nose, Mouth, Throat: Normal external inspection ears/nares/mouth/lips/gums, Normal TM bilaterally, MMM, posterior pharynx without erythema/exudate. Fair dentition, (+) caries/fillings but no infection, gums normal Neck: No masses, trachea midline. No thyroid enlargement/tenderness/mass appreciated Respiratory: Normal respiratory effort. no  wheeze/rhonchi/rales Cardiovascular: S1/S2 normal, no murmur/rub/gallop auscultated. RRR.   No results found for this or any previous visit (from the past 72 hour(s)).    ASSESSMENT/PLAN:  Viral sinusitis - Plan: ipratropium (ATROVENT) 0.03 % nasal spray  Postnasal drip - Plan: ipratropium (ATROVENT) 0.03 % nasal spray  Acute maxillary sinusitis, recurrence not specified - Plan: amoxicillin-clavulanate (AUGMENTIN) 875-125 MG per tablet   Likely viral sinusitis/cold, this was explained to patient, given Rx for abx if no improvement in 4 - 5 days to treat as sinus infection or RTC further eval if any concerns.

## 2015-08-05 NOTE — Patient Instructions (Signed)
IF YOU ARE NOT FEELING BETTER IN THE NEXT 4 - 5 DAYS, FILL THE PRESCRIPTION FOR THE ANTIBIOTICS (AMOXICILLIN-CLAVULANATE)  AND TAKE THESE FOR SINUS INFECTION. ANY CONCERNS, PLEASE COME BACK FOR FURTHER EVALUATION.     Viral Infections A virus is a type of germ. Viruses can cause:  Minor sore throats.  Aches and pains.  Headaches.  Runny nose.  Rashes.  Watery eyes.  Tiredness.  Coughs.  Loss of appetite.  Feeling sick to your stomach (nausea).  Throwing up (vomiting).  Watery poop (diarrhea). HOME CARE   Only take medicines as told by your doctor.  Drink enough water and fluids to keep your pee (urine) clear or pale yellow. Sports drinks are a good choice.  Get plenty of rest and eat healthy. Soups and broths with crackers or rice are fine. GET HELP RIGHT AWAY IF:   You have a very bad headache.  You have shortness of breath.  You have chest pain or neck pain.  You have an unusual rash.  You cannot stop throwing up.  You have watery poop that does not stop.  You cannot keep fluids down.  You or your child has a temperature by mouth above 102 F (38.9 C), not controlled by medicine.  Your baby is older than 3 months with a rectal temperature of 102 F (38.9 C) or higher.  Your baby is 64 months old or younger with a rectal temperature of 100.4 F (38 C) or higher. MAKE SURE YOU:   Understand these instructions.  Will watch this condition.  Will get help right away if you are not doing well or get worse. Document Released: 10/12/2008 Document Revised: 01/22/2012 Document Reviewed: 03/07/2011 [facility] Patient Information 2015 [place], [place]. This information is not intended to replace advice given to you by your health care provider. Make sure you discuss any questions you have with your health care provider.

## 2015-08-09 ENCOUNTER — Encounter: Payer: Self-pay | Admitting: Family Medicine

## 2015-08-09 ENCOUNTER — Ambulatory Visit (INDEPENDENT_AMBULATORY_CARE_PROVIDER_SITE_OTHER): Payer: PRIVATE HEALTH INSURANCE

## 2015-08-09 ENCOUNTER — Ambulatory Visit (INDEPENDENT_AMBULATORY_CARE_PROVIDER_SITE_OTHER): Payer: PRIVATE HEALTH INSURANCE | Admitting: Family Medicine

## 2015-08-09 VITALS — BP 127/76 | HR 82 | Temp 99.3°F | Wt 169.0 lb

## 2015-08-09 DIAGNOSIS — R509 Fever, unspecified: Secondary | ICD-10-CM | POA: Diagnosis not present

## 2015-08-09 DIAGNOSIS — J439 Emphysema, unspecified: Secondary | ICD-10-CM | POA: Diagnosis not present

## 2015-08-09 LAB — POCT URINALYSIS DIPSTICK
Bilirubin, UA: NEGATIVE
Blood, UA: NEGATIVE
Glucose, UA: NEGATIVE
Ketones, UA: NEGATIVE
Leukocytes, UA: NEGATIVE
Nitrite, UA: NEGATIVE
Protein, UA: NEGATIVE
Spec Grav, UA: <=1.005
Urobilinogen, UA: 0.2
pH, UA: 5.5

## 2015-08-09 IMAGING — CR DG CHEST 2V
2 series · 2 of 2 positions shown · non-contrast
Comparison: 03/23/2014

CLINICAL DATA: Fever for 2 days.  Chills.  COPD.

EXAM:
CHEST  2 VIEW

[chest pa]
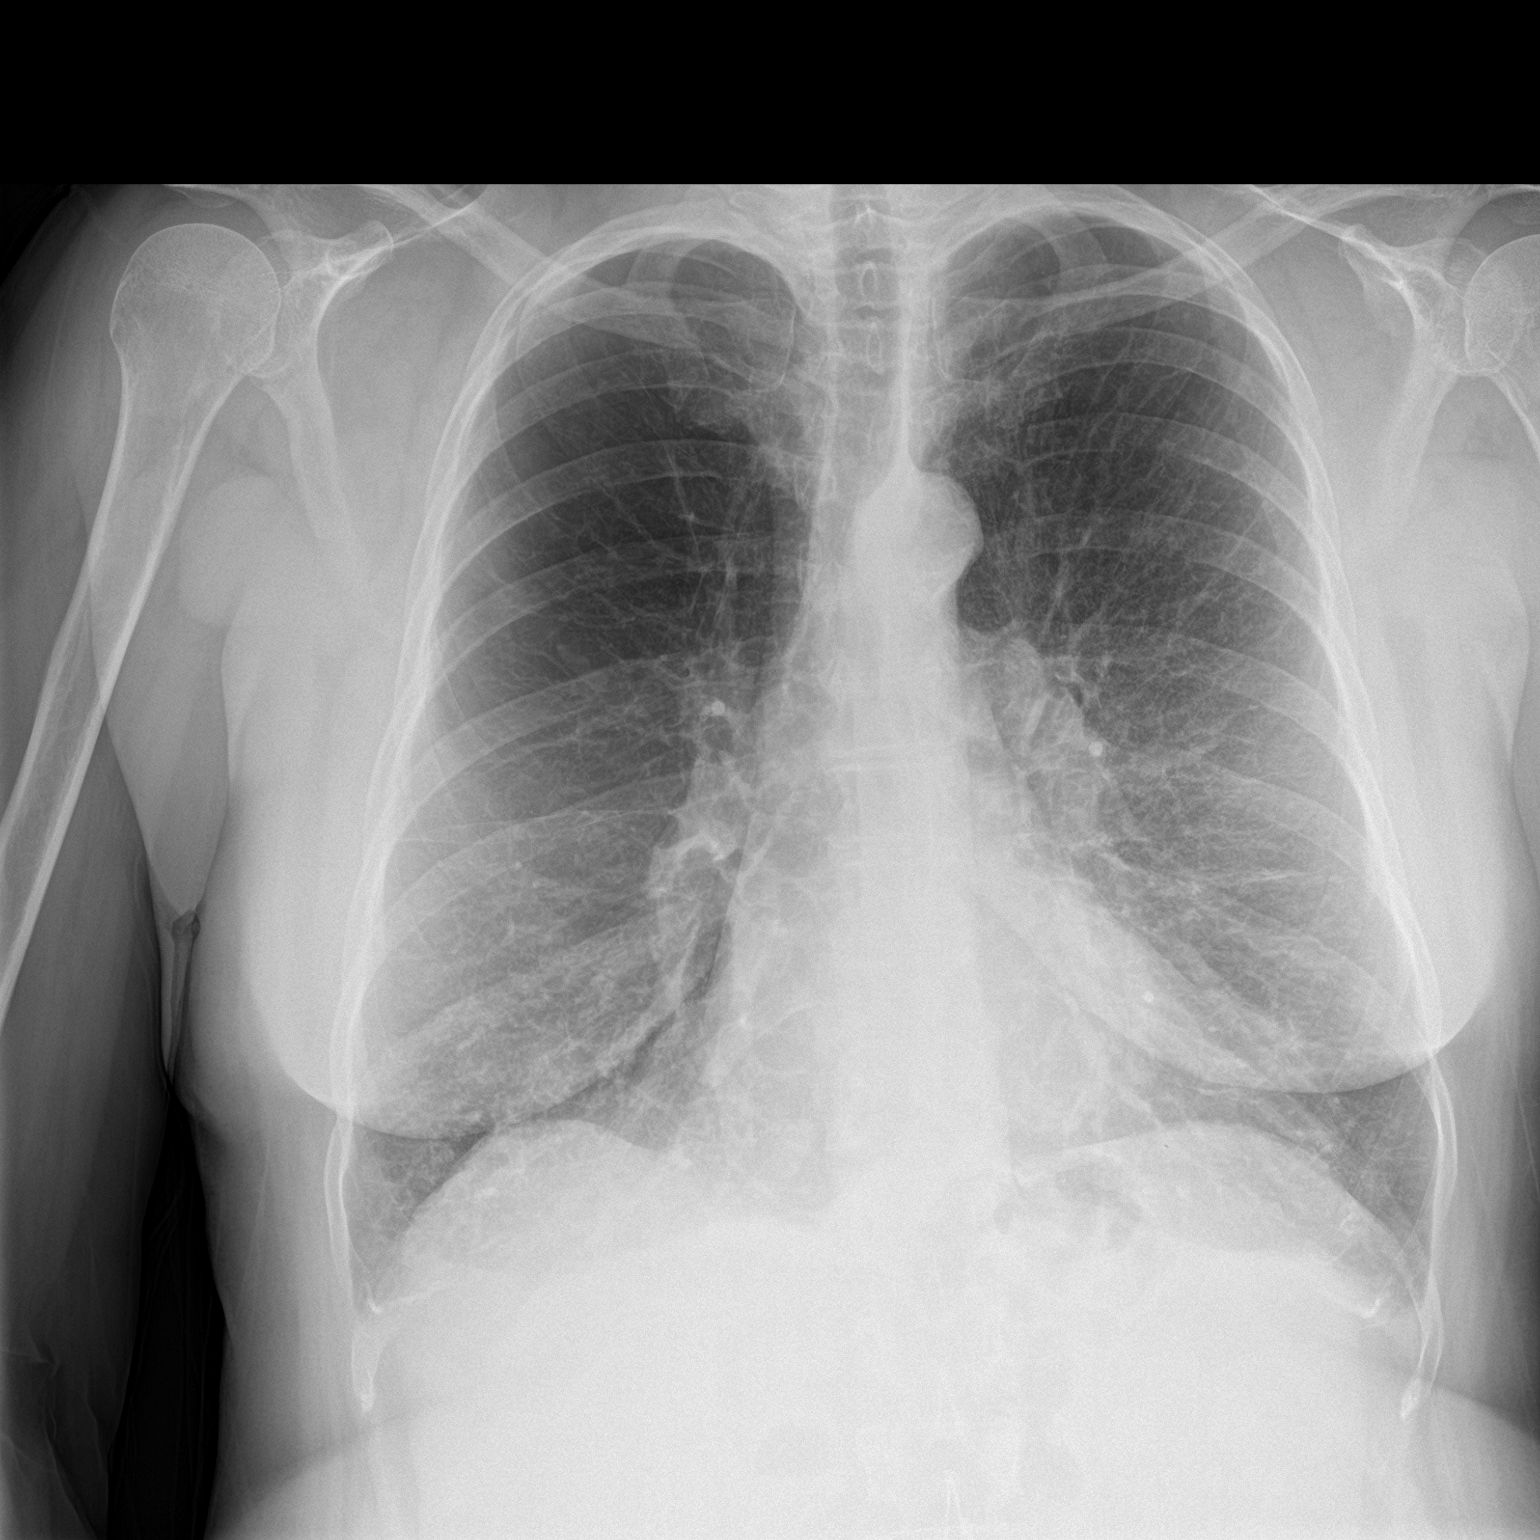

[chest lat]
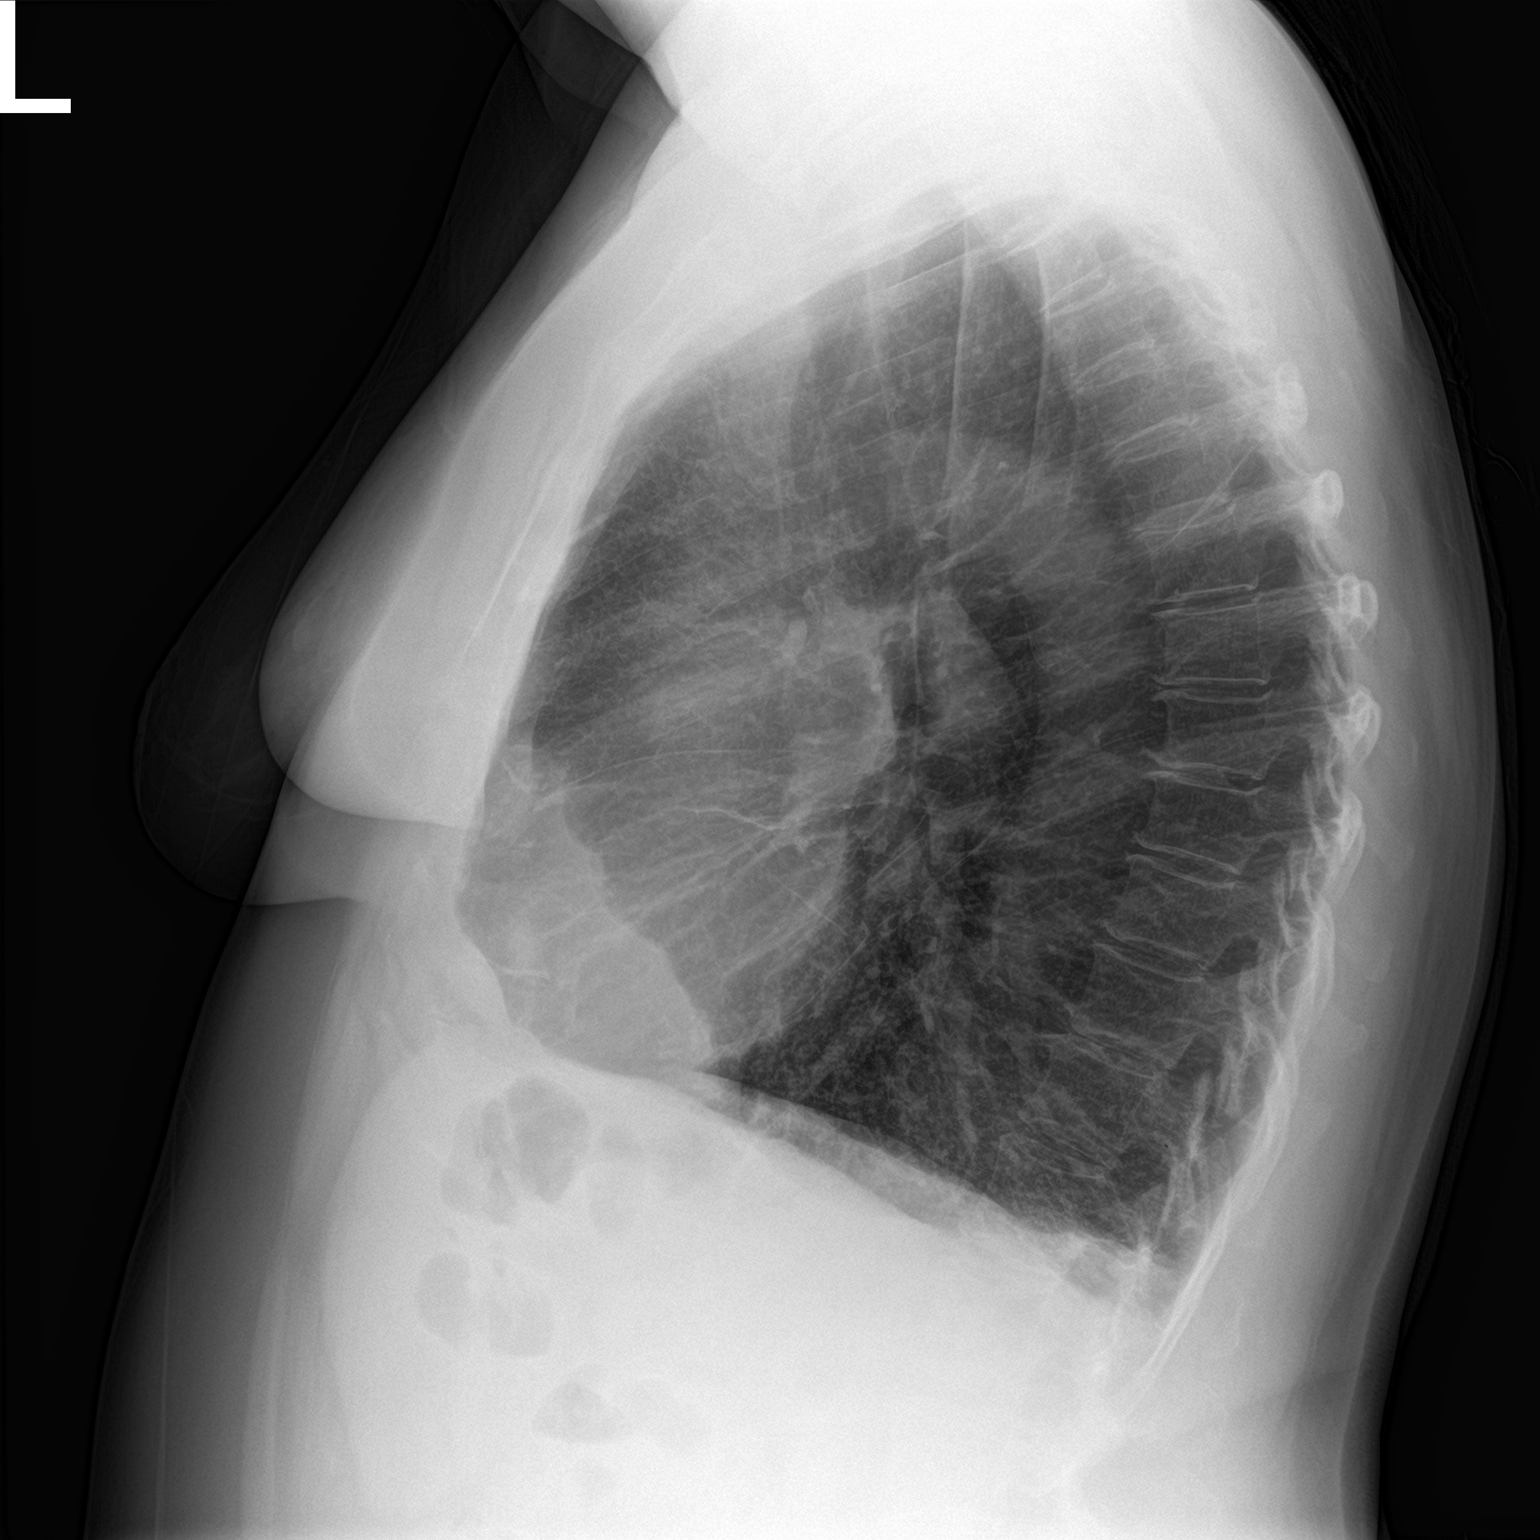

[2 of 2 positions shown; findings below may reference images not displayed]

FINDINGS: The heart size and mediastinal contours are within normal limits.
Pulmonary emphysema and mild scarring again seen bilaterally. No
evidence of pulmonary infiltrate or edema. No evidence of pleural
effusion.
IMPRESSION: Stable exam.  Emphysema.  No active disease.

## 2015-08-09 NOTE — Progress Notes (Signed)
Quick Note:  No pneumonia on xray today. ______

## 2015-08-09 NOTE — Assessment & Plan Note (Signed)
Unclear etiology likely simple viral process. We'll broaden diagnosis at this time. Plan for chest x-ray, metabolic panel, CBC, thyroid evaluation, urine culture. Will call patient with results.

## 2015-08-09 NOTE — Progress Notes (Signed)
Diana Solis is a 59 y.o. female who presents to [facility]  today for subjective fevers and chills. Symptoms present for the last few days. Patient was seen on September 22 for the same. She was thought to have viral sinusitis. Patient was given Augmentin to take as a backup if she doesn't improve. Patient has not improved over the weekend and started taking Augmentin. She does not feel any better. She denies any urinary frequency or dysuria. She denies any abdominal pain cough or trouble breathing. She denies significant facial pain or pressure. She states that she is just no better. She's tried some over-the-counter medicines which help a little.   Past Medical History  Diagnosis Date  . Urinary incontinence 10/19/2011  . DIVERTICULOSIS, COLON 12/10/2010    Qualifier: Diagnosis of  By: Orson Aloe MD, Tinnie Gens    . GLAUCOMA 09/29/2009    Qualifier: Diagnosis of  By: Linford Arnold MD, Santina Evans     Past Surgical History  Procedure Laterality Date  . Tonsillectomy    . Vaginal hysterectomy    . Incontinence surgery    . Breast cyst excision     Social History  Substance Use Topics  . Smoking status: Current Every Day Smoker -- 0.50 packs/day for 40 years    Types: Cigarettes  . Smokeless tobacco: Current User     Comment: e-cig  . Alcohol Use: 3.0 oz/week    6 Standard drinks or equivalent per week   family history includes Asthma in her mother; Lung cancer in her father; Lupus in her father; Thyroid disease in her mother.  ROS as above Medications: Current Outpatient Prescriptions  Medication Sig Dispense Refill  . albuterol (PROAIR HFA) 108 (90 BASE) MCG/ACT inhaler Inhale 2 puffs into the lungs every 6 (six) hours as needed for wheezing or shortness of breath. 18 g 1  . albuterol (PROVENTIL) (2.5 MG/3ML) 0.083% nebulizer solution   0  . AMBULATORY NON FORMULARY MEDICATION Please discontinue all oxygen services for this patient 1 each 0  .  amoxicillin-clavulanate (AUGMENTIN) 875-125 MG per tablet Take 1 tablet by mouth 2 (two) times daily. 14 tablet 0  . cetirizine (ZYRTEC) 5 MG tablet Take 5 mg by mouth daily.      . Fluticasone Furoate-Vilanterol (BREO ELLIPTA) 100-25 MCG/INH AEPB Inhale 1 puff into the lungs.    Marland Kitchen ipratropium (ATROVENT) 0.03 % nasal spray Place 2 sprays into both nostrils every 12 (twelve) hours. 30 mL 0  . levothyroxine (SYNTHROID, LEVOTHROID) 88 MCG tablet Take 1 tablet (88 mcg total) by mouth daily. 30 tablet 1  . ranitidine (ZANTAC) 75 MG tablet Take 75 mg by mouth 2 (two) times daily.    Marland Kitchen SPIRIVA RESPIMAT 1.25 MCG/ACT AERS   0  . fluticasone (FLONASE) 50 MCG/ACT nasal spray Two sprays each nostril daily. 16 g 3   No current facility-administered medications for this visit.   No Known Allergies   Exam:  BP 127/76 mmHg  Pulse 82  Temp(Src) 99.3 F (37.4 C) (Oral)  Wt 169 lb (76.658 kg) Gen: Well NAD HEENT: EOMI,  MMM clear nasal discharge. Posterior pharynx cobblestoning. Maxillary sinuses are nontender. Normal tympanic membranes bilaterally. Lungs: Normal work of breathing. CTABL Heart: RRR no MRG Abd: NABS, Soft. Nondistended, Nontender Exts: Brisk capillary refill, warm and well perfused.   Results for orders placed or performed in visit on 08/09/15 (from the past 24 hour(s))  POCT urinalysis dipstick     Status: None   Collection Time:  08/09/15 12:09 PM  Result Value Ref Range   Color, UA yellow    Clarity, UA clear    Glucose, UA neg    Bilirubin, UA neg    Ketones, UA neg    Spec Grav, UA <=1.005    Blood, UA neg    pH, UA 5.5    Protein, UA neg    Urobilinogen, UA 0.2    Nitrite, UA neg    Leukocytes, UA Negative Negative   No results found.   Please see individual assessment and plan sections.

## 2015-08-09 NOTE — Patient Instructions (Signed)
Thank you for coming in today. Continue current treatment.  Return in a few days if not better.  We will get xrays and labs today to help figure out why you are sick.  Call or go to the emergency room if you get worse, have trouble breathing, have chest pains, or palpitations.   Fever, Adult A fever is a temperature of 100.4 F (38 C) or above.  HOME CARE  Take fever medicine as told by your doctor. Do not  take aspirin for fever if you are younger than 59 years of age.  If you are given antibiotic medicine, take it as told. Finish the medicine even if you start to feel better.  Rest.  Drink enough fluids to keep your pee (urine) clear or pale yellow. Do not drink alcohol.  Take a bath or shower with room temperature water. Do not use ice water or alcohol sponge baths.  Wear lightweight, loose clothes. GET HELP RIGHT AWAY IF:   You are short of breath or have trouble breathing.  You are very weak.  You are dizzy or you pass out (faint).  You are very thirsty or are making little or no urine.  You have new pain.  You throw up (vomit) or have watery poop (diarrhea).  You keep throwing up or having watery poop for more than 1 to 2 days.  You have a stiff neck or light bothers your eyes.  You have a skin rash.  You have a fever or problems (symptoms) that last for more than 2 to 3 days.  You have a fever and your problems quickly get worse.  You keep throwing up the fluids you drink.  You do not feel better after 3 days.  You have new problems. MAKE SURE YOU:   Understand these instructions.  Will watch your condition.  Will get help right away if you are not doing well or get worse. Document Released: 08/08/2008 Document Revised: 01/22/2012 Document Reviewed: 08/31/2011 [facility] Patient Information 2015 [place], [place]. This information is not intended to replace advice given to you by your health care Markea Ruzich. Make sure you discuss any questions you have with  your health care Tyjuan Demetro.

## 2015-08-10 LAB — CBC WITH DIFFERENTIAL/PLATELET
Basophils Absolute: 0 10*3/uL (ref 0.0–0.1)
Basophils Relative: 1 % (ref 0–1)
Eosinophils Absolute: 0.2 10*3/uL (ref 0.0–0.7)
Eosinophils Relative: 5 % (ref 0–5)
HCT: 38.2 % (ref 36.0–46.0)
Hemoglobin: 13.2 g/dL (ref 12.0–15.0)
Lymphocytes Relative: 31 % (ref 12–46)
Lymphs Abs: 1.5 10*3/uL (ref 0.7–4.0)
MCH: 30.3 pg (ref 26.0–34.0)
MCHC: 34.6 g/dL (ref 30.0–36.0)
MCV: 87.8 fL (ref 78.0–100.0)
MPV: 9.2 fL (ref 8.6–12.4)
Monocytes Absolute: 0.6 10*3/uL (ref 0.1–1.0)
Monocytes Relative: 13 % — ABNORMAL HIGH (ref 3–12)
Neutro Abs: 2.4 10*3/uL (ref 1.7–7.7)
Neutrophils Relative %: 50 % (ref 43–77)
Platelets: 323 10*3/uL (ref 150–400)
RBC: 4.35 MIL/uL (ref 3.87–5.11)
RDW: 13.2 % (ref 11.5–15.5)
WBC: 4.8 10*3/uL (ref 4.0–10.5)

## 2015-08-10 LAB — T3, FREE: T3, Free: 2.4 pg/mL (ref 2.3–4.2)

## 2015-08-10 LAB — TSH: TSH: 3.297 u[IU]/mL (ref 0.350–4.500)

## 2015-08-10 LAB — T4, FREE: Free T4: 1.16 ng/dL (ref 0.80–1.80)

## 2015-08-10 NOTE — Progress Notes (Signed)
Quick Note:  Labs are normal. I dont know why Diana Solis is still sick. I think this is likely a virus.  Return later this week if not better. ______

## 2015-08-11 LAB — URINE CULTURE
Colony Count: NO GROWTH
Organism ID, Bacteria: NO GROWTH

## 2015-08-13 ENCOUNTER — Encounter: Payer: Self-pay | Admitting: Family Medicine

## 2015-08-13 ENCOUNTER — Ambulatory Visit (INDEPENDENT_AMBULATORY_CARE_PROVIDER_SITE_OTHER): Payer: PRIVATE HEALTH INSURANCE | Admitting: Family Medicine

## 2015-08-13 VITALS — BP 127/61 | HR 89 | Temp 99.9°F | Wt 172.0 lb

## 2015-08-13 DIAGNOSIS — R509 Fever, unspecified: Secondary | ICD-10-CM

## 2015-08-13 MED ORDER — LEVOFLOXACIN 500 MG PO TABS
500.0000 mg | ORAL_TABLET | Freq: Every day | ORAL | Status: DC
Start: 2015-08-13 — End: 2015-08-25

## 2015-08-13 NOTE — Progress Notes (Signed)
CC: Diana Solis isCambrya 59 y.o. female is here for Sinusitis? and Fever   Subjective: HPI:  Complains of 1-1/2 weeks of subjective fever. She took her temperature this morning was 10 1.0. She's been managing this with Tylenol, last dose was taken last night. No other interventions as of yet other than getting Augmentin last week which she has been taking as prescribed. She tells me she feels fatigued and somewhat achy. Symptoms have not gotten better or worse since onset. The only other abnormality that she has felt is a little bit of pressure in the forehead, her ears and facial bones. Symptoms are moderate in severity on a daily basis. Denies chest pain, shortness of breath, wheezing, rash, joint pain, photophobia, confusion, nor neck pain. Denies abdominal pain, diarrhea, constipation, dysuria or urinary frequency.   Review Of Systems Outlined In HPI  Past Medical History  Diagnosis Date  . Urinary incontinence 10/19/2011  . DIVERTICULOSIS, COLON 12/10/2010    Qualifier: Diagnosis of  By: Orson Aloe MD, Tinnie Gens    . GLAUCOMA 09/29/2009    Qualifier: Diagnosis of  By: Linford Arnold MD, Santina Evans      Past Surgical History  Procedure Laterality Date  . Tonsillectomy    . Vaginal hysterectomy    . Incontinence surgery    . Breast cyst excision     Family History  Problem Relation Age of Onset  . Asthma Mother   . Lung cancer Father     smoker  . Lupus Father   . Thyroid disease Mother     Social History   Social History  . Marital Status: Married    Spouse Name: Diana Solis.   . Number of Children: 1  . Years of Education: N/A   Occupational History  . Not on file.   Social History Main Topics  . Smoking status: Current Every Day Smoker -- 0.50 packs/day for 40 years    Types: Cigarettes  . Smokeless tobacco: Current User     Comment: e-cig  . Alcohol Use: 3.0 oz/week    6 Standard drinks or equivalent per week  . Drug Use: No  . Sexual Activity: Not on file   Other Topics  Concern  . Not on file   Social History Narrative   No regular exercise. 3- 4 caffeine drinks per day. 3 grandkids.      Objective: BP 127/61 mmHg  Pulse 89  Temp(Src) 99.9 F (37.7 C) (Oral)  Wt 172 lb (78.019 kg)  SpO2 95%  General: Alert and Oriented, No Acute Distress HEENT: Pupils equal, round, reactive to light. Conjunctivae clear.  External ears unremarkable, canals clear with intact TMs with appropriate landmarks.  Middle ear appears open without effusion. Pink inferior turbinates.  Moist mucous membranes, pharynx without inflammation nor lesions.  Neck supple without palpable lymphadenopathy nor abnormal masses. Lungs: Clear to auscultation bilaterally, no wheezing/ronchi/rales.  Comfortable work of breathing. Good air movement. Cardiac: Regular rate and rhythm. Normal S1/S2.  No murmurs, rubs, nor gallops.   Abdomen: Normal bowel sounds, soft and non tender without palpable masses. Extremities: No peripheral edema.  Strong peripheral pulses.  Mental Status: No depression, anxiety, nor agitation. Skin: Warm and dry.  Assessment & Plan: Diana Solis was seen today for sinusitis? and fever.  Diagnoses and all orders for this visit:  Fever, unspecified fever cause -     levofloxacin (LEVAQUIN) 500 MG tablet; Take 1 tablet (500 mg total) by mouth daily.   Her history and exam does not provide any  clear source of what's causing her fever however given her sinus pressure but only speculate that she has a sinus infection that is not responding to Augmentin therefore start levofloxacin. I also encouraged her to take prednisone however she tells me side effects of prednisone or worsen the symptoms she started experiencing right now.  Return if symptoms worsen or fail to improve.

## 2015-08-17 ENCOUNTER — Ambulatory Visit: Payer: PRIVATE HEALTH INSURANCE | Admitting: Family Medicine

## 2015-08-18 ENCOUNTER — Ambulatory Visit (INDEPENDENT_AMBULATORY_CARE_PROVIDER_SITE_OTHER): Payer: PRIVATE HEALTH INSURANCE | Admitting: Osteopathic Medicine

## 2015-08-18 ENCOUNTER — Other Ambulatory Visit: Payer: Self-pay | Admitting: Osteopathic Medicine

## 2015-08-18 ENCOUNTER — Encounter: Payer: Self-pay | Admitting: Osteopathic Medicine

## 2015-08-18 VITALS — BP 127/70 | HR 86 | Temp 98.8°F | Wt 167.0 lb

## 2015-08-18 DIAGNOSIS — R509 Fever, unspecified: Secondary | ICD-10-CM | POA: Diagnosis not present

## 2015-08-18 NOTE — Patient Instructions (Signed)
Get labs done today, if you don't have time today to do blood cultures please come back for these.  Can continue with Tylenol as needed. Finish your antibiotics. We will call you when results of labs are available.   Follow up in one week if you are not feeling better, sooner if you are worse or if any other concerns.  We may have to proceed to imaging studies, TB test or other workup.

## 2015-08-18 NOTE — Progress Notes (Signed)
HPI: Diana Solis is a 59 y.o. female who presents to [place]  today for chief complaint of:  Chief Complaint  Patient presents with  . Fever    . Quality: feels hot, sweating . Severity: moderate . Duration: 2 weeks . Timing: comes and goes . Modifying factors: taking temperature at home, axillary going from 99.0 - 101.0. Last tylenol yesterday evening. Has been on augmentin and levaquin, using flonase and zyrtec.   . Assoc signs/symptoms: sinus headache 2 weeks   Past medical, social and family history reviewed: Past Medical History  Diagnosis Date  . Urinary incontinence 10/19/2011  . DIVERTICULOSIS, COLON 12/10/2010    Qualifier: Diagnosis of  By: Koleen Nimrod MD, Dellis Filbert    . GLAUCOMA 09/29/2009    Qualifier: Diagnosis of  By: Madilyn Fireman MD, Barnetta Chapel     Past Surgical History  Procedure Laterality Date  . Tonsillectomy    . Vaginal hysterectomy    . Incontinence surgery    . Breast cyst excision     Social History  Substance Use Topics  . Smoking status: Former Smoker -- 0.50 packs/day for 40 years    Types: Cigarettes  . Smokeless tobacco: Current User     Comment: e-cig  . Alcohol Use: 3.6 oz/week    6 Standard drinks or equivalent per week   Family History  Problem Relation Age of Onset  . Asthma Mother   . Lung cancer Father     smoker  . Lupus Father   . Thyroid disease Mother     Current Outpatient Prescriptions  Medication Sig Dispense Refill  . albuterol (PROAIR HFA) 108 (90 BASE) MCG/ACT inhaler Inhale 2 puffs into the lungs every 6 (six) hours as needed for wheezing or shortness of breath. 18 g 1  . albuterol (PROVENTIL) (2.5 MG/3ML) 0.083% nebulizer solution   0  . AMBULATORY NON FORMULARY MEDICATION Please discontinue all oxygen services for this patient 1 each 0  . cetirizine (ZYRTEC) 5 MG tablet Take 5 mg by mouth daily.      . Fluticasone Furoate-Vilanterol (BREO ELLIPTA) 100-25 MCG/INH AEPB Inhale 1 puff  into the lungs.    Marland Kitchen ipratropium (ATROVENT) 0.03 % nasal spray Place 2 sprays into both nostrils every 12 (twelve) hours. 30 mL 0  . levofloxacin (LEVAQUIN) 500 MG tablet Take 1 tablet (500 mg total) by mouth daily. 8 tablet 0  . levothyroxine (SYNTHROID, LEVOTHROID) 88 MCG tablet Take 1 tablet (88 mcg total) by mouth daily. 30 tablet 1  . ranitidine (ZANTAC) 75 MG tablet Take 75 mg by mouth 2 (two) times daily.    Marland Kitchen SPIRIVA RESPIMAT 1.25 MCG/ACT AERS   0  . fluticasone (FLONASE) 50 MCG/ACT nasal spray Two sprays each nostril daily. 16 g 3   No current facility-administered medications for this visit.   No Known Allergies    Review of Systems: CONSTITUTIONAL: (+) fever/chills, no unintentional weight changes HEAD/EYES/EARS/NOSE/THROAT: No headache/vision change or hearing change, no sore throat, (+) persistent sinus pressure CARDIAC: No chest pain/pressure/palpitations, no orthopnea RESPIRATORY: No cough/shortness of breath/wheeze GASTROINTESTINAL: No nausea/vomiting/abdominal pain MUSCULOSKELETAL: (+) generalized myalgia/arthralgia SKIN: No rash/wounds/concerning lesions, states she gets rash in the sun HEM/ONC: No easy bruising/bleeding, no abnormal lymph node ENDOCRINE: No polyuria/polydipsia/polyphagia, no heat/cold intolerance  NEUROLOGIC: No dizzines/slurred speech   Exam:  BP 127/70 mmHg  Pulse 86  Temp(Src) 98.8 F (37.1 C) (Oral)  Wt 167 lb (75.751 kg)  SpO2 91% Constitutional: VSS, see above. General Appearance: alert, well-developed,  well-nourished, NAD Eyes: Normal lids and conjunctive, non-icteric sclera, PERRLA Ears, Nose, Mouth, Throat: Normal external inspection ears/nares/mouth/lips/gums, Normal TM bilaterally, MMM, posterior pharynx without erythema/exudate Neck: No masses, trachea midline. No thyroid enlargement/tenderness/mass appreciated Respiratory: Normal respiratory effort. no wheeze/rhonchi/rales Cardiovascular: S1/S2 normal, no murmur/rub/gallop  auscultated. RRR. No carotid bruit or JVD. No abdominal aortic bruit. Pedal pulse II/IV bilaterally DP and PT. No lower extremity edema.   No results found for this or any previous visit (from the past 72 hour(s)).    ASSESSMENT/PLAN:  Fever, unspecified fever cause - Plan: CBC with Differential/Platelet, COMPLETE METABOLIC PANEL WITH GFR, Sed Rate (ESR), C-reactive protein, Lactate Dehydrogenase (LDH), Rheumatoid Factor, CK, Antinuclear Antib (ANA), Culture, blood (single), Culture, blood (single), Serum protein electrophoresis with reflex   Given persistent symptoms over two weeks and previous basic w/u negative, at this point will extend workup to include labs as above despite not technically meeting criteria of 3 weeks fever for FUO. Pt doesn't have time to get blood cultures today, separate order written for these but she is advised she needs this test done too. Consider XR/CT if no improvement. She is concerned about Lupus, advised some of the acute-phase reactants may be elevated but this does not necessarily mean she has a diagnosis, will discuss labs further when results available. Advised there is often no definitive diagnosis found in cases of fever such as this, likely a viral illness particularly since multiple abx have not improved her sinus problems but will consider head CT if still complaining of sinus problems. (See Approach to the adult with fever of unknown origin - UpToDate). Declined HIV test. Would consider TB test as well. Advised proper technique for taking temp at home.

## 2015-08-19 LAB — SEDIMENTATION RATE: Sed Rate: 89 mm/hr — ABNORMAL HIGH (ref 0–30)

## 2015-08-19 LAB — CBC WITH DIFFERENTIAL/PLATELET
Basophils Absolute: 0.1 10*3/uL (ref 0.0–0.1)
Basophils Relative: 1 % (ref 0–1)
Eosinophils Absolute: 0.3 10*3/uL (ref 0.0–0.7)
Eosinophils Relative: 6 % — ABNORMAL HIGH (ref 0–5)
HCT: 37.1 % (ref 36.0–46.0)
Hemoglobin: 12.6 g/dL (ref 12.0–15.0)
Lymphocytes Relative: 25 % (ref 12–46)
Lymphs Abs: 1.3 10*3/uL (ref 0.7–4.0)
MCH: 29.6 pg (ref 26.0–34.0)
MCHC: 34 g/dL (ref 30.0–36.0)
MCV: 87.1 fL (ref 78.0–100.0)
MPV: 9.3 fL (ref 8.6–12.4)
Monocytes Absolute: 0.6 10*3/uL (ref 0.1–1.0)
Monocytes Relative: 12 % (ref 3–12)
Neutro Abs: 2.9 10*3/uL (ref 1.7–7.7)
Neutrophils Relative %: 56 % (ref 43–77)
Platelets: 373 10*3/uL (ref 150–400)
RBC: 4.26 MIL/uL (ref 3.87–5.11)
RDW: 12.7 % (ref 11.5–15.5)
WBC: 5.1 10*3/uL (ref 4.0–10.5)

## 2015-08-19 LAB — COMPLETE METABOLIC PANEL WITH GFR
ALT: 17 U/L (ref 6–29)
AST: 16 U/L (ref 10–35)
Albumin: 4.1 g/dL (ref 3.6–5.1)
Alkaline Phosphatase: 137 U/L — ABNORMAL HIGH (ref 33–130)
BUN: 14 mg/dL (ref 7–25)
CO2: 27 mmol/L (ref 20–31)
Calcium: 8.9 mg/dL (ref 8.6–10.4)
Chloride: 98 mmol/L (ref 98–110)
Creat: 0.65 mg/dL (ref 0.50–1.05)
GFR, Est African American: >89 mL/min (ref >=60)
GFR, Est Non African American: >89 mL/min (ref >=60)
Glucose, Bld: 115 mg/dL — ABNORMAL HIGH (ref 65–99)
Potassium: 3.9 mmol/L (ref 3.5–5.3)
Sodium: 135 mmol/L (ref 135–146)
Total Bilirubin: 0.4 mg/dL (ref 0.2–1.2)
Total Protein: 7.1 g/dL (ref 6.1–8.1)

## 2015-08-19 LAB — LACTATE DEHYDROGENASE: LDH: 190 U/L (ref 94–250)

## 2015-08-19 LAB — C-REACTIVE PROTEIN: CRP: 6.4 mg/dL — ABNORMAL HIGH (ref <0.60)

## 2015-08-19 LAB — ANA: Anti Nuclear Antibody(ANA): NEGATIVE

## 2015-08-19 LAB — CK: Total CK: 58 U/L (ref 7–177)

## 2015-08-19 LAB — RHEUMATOID FACTOR: Rhuematoid fact SerPl-aCnc: <10 IU/mL (ref <=14)

## 2015-08-20 LAB — PROTEIN ELECTROPHORESIS, SERUM, WITH REFLEX
Albumin ELP: 3.6 g/dL — ABNORMAL LOW (ref 3.8–4.8)
Alpha-1-Globulin: 0.6 g/dL — ABNORMAL HIGH (ref 0.2–0.3)
Alpha-2-Globulin: 1.3 g/dL — ABNORMAL HIGH (ref 0.5–0.9)
Beta 2: 0.4 g/dL (ref 0.2–0.5)
Beta Globulin: 0.4 g/dL (ref 0.4–0.6)
Gamma Globulin: 1 g/dL (ref 0.8–1.7)
Total Protein, Serum Electrophoresis: 7.3 g/dL (ref 6.1–8.1)

## 2015-08-20 LAB — IGG, IGA, IGM
IgA: 273 mg/dL (ref 69–380)
IgG (Immunoglobin G), Serum: 1050 mg/dL (ref 690–1700)
IgM, Serum: 61 mg/dL (ref 52–322)

## 2015-08-20 LAB — IFE INTERPRETATION

## 2015-08-24 ENCOUNTER — Other Ambulatory Visit: Payer: Self-pay | Admitting: Family Medicine

## 2015-08-24 MED ORDER — LEVOTHYROXINE SODIUM 88 MCG PO TABS: 88.0000 ug | ORAL_TABLET | Freq: Every day | ORAL | Status: DC

## 2015-08-25 ENCOUNTER — Ambulatory Visit (INDEPENDENT_AMBULATORY_CARE_PROVIDER_SITE_OTHER): Payer: PRIVATE HEALTH INSURANCE

## 2015-08-25 ENCOUNTER — Encounter: Payer: Self-pay | Admitting: Osteopathic Medicine

## 2015-08-25 ENCOUNTER — Ambulatory Visit (INDEPENDENT_AMBULATORY_CARE_PROVIDER_SITE_OTHER): Payer: PRIVATE HEALTH INSURANCE | Admitting: Osteopathic Medicine

## 2015-08-25 VITALS — BP 115/65 | HR 84 | Wt 167.0 lb

## 2015-08-25 DIAGNOSIS — J449 Chronic obstructive pulmonary disease, unspecified: Secondary | ICD-10-CM

## 2015-08-25 DIAGNOSIS — J441 Chronic obstructive pulmonary disease with (acute) exacerbation: Secondary | ICD-10-CM

## 2015-08-25 DIAGNOSIS — J189 Pneumonia, unspecified organism: Secondary | ICD-10-CM | POA: Diagnosis not present

## 2015-08-25 DIAGNOSIS — R509 Fever, unspecified: Secondary | ICD-10-CM

## 2015-08-25 LAB — CULTURE, BLOOD (SINGLE)
Organism ID, Bacteria: NO GROWTH
Organism ID, Bacteria: NO GROWTH

## 2015-08-25 IMAGING — CR DG CHEST 2V
2 series · 2 of 2 positions shown · non-contrast
Comparison: PA and lateral chest x-ray August 09, 2015

CLINICAL DATA: Fever for the past 3 weeks without other symptoms

EXAM:
CHEST  2 VIEW

[chest pa]
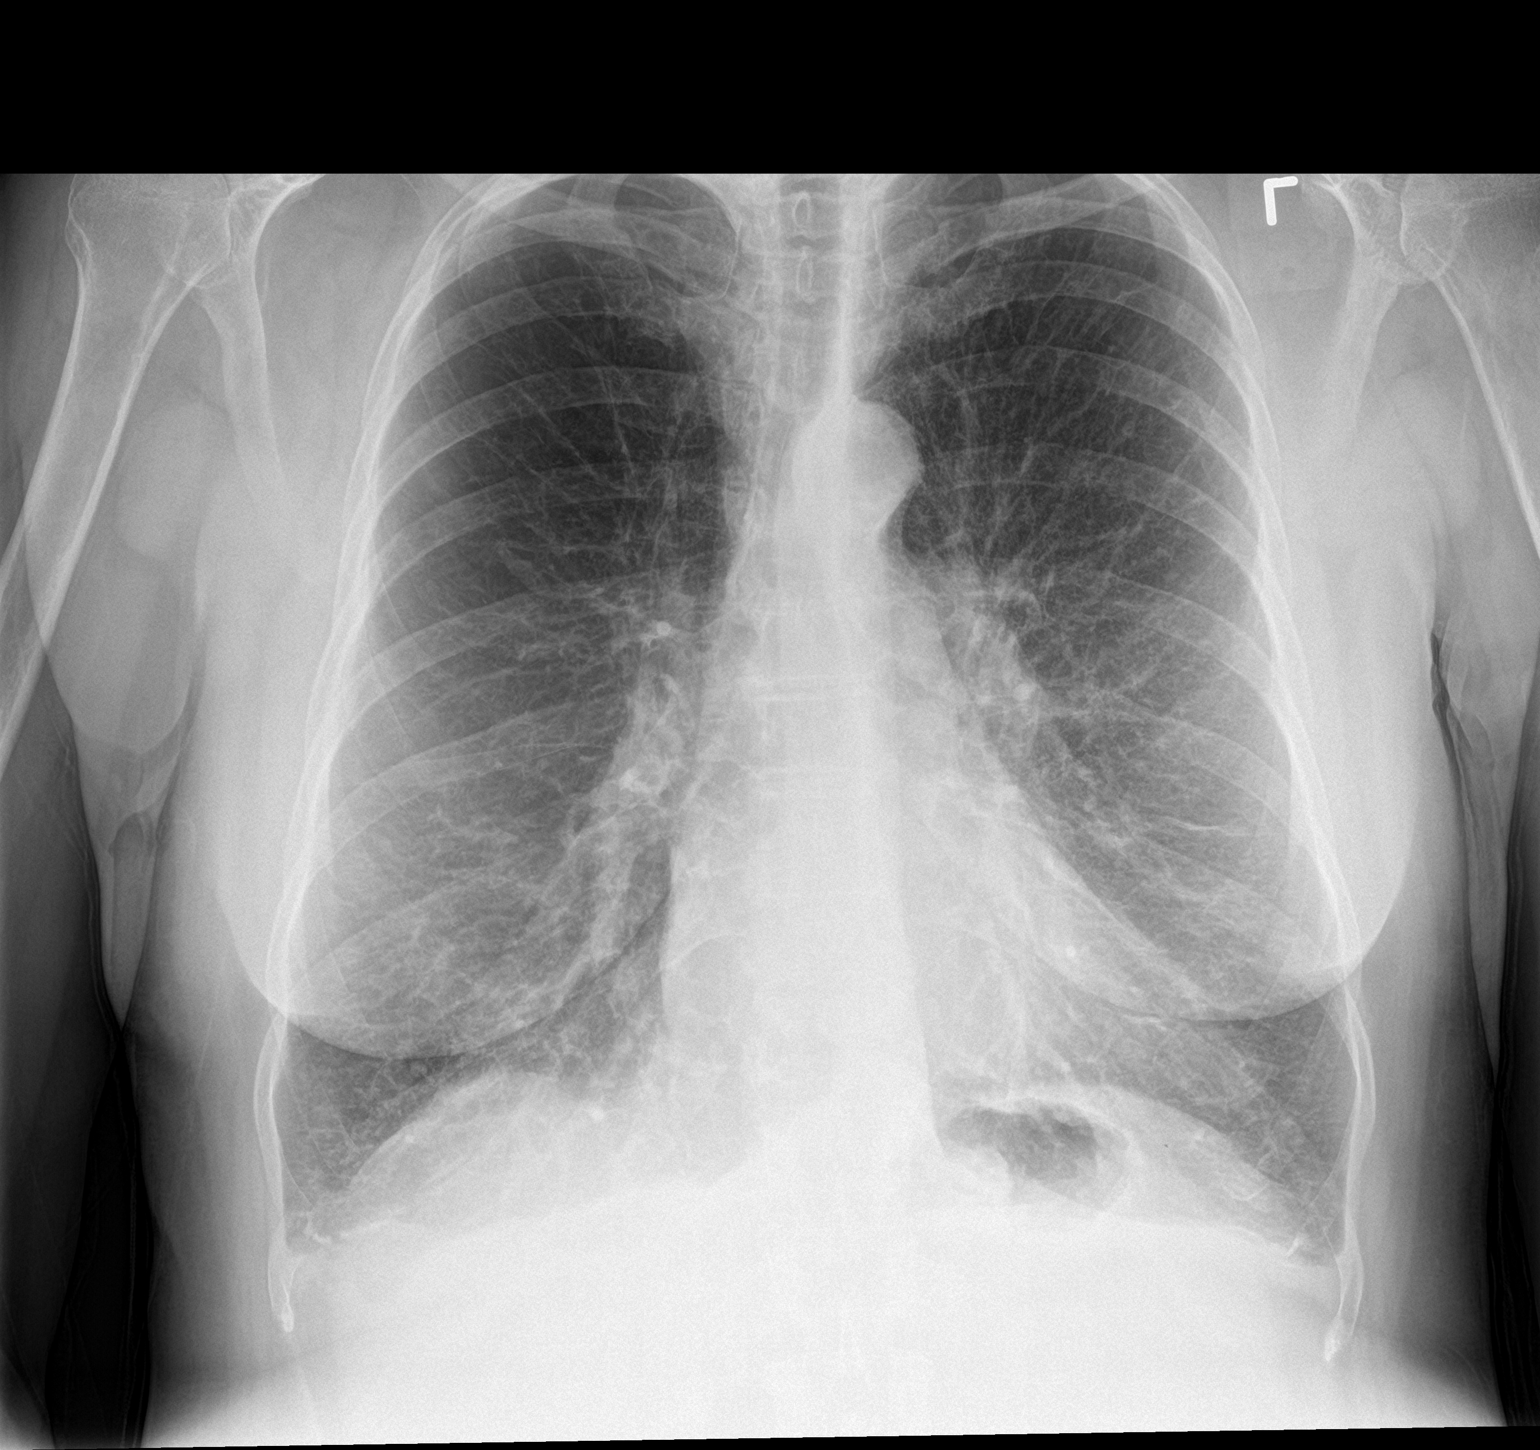

[chest lat]
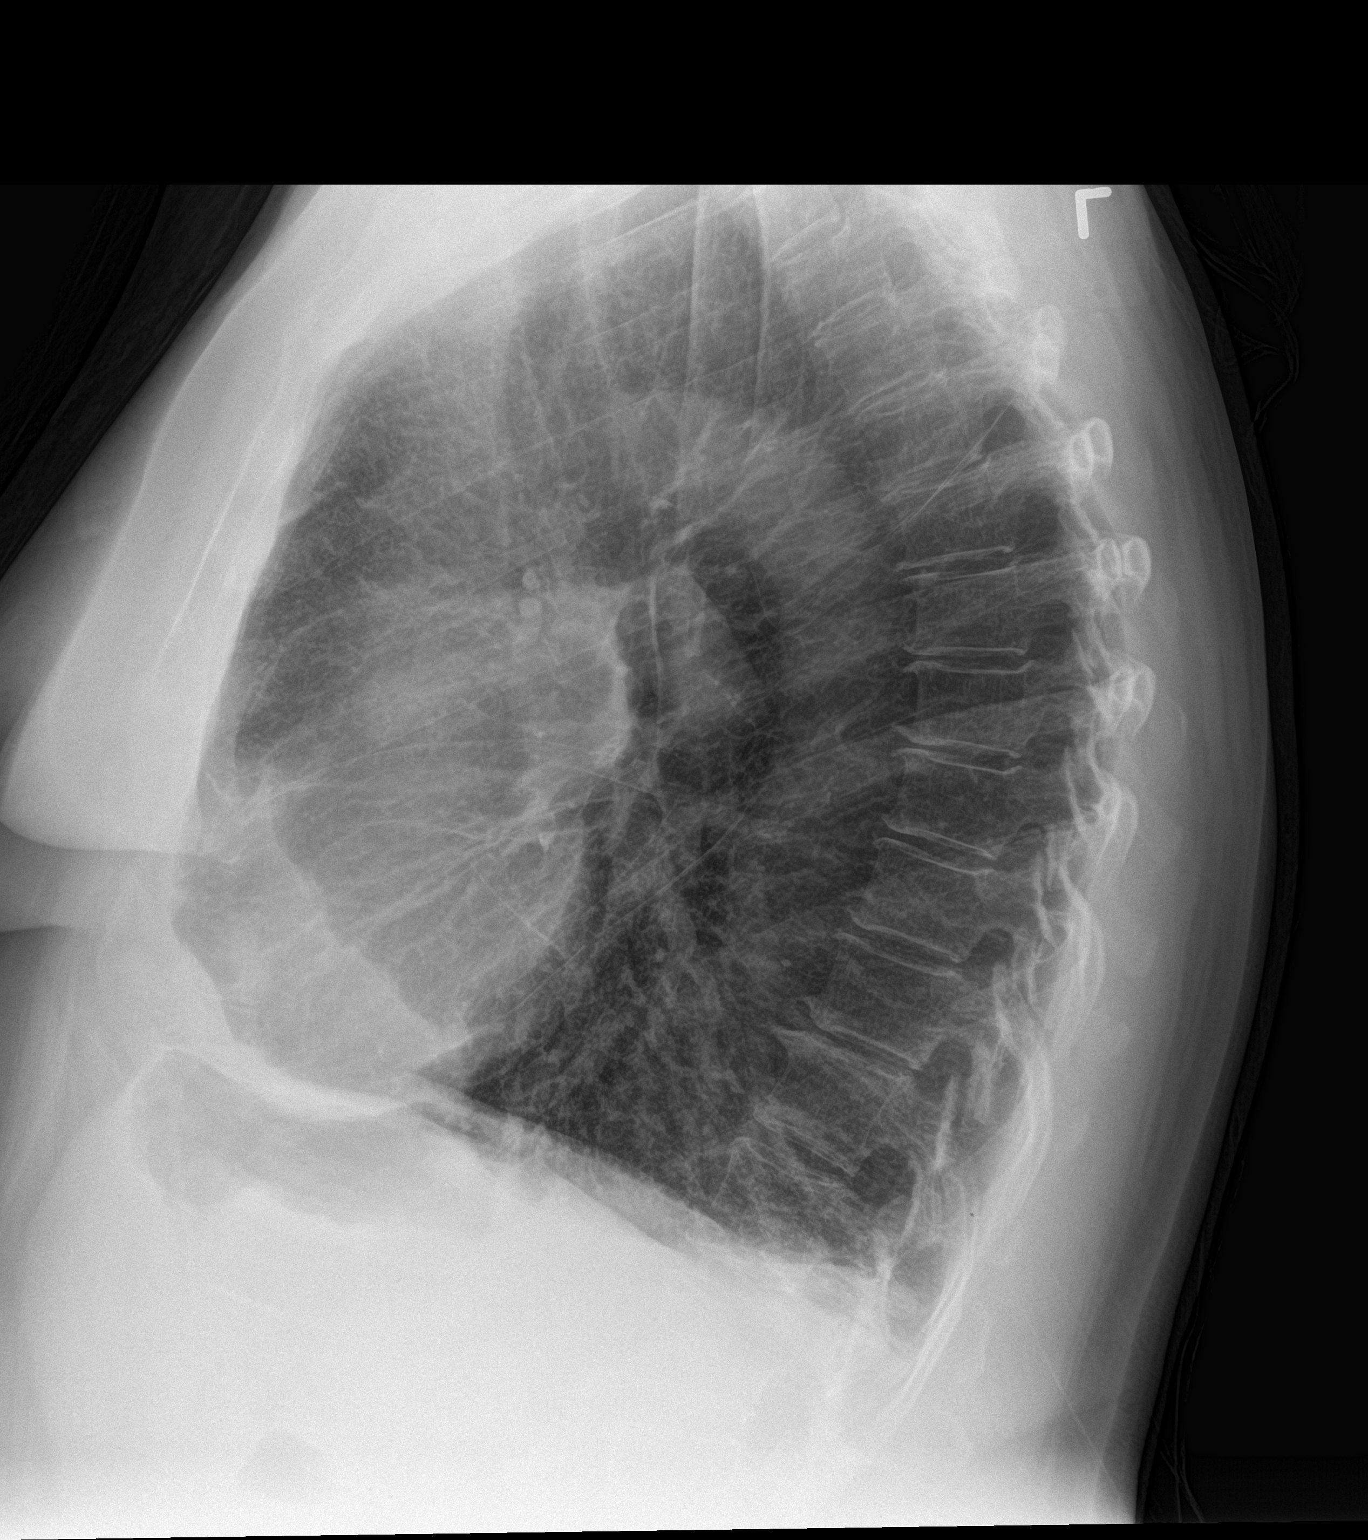

[2 of 2 positions shown; findings below may reference images not displayed]

FINDINGS: The lungs are hyperinflated with hemidiaphragm flattening and
increased AP dimension of the thorax. The interstitial markings are
more conspicuous bilaterally today. There is no alveolar infiltrate.
There is no pleural effusion. The heart and pulmonary vascularity
are normal. The bony thorax exhibits no acute abnormality.
IMPRESSION: COPD with early interstitial pneumonia or progressive subsegmental
atelectasis bilaterally.

Follow-up radiographs following anticipated antibiotic therapy are
recommended to assure clearing.

## 2015-08-25 MED ORDER — AMOXICILLIN-POT CLAVULANATE ER 1000-62.5 MG PO TB12: 2.0000 | ORAL_TABLET | Freq: Two times a day (BID) | ORAL | Status: DC

## 2015-08-25 MED ORDER — PREDNISONE 20 MG PO TABS
20.0000 mg | ORAL_TABLET | Freq: Two times a day (BID) | ORAL | Status: DC
Start: 2015-08-25 — End: 2015-09-01

## 2015-08-25 MED ORDER — AZITHROMYCIN 250 MG PO TABS: ORAL_TABLET | ORAL | Status: DC

## 2015-08-25 NOTE — Progress Notes (Signed)
HPI: Diana Solis is a 59 y.o. female [facility] for chief complaint of:  Chief Complaint  Patient presents with  . Acute Visit    fever    . Quality: feels hot, sweating . Severity: moderate . Duration: 3 weeks . Timing: comes and goes . Modifying factors: taking temperature at home, axillary going from 99.0 - 101.0 and has bought new thermometer and temps still 101 measured in mouth. Last tylenol yesterday evening. Has been on augmentin and levaquin, using flonase and zyrtec.  . Assoc signs/symptoms: sinus headache 2 weeks   Past medical, social and family history reviewed: Past Medical History  Diagnosis Date  . Urinary incontinence 10/19/2011  . DIVERTICULOSIS, COLON 12/10/2010    Qualifier: Diagnosis of  By: Orson AloeHenderson MD, Tinnie GensJeffrey    . GLAUCOMA 09/29/2009    Qualifier: Diagnosis of  By: Linford ArnoldMetheney MD, Santina Evansatherine     Past Surgical History  Procedure Laterality Date  . Tonsillectomy    . Vaginal hysterectomy    . Incontinence surgery    . Breast cyst excision     Social History  Substance Use Topics  . Smoking status: Former Smoker -- 0.50 packs/day for 40 years    Types: Cigarettes  . Smokeless tobacco: Current User     Comment: e-cig  . Alcohol Use: 3.6 oz/week    6 Standard drinks or equivalent per week   Family History  Problem Relation Age of Onset  . Asthma Mother   . Lung cancer Father     smoker  . Lupus Father   . Thyroid disease Mother     Current Outpatient Prescriptions  Medication Sig Dispense Refill  . albuterol (PROAIR HFA) 108 (90 BASE) MCG/ACT inhaler Inhale 2 puffs into the lungs every 6 (six) hours as needed for wheezing or shortness of breath. 18 g 1  . AMBULATORY NON FORMULARY MEDICATION Please discontinue all oxygen services for this patient 1 each 0  . cetirizine (ZYRTEC) 5 MG tablet Take 5 mg by mouth daily.      . Fluticasone Furoate-Vilanterol (BREO ELLIPTA) 100-25 MCG/INH AEPB  Inhale 1 puff into the lungs.    Marland Kitchen. levothyroxine (SYNTHROID, LEVOTHROID) 88 MCG tablet Take 1 tablet (88 mcg total) by mouth daily. 30 tablet 1  . omeprazole (PRILOSEC) 10 MG capsule Take 10 mg by mouth daily.    Marland Kitchen. SPIRIVA RESPIMAT 1.25 MCG/ACT AERS   0  . albuterol (PROVENTIL) (2.5 MG/3ML) 0.083% nebulizer solution   0  . fluticasone (FLONASE) 50 MCG/ACT nasal spray Two sprays each nostril daily. 16 g 3  . ipratropium (ATROVENT) 0.03 % nasal spray Place 2 sprays into both nostrils every 12 (twelve) hours. (Patient not taking: Reported on 08/25/2015) 30 mL 0  . levofloxacin (LEVAQUIN) 500 MG tablet Take 1 tablet (500 mg total) by mouth daily. (Patient not taking: Reported on 08/25/2015) 8 tablet 0   No current facility-administered medications for this visit.   No Known Allergies    Review of Systems: stable from previous exam CONSTITUTIONAL: (+) fever/chills, no unintentional weight changes HEAD/EYES/EARS/NOSE/THROAT: No headache/vision change or hearing change, no sore throat, (+) persistent sinus pressure CARDIAC: No chest pain/pressure/palpitations, no orthopnea RESPIRATORY: No cough/shortness of breath/wheeze GASTROINTESTINAL: No nausea/vomiting/abdominal pain MUSCULOSKELETAL: (+) generalized myalgia/arthralgia,  SKIN: No rash/wounds/concerning lesions, states she gets rash in the sun HEM/ONC: No easy bruising/bleeding, no abnormal lymph node ENDOCRINE: No polyuria/polydipsia/polyphagia, no heat/cold intolerance  NEUROLOGIC: No dizzines/slurred speech   Exam:  BP 115/65 mmHg  Pulse 84  Wt 167 lb (75.751 kg)  SpO2 93% Constitutional: VSS, see above. General Appearance: alert, well-developed, well-nourished, NAD    No results found for this or any previous visit (from the past 72 hour(s)).    ASSESSMENT/PLAN:  Fever, unspecified fever cause - Plan: DG Chest 2 View, TB Skin Test, Ambulatory referral to Rheumatology  Chills with fever  CAP (community acquired pneumonia)  - Plan: azithromycin (ZITHROMAX) 250 MG tablet, amoxicillin-clavulanate (AUGMENTIN XR) 1000-62.5 MG 12 hr tablet  COPD exacerbation (HCC) - Plan: predniSONE (DELTASONE) 20 MG tablet   Given persistent symptoms over 3 weeks and previous basic w/u negative, or extensive workup was positive for/acute phase reactants, blood cultures were negative, chest x-ray initially was negative however patient is still complaining of occasional cough, will get PPD today and repeat chest x-ray. Will refer to rheumatology, may need to consider other specialist workup if fever does persist, versus repeat labs to see if anything is worsening. Patient overall feels stable symptoms are just persisting. Understand her frustration, reassured that no significant/dangerous lab abnormalities and negative blood cultures are reassuring.   Advised there is often no definitive diagnosis found in cases of fever such as this, likely a viral illness particularly since multiple abx have not improved her sinus problems.    UPDATE: CXR indicates pneumonia, pt already treated with Levaquin and Augmentin for sinus infection. Will trial escalation therapy, pt called and results reviewed with her. Will escalate abx therapy and add prednisone possible COPD component. If still no better will get CT chest, consider PFT.

## 2015-08-27 LAB — TB SKIN TEST
Induration: 0 mm
TB Skin Test: NEGATIVE

## 2015-09-01 ENCOUNTER — Encounter: Payer: Self-pay | Admitting: Family Medicine

## 2015-09-01 ENCOUNTER — Ambulatory Visit (INDEPENDENT_AMBULATORY_CARE_PROVIDER_SITE_OTHER): Payer: PRIVATE HEALTH INSURANCE | Admitting: Family Medicine

## 2015-09-01 VITALS — BP 111/54 | HR 98 | Temp 97.8°F | Wt 167.0 lb

## 2015-09-01 DIAGNOSIS — E039 Hypothyroidism, unspecified: Secondary | ICD-10-CM | POA: Diagnosis not present

## 2015-09-01 DIAGNOSIS — G4733 Obstructive sleep apnea (adult) (pediatric): Secondary | ICD-10-CM | POA: Insufficient documentation

## 2015-09-01 DIAGNOSIS — J189 Pneumonia, unspecified organism: Secondary | ICD-10-CM

## 2015-09-01 NOTE — Progress Notes (Signed)
CC: Diana Solis is a 59 y.o. female is here for Pneumonia Follow Up   Subjective: HPI:  Follow-up pneumonia: she's now completed Augmentin, azithromycin and prednisone. She tells me she is having hot flashes but feels so much better compared to before taking his medications. She's no longer having any fevers or fatigue. She's having hot flashes mostly at night and symptoms are moderate in severity. They were absent up until she started prednisone. Has a nonproductive cough that she believes is back down to her baseline. She denies any shortness of breath or chest discomfort.  She wants my opinion whether or not she should have a CPAP titration performed. Back in the summer she was found to have moderate sleep apnea but she was told that it was severe in severity. She had a bad experience with her sleep study and lost respect for the local sleep lab and never followed up.  Outside records reveal moderate sleep apnea, she believes her AHI was 30. She admits to nonrestorative sleep.   Review Of Systems Outlined In HPI  Past Medical History  Diagnosis Date  . Urinary incontinence 10/19/2011  . DIVERTICULOSIS, COLON 12/10/2010    Qualifier: Diagnosis of  By: Diana Solis, Diana Solis    . GLAUCOMA 09/29/2009    Qualifier: Diagnosis of  By: Diana Solis, Diana Solis      Past Surgical History  Procedure Laterality Date  . Tonsillectomy    . Vaginal hysterectomy    . Incontinence surgery    . Breast cyst excision     Family History  Problem Relation Age of Onset  . Asthma Mother   . Lung cancer Father     smoker  . Lupus Father   . Thyroid disease Mother     Social History   Social History  . Marital Status: Married    Spouse Name: Diana Solis.   . Number of Children: 1  . Years of Education: N/A   Occupational History  . Not on file.   Social History Main Topics  . Smoking status: Former Smoker -- 0.50 packs/day for 40 years    Types: Cigarettes  . Smokeless tobacco: Current User   Comment: e-cig  . Alcohol Use: 3.6 oz/week    6 Standard drinks or equivalent per week  . Drug Use: No  . Sexual Activity: Not on file   Other Topics Concern  . Not on file   Social History Narrative   No regular exercise. 3- 4 caffeine drinks per day. 3 grandkids.      Objective: BP 111/54 mmHg  Pulse 98  Temp(Src) 97.8 F (36.6 C)  Wt 167 lb (75.751 kg)  General: Alert and Oriented, No Acute Distress HEENT: Pupils equal, round, reactive to light. Conjunctivae clear.  Moist mucous membranes Lungs: Clear to auscultation bilaterally, no wheezing/ronchi/rales.  Comfortable work of breathing. Good air movement. Cardiac: Regular rate and rhythm. Normal S1/S2.  No murmurs, rubs, nor gallops.   Extremities: No peripheral edema.  Strong peripheral pulses.  Mental Status: No depression, anxiety, nor agitation. Skin: Warm and dry.  Assessment & Plan: Diana Solis was seen today for pneumonia follow up.  Diagnoses and all orders for this visit:  OSA (obstructive sleep apnea)  CAP (community acquired pneumonia)  Hypothyroidism, unspecified hypothyroidism type   Objective sleep apnea: Encouraged her to follow through with CPAP titration, offer to arrange [facility], she tells me she will wait and think about it. Community acquired pneumonia: Resolved, she stills having some  hot flashes which I'm pretty confident in her due to recent corticosteroid use. If no better after Halloween please contact me and will look into uncontrolled thyroid abnormality / inappropriate supplementation  Return if symptoms worsen or fail to improve.

## 2015-09-06 ENCOUNTER — Ambulatory Visit: Payer: PRIVATE HEALTH INSURANCE | Admitting: Family Medicine

## 2015-10-14 ENCOUNTER — Other Ambulatory Visit: Payer: Self-pay

## 2015-10-14 DIAGNOSIS — Z1231 Encounter for screening mammogram for malignant neoplasm of breast: Secondary | ICD-10-CM

## 2015-10-14 MED ORDER — LEVOTHYROXINE SODIUM 88 MCG PO TABS: 88.0000 ug | ORAL_TABLET | Freq: Every day | ORAL | Status: DC

## 2015-11-11 ENCOUNTER — Other Ambulatory Visit: Payer: Self-pay | Admitting: Family Medicine

## 2015-11-11 DIAGNOSIS — E039 Hypothyroidism, unspecified: Secondary | ICD-10-CM

## 2015-11-12 ENCOUNTER — Ambulatory Visit (INDEPENDENT_AMBULATORY_CARE_PROVIDER_SITE_OTHER): Payer: PRIVATE HEALTH INSURANCE

## 2015-11-12 DIAGNOSIS — Z1231 Encounter for screening mammogram for malignant neoplasm of breast: Secondary | ICD-10-CM | POA: Diagnosis not present

## 2015-11-13 LAB — TSH: TSH: 3.707 u[IU]/mL (ref 0.350–4.500)

## 2015-11-13 LAB — T4, FREE: Free T4: 1.45 ng/dL (ref 0.80–1.80)

## 2015-11-13 LAB — T3, FREE: T3, Free: 3.1 pg/mL (ref 2.3–4.2)

## 2015-11-16 ENCOUNTER — Telehealth: Payer: Self-pay | Admitting: Family Medicine

## 2015-11-16 MED ORDER — LEVOTHYROXINE SODIUM 88 MCG PO TABS: 88.0000 ug | ORAL_TABLET | Freq: Every day | ORAL | Status: DC

## 2015-11-16 NOTE — Telephone Encounter (Signed)
Please let patient know that her thyroid supplement appears to be adequate, I'll send in refills of the 88 mcg formulation.

## 2015-11-16 NOTE — Telephone Encounter (Signed)
Pt.notified

## 2015-12-21 ENCOUNTER — Ambulatory Visit: Payer: PRIVATE HEALTH INSURANCE | Admitting: Family Medicine

## 2015-12-22 ENCOUNTER — Other Ambulatory Visit: Payer: PRIVATE HEALTH INSURANCE | Admitting: Osteopathic Medicine

## 2015-12-22 ENCOUNTER — Other Ambulatory Visit: Payer: PRIVATE HEALTH INSURANCE

## 2016-01-03 ENCOUNTER — Ambulatory Visit (INDEPENDENT_AMBULATORY_CARE_PROVIDER_SITE_OTHER): Payer: BLUE CROSS/BLUE SHIELD | Admitting: Family Medicine

## 2016-01-03 ENCOUNTER — Encounter: Payer: Self-pay | Admitting: Family Medicine

## 2016-01-03 VITALS — BP 135/80 | HR 63 | Wt 172.0 lb

## 2016-01-03 DIAGNOSIS — R0602 Shortness of breath: Secondary | ICD-10-CM

## 2016-01-03 DIAGNOSIS — R3915 Urgency of urination: Secondary | ICD-10-CM | POA: Diagnosis not present

## 2016-01-03 DIAGNOSIS — J439 Emphysema, unspecified: Secondary | ICD-10-CM

## 2016-01-03 LAB — POCT URINALYSIS DIPSTICK
Bilirubin, UA: NEGATIVE
Blood, UA: NEGATIVE
Glucose, UA: NEGATIVE
Ketones, UA: NEGATIVE
Nitrite, UA: NEGATIVE
Protein, UA: NEGATIVE
Spec Grav, UA: 1.01
Urobilinogen, UA: 0.2
pH, UA: 7

## 2016-01-03 MED ORDER — FLUTICASONE FUROATE-VILANTEROL 200-25 MCG/INH IN AEPB: 1.0000 | INHALATION_SPRAY | Freq: Every day | RESPIRATORY_TRACT | Status: DC

## 2016-01-03 NOTE — Progress Notes (Signed)
CC: Diana Solis is a 60 y.o. female is here for spirometry; Alopecia; and Weight Gain   Subjective: HPI:  Follow-up COPD: Using Spiriva andbreo on a daily basis with 100% compliance. Despite this she gets shortness of breath with exertion and has a chronic daily productive cough. She denies any change in her baseline shortness of breath nor sputum production over the past 2 months. She denies fevers, chills or wheezing. No longer smoking. Denies night sweats. Denies chest discomfort.  She's noticed that she's having more urinary urgency when she is used to. When she urinates it's much less volume than she predicted would be based on the degree of her urgency. She denies any incontinence or dysuria. Nothing seems to make symptoms better or worse. She is waking up 2-3 times a night which is out of character, needing to urinate.   Review Of Systems Outlined In HPI  Past Medical History  Diagnosis Date  . Urinary incontinence 10/19/2011  . DIVERTICULOSIS, COLON 12/10/2010    Qualifier: Diagnosis of  By: Orson Aloe MD, Tinnie Gens    . GLAUCOMA 09/29/2009    Qualifier: Diagnosis of  By: Linford Arnold MD, Santina Evans      Past Surgical History  Procedure Laterality Date  . Tonsillectomy    . Vaginal hysterectomy    . Incontinence surgery    . Breast cyst excision     Family History  Problem Relation Age of Onset  . Asthma Mother   . Lung cancer Father     smoker  . Lupus Father   . Thyroid disease Mother     Social History   Social History  . Marital Status: Married    Spouse Name: Darrel.   . Number of Children: 1  . Years of Education: N/A   Occupational History  . Not on file.   Social History Main Topics  . Smoking status: Former Smoker -- 0.50 packs/day for 40 years    Types: Cigarettes  . Smokeless tobacco: Current User     Comment: e-cig  . Alcohol Use: 3.6 oz/week    6 Standard drinks or equivalent per week  . Drug Use: No  . Sexual Activity: Not on file   Other Topics  Concern  . Not on file   Social History Narrative   No regular exercise. 3- 4 caffeine drinks per day. 3 grandkids.      Objective: BP 135/80 mmHg  Pulse 63  Wt 172 lb (78.019 kg)  General: Alert and Oriented, No Acute Distress HEENT: Pupils equal, round, reactive to light. Conjunctivae clear.  Moist mucous membranes Lungs: Clear to auscultation bilaterally, no wheezing/ronchi/rales.  Comfortable work of breathing. Good air movement. Cardiac: Regular rate and rhythm. Normal S1/S2.  No murmurs, rubs, nor gallops.   Extremities: No peripheral edema.  Strong peripheral pulses.  Mental Status: No depression, anxiety, nor agitation. Skin: Warm and dry.  Assessment & Plan: Diana Solis was seen today for spirometry, alopecia and weight gain.  Diagnoses and all orders for this visit:  SOB (shortness of breath) -     Spirometry: Pre & Post Eval  Pulmonary emphysema, unspecified emphysema type (HCC) -     fluticasone furoate-vilanterol (BREO ELLIPTA) 200-25 MCG/INH AEPB; Inhale 1 puff into the lungs daily.  Urinary urgency   Spirometry today showing a irreversible obstructive and restrictive defect. Uncontrolled chronic condition. Increasing Breo, continue spiriva. Checking UA for possible UTI  Return in about 3 months (around 04/01/2016) for Breathing.

## 2016-01-03 NOTE — Addendum Note (Signed)
Addended by: Thom Chimes on: 01/03/2016 12:09 PM   Modules accepted: Orders

## 2016-01-03 NOTE — Addendum Note (Signed)
Addended by: Laren Boom on: 01/03/2016 12:54 PM   Modules accepted: Orders

## 2016-01-05 LAB — URINE CULTURE: Colony Count: 60000

## 2016-01-07 ENCOUNTER — Telehealth: Payer: Self-pay | Admitting: Family Medicine

## 2016-01-07 NOTE — Telephone Encounter (Signed)
Error

## 2016-01-10 ENCOUNTER — Ambulatory Visit (INDEPENDENT_AMBULATORY_CARE_PROVIDER_SITE_OTHER): Payer: BLUE CROSS/BLUE SHIELD | Admitting: Family Medicine

## 2016-01-10 VITALS — BP 131/44 | HR 61 | Ht 64.0 in | Wt 175.0 lb

## 2016-01-10 DIAGNOSIS — R3915 Urgency of urination: Secondary | ICD-10-CM | POA: Diagnosis not present

## 2016-01-10 DIAGNOSIS — R8290 Unspecified abnormal findings in urine: Secondary | ICD-10-CM | POA: Diagnosis not present

## 2016-01-10 LAB — POCT URINALYSIS DIPSTICK
Bilirubin, UA: NEGATIVE
Blood, UA: NEGATIVE
Glucose, UA: NEGATIVE
Ketones, UA: NEGATIVE
Nitrite, UA: NEGATIVE
Protein, UA: NEGATIVE
Spec Grav, UA: 1.01
Urobilinogen, UA: 0.2
pH, UA: 6.5

## 2016-01-10 MED ORDER — CEPHALEXIN 500 MG PO CAPS: 500.0000 mg | ORAL_CAPSULE | Freq: Three times a day (TID) | ORAL | Status: DC

## 2016-01-10 NOTE — Progress Notes (Signed)
Amber, Will you please let patient know that I'd recommend starting on an antibiotic called cephelexin while we wait for her urine culture to finalize later this week.  Rx sent to her [facility].

## 2016-01-10 NOTE — Progress Notes (Signed)
   Subjective:    Patient ID: Diana Solis, female    DOB: 03-Nov-1956, 60 y.o.   MRN: [id number] Pt notified to start abx.  Donne Anon, CMA HPI    Review of Systems     Objective:   Physical Exam        Assessment & Plan:

## 2016-01-10 NOTE — Progress Notes (Signed)
   Subjective:    Patient ID: Diana Solis, female    DOB: 24-May-1956, 60 y.o.   MRN: [id number] Pt in today to leave another urine sample checking for UTI.  Symptoms are urinary urgency as well as a slight urine odor. POC UA shows only small leukocytes.  Urine culture ordered.  Donne Anon, CMA HPI    Review of Systems     Objective:   Physical Exam        Assessment & Plan:

## 2016-01-12 LAB — URINE CULTURE
Colony Count: NO GROWTH
Organism ID, Bacteria: NO GROWTH

## 2016-01-28 ENCOUNTER — Telehealth: Payer: Self-pay

## 2016-01-28 NOTE — Telephone Encounter (Signed)
Although pt is taking BREO, is it ok for her to take fluticasone as well?

## 2016-01-28 NOTE — Telephone Encounter (Signed)
Pt.notified

## 2016-01-28 NOTE — Telephone Encounter (Signed)
Yes if it's the nasal spray but no if it's an additional inhaler.

## 2016-02-09 ENCOUNTER — Ambulatory Visit (INDEPENDENT_AMBULATORY_CARE_PROVIDER_SITE_OTHER): Payer: BLUE CROSS/BLUE SHIELD | Admitting: Family Medicine

## 2016-02-09 ENCOUNTER — Encounter: Payer: Self-pay | Admitting: Family Medicine

## 2016-02-09 VITALS — BP 128/73 | HR 78 | Temp 99.1°F | Wt 174.0 lb

## 2016-02-09 DIAGNOSIS — M791 Myalgia, unspecified site: Secondary | ICD-10-CM

## 2016-02-09 DIAGNOSIS — J439 Emphysema, unspecified: Secondary | ICD-10-CM | POA: Diagnosis not present

## 2016-02-09 DIAGNOSIS — R509 Fever, unspecified: Secondary | ICD-10-CM | POA: Diagnosis not present

## 2016-02-09 DIAGNOSIS — R0981 Nasal congestion: Secondary | ICD-10-CM | POA: Diagnosis not present

## 2016-02-09 LAB — POC INFLUENZA A&B (BINAX/QUICKVUE)
Influenza A, POC: NEGATIVE
Influenza B, POC: NEGATIVE

## 2016-02-09 MED ORDER — LEVOFLOXACIN 500 MG PO TABS: 500.0000 mg | ORAL_TABLET | Freq: Every day | ORAL | Status: DC

## 2016-02-09 MED ORDER — ALBUTEROL SULFATE HFA 108 (90 BASE) MCG/ACT IN AERS: 2.0000 | INHALATION_SPRAY | Freq: Four times a day (QID) | RESPIRATORY_TRACT | Status: DC | PRN

## 2016-02-09 NOTE — Progress Notes (Signed)
CC: Diana Solis is a 60 y.o. female is here for URI   Subjective: HPI:  Yesterday she experienced sudden onset of nasal congestion, nonproductive cough, myalgias and fatigue. It's been persistent and slowly worsening since onset. No benefit from Mucinex, antihistamine or pseudoephedrine. No other interventions as of yet. She denies shortness of breath or wheezing. She denies rash or photophobia. There is a pressure sensation in her maxillary sinuses that feels like "they want to explode" but she denies any other headache.   Review Of Systems Outlined In HPI  Past Medical History  Diagnosis Date  . Urinary incontinence 10/19/2011  . DIVERTICULOSIS, COLON 12/10/2010    Qualifier: Diagnosis of  By: Orson AloeHenderson MD, Tinnie GensJeffrey    . GLAUCOMA 09/29/2009    Qualifier: Diagnosis of  By: Linford ArnoldMetheney MD, Santina Evansatherine      Past Surgical History  Procedure Laterality Date  . Tonsillectomy    . Vaginal hysterectomy    . Incontinence surgery    . Breast cyst excision     Family History  Problem Relation Age of Onset  . Asthma Mother   . Lung cancer Father     smoker  . Lupus Father   . Thyroid disease Mother     Social History   Social History  . Marital Status: Married    Spouse Name: Darrel.   . Number of Children: 1  . Years of Education: N/A   Occupational History  . Not on file.   Social History Main Topics  . Smoking status: Former Smoker -- 0.50 packs/day for 40 years    Types: Cigarettes  . Smokeless tobacco: Current User     Comment: e-cig  . Alcohol Use: 3.6 oz/week    6 Standard drinks or equivalent per week  . Drug Use: No  . Sexual Activity: Not on file   Other Topics Concern  . Not on file   Social History Narrative   No regular exercise. 3- 4 caffeine drinks per day. 3 grandkids.      Objective: BP 128/73 mmHg  Pulse 78  Temp(Src) 99.1 F (37.3 C) (Oral)  Wt 174 lb (78.926 kg)  SpO2 93%  General: Alert and Oriented, No Acute Distress HEENT: Pupils equal,  round, reactive to light. Conjunctivae clear.  External ears unremarkable, canals clear with intact TMs with appropriate landmarks.  Middle ear appears open without effusion. Pink inferior turbinates.  Moist mucous membranes, pharynx without inflammation nor lesions.  Neck supple without palpable lymphadenopathy nor abnormal masses. Lungs: Clear to auscultation bilaterally, no wheezing/ronchi/rales.  Comfortable work of breathing. Good air movement. Extremities: No peripheral edema.  Strong peripheral pulses.  Mental Status: No depression, anxiety, nor agitation. Skin: Warm and dry.  Assessment & Plan: Diana Solis was seen today for uri.  Diagnoses and all orders for this visit:  Myalgia -     levofloxacin (LEVAQUIN) 500 MG tablet; Take 1 tablet (500 mg total) by mouth daily.  Nasal congestion -     levofloxacin (LEVAQUIN) 500 MG tablet; Take 1 tablet (500 mg total) by mouth daily.  Pulmonary emphysema, unspecified emphysema type (HCC) -     albuterol (PROAIR HFA) 108 (90 Base) MCG/ACT inhaler; Inhale 2 puffs into the lungs every 6 (six) hours as needed for wheezing or shortness of breath.   Flu test negative, suspect bacterial maxillary sinusititis therefore start levaquin and afrin. Signs and symptoms requring emergent/urgent reevaluation were discussed with the patient.  Return if symptoms worsen or fail to improve.

## 2016-02-09 NOTE — Addendum Note (Signed)
Addended by: Thom ChimesHENRY, Christabell Loseke M on: 02/09/2016 04:36 PM   Modules accepted: Orders, SmartSet

## 2016-03-27 ENCOUNTER — Other Ambulatory Visit: Payer: Self-pay

## 2016-05-21 ENCOUNTER — Other Ambulatory Visit: Payer: Self-pay | Admitting: Family Medicine

## 2016-06-13 ENCOUNTER — Other Ambulatory Visit: Payer: Self-pay | Admitting: Family Medicine

## 2016-06-16 ENCOUNTER — Encounter: Payer: Self-pay | Admitting: Family Medicine

## 2016-06-16 ENCOUNTER — Ambulatory Visit (INDEPENDENT_AMBULATORY_CARE_PROVIDER_SITE_OTHER): Payer: BLUE CROSS/BLUE SHIELD | Admitting: Family Medicine

## 2016-06-16 VITALS — BP 135/78 | HR 79 | Wt 171.0 lb

## 2016-06-16 DIAGNOSIS — Z1211 Encounter for screening for malignant neoplasm of colon: Secondary | ICD-10-CM

## 2016-06-16 DIAGNOSIS — E663 Overweight: Secondary | ICD-10-CM | POA: Diagnosis not present

## 2016-06-16 DIAGNOSIS — E039 Hypothyroidism, unspecified: Secondary | ICD-10-CM

## 2016-06-16 DIAGNOSIS — N951 Menopausal and female climacteric states: Secondary | ICD-10-CM

## 2016-06-16 DIAGNOSIS — R232 Flushing: Secondary | ICD-10-CM

## 2016-06-16 LAB — TSH: TSH: 4.16 mIU/L

## 2016-06-16 MED ORDER — PHENTERMINE HCL 37.5 MG PO TABS: 37.5000 mg | ORAL_TABLET | Freq: Every day | ORAL | 0 refills | Status: DC

## 2016-06-16 NOTE — Progress Notes (Signed)
CC: Diana Solis is a 60 y.o. female is here for Hypothyroidism (need lab work); Hot Flashes; and Referral (to Gastro)   Subjective: HPI:  Follow-up hypothyroidism: She is taking levothyroxine on a daily basis. She is having difficulty losing weight despite portion reduction. No physical activity plan at the present time. Denies any skin or hair changes. Denies any constipation or diarrhea.  She's been battling hot flashes for 10 years now. She tells me on a daily basis she'll get a sudden rush of a flushing sensation in her face with breaking out in a sweat. Symptoms are now interfering with her quality of life. Estrogen was not beneficial in the past.   She was a medication that she can take to help   loose weight. She's never tried anything before in the past.  Review Of Systems Outlined In HPI  Past Medical History:  Diagnosis Date  . DIVERTICULOSIS, COLON 12/10/2010   Qualifier: Diagnosis of  By: Orson Aloe MD, Tinnie Gens    . GLAUCOMA 09/29/2009   Qualifier: Diagnosis of  By: Linford Arnold MD, Santina Evans    . Urinary incontinence 10/19/2011    Past Surgical History:  Procedure Laterality Date  . BREAST CYST EXCISION    . INCONTINENCE SURGERY    . TONSILLECTOMY    . VAGINAL HYSTERECTOMY     Family History  Problem Relation Age of Onset  . Asthma Mother   . Lung cancer Father     smoker  . Lupus Father   . Thyroid disease Mother     Social History   Social History  . Marital status: Married    Spouse name: Darrel.   . Number of children: 1  . Years of education: N/A   Occupational History  . Not on file.   Social History Main Topics  . Smoking status: Former Smoker    Packs/day: 0.50    Years: 40.00    Types: Cigarettes  . Smokeless tobacco: Current User     Comment: e-cig  . Alcohol use 3.6 oz/week    6 Standard drinks or equivalent per week  . Drug use: No  . Sexual activity: Not on file   Other Topics Concern  . Not on file   Social History Narrative   No  regular exercise. 3- 4 caffeine drinks per day. 3 grandkids.      Objective: BP 135/78   Pulse 79   Wt 171 lb (77.6 kg)   BMI 29.35 kg/m   General: Alert and Oriented, No Acute Distress HEENT: Pupils equal, round, reactive to light. Conjunctivae clear.  Moist mucous membranes Lungs: Clear to auscultation bilaterally, no wheezing/ronchi/rales.  Comfortable work of breathing. Good air movement. Cardiac: Regular rate and rhythm. Normal S1/S2.  No murmurs, rubs, nor gallops.   Extremities: No peripheral edema.  Strong peripheral pulses.  Mental Status: No depression, anxiety, nor agitation. Skin: Warm and dry.  Assessment & Plan: Elisandra was seen today for hypothyroidism, hot flashes and referral.  Diagnoses and all orders for this visit:  Hypothyroidism, unspecified hypothyroidism type -     TSH  Hot flashes  Special screening for malignant neoplasms, colon -     Ambulatory referral to Gastroenterology  Overweight  Other orders -     phentermine (ADIPEX-P) 37.5 MG tablet; Take 1 tablet (37.5 mg total) by mouth daily before breakfast.   Hypothyroidism: Clinically controlled but due for TSH and refills of levothyroxine once the results are back Hot flashes: Discussed use of SSRI or SNRI reduce  the frequency and severity of her hot flashes, she doesn't like the idea about taking medication from these classes. She is open to the idea of trying black cohosh Overweight: She'll start on phentermine to help reduce weight, we discussed that this is only a temporary fix as she'll go up a tolerance to it in the next few months. She is overdue for colonoscopy referral has been placed  Return in about 6 months (around 12/17/2016).

## 2016-06-19 ENCOUNTER — Telehealth: Payer: Self-pay | Admitting: Family Medicine

## 2016-06-19 MED ORDER — LEVOTHYROXINE SODIUM 88 MCG PO TABS: 88.0000 ug | ORAL_TABLET | Freq: Every day | ORAL | 1 refills | Status: DC

## 2016-06-19 NOTE — Telephone Encounter (Signed)
Pt.notified

## 2016-06-19 NOTE — Telephone Encounter (Signed)
Will you please let patient know that her thyroid supplement appears to be adequately dosed so I will send a refill into [facility]

## 2016-07-20 ENCOUNTER — Other Ambulatory Visit: Payer: Self-pay | Admitting: Family Medicine

## 2016-07-24 ENCOUNTER — Encounter: Payer: Self-pay | Admitting: Family Medicine

## 2016-07-24 ENCOUNTER — Ambulatory Visit (INDEPENDENT_AMBULATORY_CARE_PROVIDER_SITE_OTHER): Payer: BLUE CROSS/BLUE SHIELD | Admitting: Family Medicine

## 2016-07-24 VITALS — BP 133/54 | HR 70 | Wt 171.0 lb

## 2016-07-24 DIAGNOSIS — Z23 Encounter for immunization: Secondary | ICD-10-CM | POA: Diagnosis not present

## 2016-07-24 DIAGNOSIS — Z780 Asymptomatic menopausal state: Secondary | ICD-10-CM

## 2016-07-24 DIAGNOSIS — R9431 Abnormal electrocardiogram [ECG] [EKG]: Secondary | ICD-10-CM | POA: Diagnosis not present

## 2016-07-24 DIAGNOSIS — B0089 Other herpesviral infection: Secondary | ICD-10-CM

## 2016-07-24 DIAGNOSIS — Z1159 Encounter for screening for other viral diseases: Secondary | ICD-10-CM

## 2016-07-24 DIAGNOSIS — E039 Hypothyroidism, unspecified: Secondary | ICD-10-CM

## 2016-07-24 DIAGNOSIS — R21 Rash and other nonspecific skin eruption: Secondary | ICD-10-CM

## 2016-07-24 DIAGNOSIS — J439 Emphysema, unspecified: Secondary | ICD-10-CM | POA: Diagnosis not present

## 2016-07-24 HISTORY — DX: Other herpesviral infection: B00.89

## 2016-07-24 MED ORDER — TRIAMCINOLONE ACETONIDE 0.1 % EX CREA: 1.0000 "application " | TOPICAL_CREAM | Freq: Two times a day (BID) | CUTANEOUS | 1 refills | Status: DC

## 2016-07-24 NOTE — Addendum Note (Signed)
Addended by: Minna AntisBRIGHAM, EBONY T on: 07/24/2016 12:03 PM   Modules accepted: Orders

## 2016-07-24 NOTE — Progress Notes (Signed)
Diana Solis is a 60 y.o. female who [facility] today for risk factor stratification for colonoscopy and rash on face.  Patient is scheduled for colonoscopy and endoscopy however the gastroenterologist recommends surgical clearance first. She notes that he has sometimes she does have palpitations and she frequently has shortness of breath that she attributes to her COPD. She denies any chest pain fevers or chills. She notes that she's had a stress test about 15 years ago.  She does note exertional intolerance with shortness of breath but attributes these symptoms to her COPD.  Additionally she notes a rash on her face. She attributes this to oral herpes. She has a patch on her cheek to becomes erythematous and irritated occasionally. She often uses over-the-counter Abreva which helps some. She does note a family history for lupus and is concerned this may be lupus instead.   Past Medical History:  Diagnosis Date  . DIVERTICULOSIS, COLON 12/10/2010   Qualifier: Diagnosis of  By: Orson AloeHenderson MD, Tinnie GensJeffrey    . GLAUCOMA 09/29/2009   Qualifier: Diagnosis of  By: Linford ArnoldMetheney MD, Santina Evansatherine    . Urinary incontinence 10/19/2011   Past Surgical History:  Procedure Laterality Date  . BREAST CYST EXCISION    . INCONTINENCE SURGERY    . TONSILLECTOMY    . VAGINAL HYSTERECTOMY     Social History  Substance Use Topics  . Smoking status: Former Smoker    Packs/day: 0.50    Years: 40.00    Types: Cigarettes  . Smokeless tobacco: Current User     Comment: e-cig  . Alcohol use 3.6 oz/week    6 Standard drinks or equivalent per week   family history includes Asthma in her mother; Lung cancer in her father; Lupus in her father; Thyroid disease in her mother.  ROS as above:  Medications: Current Outpatient Prescriptions  Medication Sig Dispense Refill  . albuterol  (PROAIR HFA) 108 (90 Base) MCG/ACT inhaler Inhale 2 puffs into the lungs every 6 (six) hours as needed for wheezing or shortness of breath. 18 g 1  . AMBULATORY NON FORMULARY MEDICATION Please discontinue all oxygen services for this patient 1 each 0  . cetirizine (ZYRTEC) 5 MG tablet Take 5 mg by mouth daily.      . fluticasone furoate-vilanterol (BREO ELLIPTA) 200-25 MCG/INH AEPB Inhale 1 puff into the lungs daily. 60 each 11  . levothyroxine (SYNTHROID, LEVOTHROID) 88 MCG tablet Take 1 tablet (88 mcg total) by mouth daily. 90 tablet 1  . LUMIGAN 0.01 % SOLN   0  . SPIRIVA RESPIMAT 1.25 MCG/ACT AERS inhale 2 puffs by mouth daily 4 Inhaler 1  . fluticasone (FLONASE) 50 MCG/ACT nasal spray Two sprays each nostril daily. 16 g 3  . triamcinolone cream (KENALOG) 0.1 % Apply 1 application topically 2 (two) times daily. 60 g 1   No current facility-administered medications for this visit.    No Known Allergies   Exam:  BP (!) 133/54   Pulse 70   Wt 171 lb (77.6 kg)   SpO2 96%   BMI 29.35 kg/m  Gen: Well NAD HEENT: EOMI,  MMM Lungs: Normal work of breathing. CTABL Heart: RRR no MRG Abd: NABS, Soft. Nondistended, Nontender Exts: Brisk capillary refill, warm and well perfused.  Skin: Erythematous macular patch left cheek. No vesicles noted.  12-lead EKG shows sinus bradycardia at 56 bpm. Flattened T waves. No Q waves. Otherwise unremarkable.  No  results found for this or any previous visit (from the past 24 hour(s)). No results found.    Assessment and Plan: 60 y.o. female with   Palpitations and cardiac clearance.  Based on patient's history and lack of recent fasting labs I do not recommend proceeding with colonoscopy at this time. Patient will likely benefit from stress test in the near future given her exertional intolerance. Plan to refer to cardiology for further risk factor stratification.  Additionally patient has a skin lesion on her face. I suspect this is probably  seborrheic dermatitis however this is a possibility. Obtain rheumatologic workup and treat empirically with low-dose triamcinolone cream. Follow-up in one month.  Influenza and zoster vaccines given prior to discharge.   Orders Placed This Encounter  Procedures  . CBC  . Comprehensive metabolic panel    Order Specific Question:   Has the patient fasted?    Answer:   No  . Lipid panel    Order Specific Question:   Has the patient fasted?    Answer:   No  . Hemoglobin A1c  . VITAMIN D 25 Hydroxy (Vit-D Deficiency, Fractures)  . Cyclic citrul peptide antibody, IgG  . Sedimentation rate  . Rheumatoid factor  . ANA  . Anti-DNA antibody, double-stranded  . Hepatitis C antibody  . Ambulatory referral to Cardiology    Referral Priority:   Routine    Referral Type:   Consultation    Referral Reason:   Specialty Services Required    Requested Specialty:   Cardiology    Number of Visits Requested:   1  . EKG 12-Lead    Discussed warning signs or symptoms. Please see discharge instructions. Patient expresses understanding.

## 2016-07-24 NOTE — Patient Instructions (Addendum)
Thank you for coming in today. Get fasting labs soon.  You should hear from cardiology soon.  Return in 1 month.  I will let [facility] know soon.   Try using the cream on your face sparingly.     Call or go to the emergency room if you get worse, have trouble breathing, have chest pains, or palpitations.

## 2016-07-26 LAB — COMPREHENSIVE METABOLIC PANEL
ALT: 16 U/L (ref 6–29)
AST: 18 U/L (ref 10–35)
Albumin: 4.5 g/dL (ref 3.6–5.1)
Alkaline Phosphatase: 90 U/L (ref 33–130)
BUN: 11 mg/dL (ref 7–25)
CO2: 25 mmol/L (ref 20–31)
Calcium: 9.4 mg/dL (ref 8.6–10.4)
Chloride: 103 mmol/L (ref 98–110)
Creat: 0.66 mg/dL (ref 0.50–0.99)
Glucose, Bld: 95 mg/dL (ref 65–99)
Potassium: 4.3 mmol/L (ref 3.5–5.3)
Sodium: 136 mmol/L (ref 135–146)
Total Bilirubin: 0.4 mg/dL (ref 0.2–1.2)
Total Protein: 7.3 g/dL (ref 6.1–8.1)

## 2016-07-26 LAB — LIPID PANEL
Cholesterol: 225 mg/dL — ABNORMAL HIGH (ref 125–200)
HDL: 77 mg/dL (ref >=46)
LDL Cholesterol: 128 mg/dL (ref <130)
Total CHOL/HDL Ratio: 2.9 Ratio (ref <=5.0)
Triglycerides: 99 mg/dL (ref <150)
VLDL: 20 mg/dL (ref <30)

## 2016-07-26 LAB — CBC
HCT: 41.2 % (ref 35.0–45.0)
Hemoglobin: 14.2 g/dL (ref 11.7–15.5)
MCH: 30.3 pg (ref 27.0–33.0)
MCHC: 34.5 g/dL (ref 32.0–36.0)
MCV: 88 fL (ref 80.0–100.0)
MPV: 9.6 fL (ref 7.5–12.5)
Platelets: 245 10*3/uL (ref 140–400)
RBC: 4.68 MIL/uL (ref 3.80–5.10)
RDW: 13.8 % (ref 11.0–15.0)
WBC: 4.5 10*3/uL (ref 3.8–10.8)

## 2016-07-27 ENCOUNTER — Encounter: Payer: Self-pay | Admitting: Family Medicine

## 2016-07-27 DIAGNOSIS — R7303 Prediabetes: Secondary | ICD-10-CM | POA: Insufficient documentation

## 2016-07-27 LAB — HEMOGLOBIN A1C
Hgb A1c MFr Bld: 5.7 % — ABNORMAL HIGH (ref <5.7)
Mean Plasma Glucose: 117 mg/dL

## 2016-07-27 LAB — SEDIMENTATION RATE: Sed Rate: 6 mm/hr (ref 0–30)

## 2016-07-27 LAB — CYCLIC CITRUL PEPTIDE ANTIBODY, IGG: Cyclic Citrullin Peptide Ab: <16 Units

## 2016-07-27 LAB — ANTI-DNA ANTIBODY, DOUBLE-STRANDED: ds DNA Ab: <1 IU/mL

## 2016-07-27 LAB — RHEUMATOID FACTOR: Rhuematoid fact SerPl-aCnc: <14 IU/mL (ref <14)

## 2016-07-27 LAB — ANA: Anti Nuclear Antibody(ANA): NEGATIVE

## 2016-07-27 LAB — VITAMIN D 25 HYDROXY (VIT D DEFICIENCY, FRACTURES): Vit D, 25-Hydroxy: 35 ng/mL (ref 30–100)

## 2016-07-27 LAB — HEPATITIS C ANTIBODY: HCV Ab: NEGATIVE

## 2016-08-21 ENCOUNTER — Ambulatory Visit: Payer: BLUE CROSS/BLUE SHIELD | Admitting: Family Medicine

## 2016-08-22 NOTE — Progress Notes (Signed)
Referring: Clementeen Graham MD  HPI: 60 yo female for evaluation of palpitations and dyspnea. Nuclear study 2008 with EF 58 and normal perfusion. Pt with h/o COPD. Patient has dyspnea with moderate activities but not routine activities. No orthopnea, PND or pedal edema. Occasional heart skips but no sustained palpitations. She has occasional pain in her left chest that is not exertional. It does not radiate. Typically occurs later in the evening. She does not have exertional chest pain. Because of the above we are asked to evaluate.  Current Outpatient Prescriptions  Medication Sig Dispense Refill  . albuterol (PROAIR HFA) 108 (90 Base) MCG/ACT inhaler Inhale 2 puffs into the lungs every 6 (six) hours as needed for wheezing or shortness of breath. 18 g 1  . CALCIUM-VITAMIN D PO Take 600 mg by mouth daily.    . cetirizine (ZYRTEC) 5 MG tablet Take 5 mg by mouth daily.      . Esomeprazole Magnesium (NEXIUM PO) Take by mouth daily.    Marland Kitchen FIBER PO Take 1,500 mg by mouth daily.    . fluticasone (FLONASE) 50 MCG/ACT nasal spray Two sprays each nostril daily. 16 g 3  . fluticasone furoate-vilanterol (BREO ELLIPTA) 200-25 MCG/INH AEPB Inhale 1 puff into the lungs daily. 60 each 11  . levothyroxine (SYNTHROID, LEVOTHROID) 88 MCG tablet Take 1 tablet (88 mcg total) by mouth daily. 90 tablet 1  . LUMIGAN 0.01 % SOLN Place 1 drop into both eyes at bedtime.   0  . Multiple Vitamin (MULTIVITAMIN) tablet Take 1 tablet by mouth daily.    Marland Kitchen SPIRIVA RESPIMAT 1.25 MCG/ACT AERS inhale 2 puffs by mouth daily 4 Inhaler 1  . triamcinolone cream (KENALOG) 0.1 % Apply 1 application topically 2 (two) times daily. 60 g 1  . vitamin C (ASCORBIC ACID) 500 MG tablet Take 500 mg by mouth daily.     No current facility-administered medications for this visit.     No Known Allergies   Past Medical History:  Diagnosis Date  . COPD (chronic obstructive pulmonary disease) (HCC)   . DIVERTICULOSIS, COLON 12/10/2010   Qualifier: Diagnosis of  By: Orson Aloe MD, Tinnie Gens    . GERD (gastroesophageal reflux disease)   . GLAUCOMA 09/29/2009   Qualifier: Diagnosis of  By: Linford Arnold MD, Santina Evans    . Hypothyroid   . Urinary incontinence 10/19/2011    Past Surgical History:  Procedure Laterality Date  . BREAST CYST EXCISION    . INCONTINENCE SURGERY    . TONSILLECTOMY    . VAGINAL HYSTERECTOMY      Social History   Social History  . Marital status: Married    Spouse name: Darrel  . Number of children: 1  . Years of education: N/A   Occupational History  . Not on file.   Social History Main Topics  . Smoking status: Former Smoker    Packs/day: 0.50    Years: 40.00    Types: Cigarettes  . Smokeless tobacco: Current User     Comment: e-cig  . Alcohol use 3.6 oz/week    6 Standard drinks or equivalent per week  . Drug use: No  . Sexual activity: Not on file   Other Topics Concern  . Not on file   Social History Narrative   No regular exercise. 3- 4 caffeine drinks per day. 3 grandkids.     Family History  Problem Relation Age of Onset  . Asthma Mother   . Thyroid disease Mother   . Lung cancer  Father     smoker  . Lupus Father     ROS: no fevers or chills, productive cough, hemoptysis, dysphasia, odynophagia, melena, hematochezia, dysuria, hematuria, rash, seizure activity, orthopnea, PND, pedal edema, claudication. Remaining systems are negative.  Physical Exam:   Blood pressure 113/61, pulse 70, height 5\' 4"  (1.626 m), weight 171 lb 6.4 oz (77.7 kg).  General:  Well developed/well nourished in NAD Skin warm/dry Patient not depressed No peripheral clubbing Back-normal HEENT-normal/normal eyelids Neck supple/normal carotid upstroke bilaterally; no bruits; no JVD; no thyromegaly chest - CTA/ normal expansion CV - RRR/normal S1 and S2; no murmurs, rubs or gallops;  PMI nondisplaced Abdomen -NT/ND, no HSM, no mass, + bowel sounds, no bruit 2+ femoral pulses, no bruits Ext-no  edema, chords, 2+ DP Neuro-grossly nonfocal  ECG-07/24/16-sinus rhythm with nonspecific ST changes.  A/P  1  Palpitations-Likely PACs or PVCs. Does not seem to be limiting at this point. We will consider a monitor in the future if they worsen.  2 Dyspnea-likely contribution from deconditioning and COPD. Plan nuclear study for risk stratification.  3 chest pain-symptoms atypical. Plan nuclear study for risk stratification.  Olga MillersBrian Crenshaw, MD

## 2016-08-23 ENCOUNTER — Ambulatory Visit (INDEPENDENT_AMBULATORY_CARE_PROVIDER_SITE_OTHER): Payer: BLUE CROSS/BLUE SHIELD | Admitting: Cardiology

## 2016-08-23 ENCOUNTER — Encounter: Payer: Self-pay | Admitting: Cardiology

## 2016-08-23 VITALS — BP 113/61 | HR 70 | Ht 64.0 in | Wt 171.4 lb

## 2016-08-23 DIAGNOSIS — R0609 Other forms of dyspnea: Secondary | ICD-10-CM | POA: Diagnosis not present

## 2016-08-23 DIAGNOSIS — R002 Palpitations: Secondary | ICD-10-CM

## 2016-08-23 DIAGNOSIS — R0600 Dyspnea, unspecified: Secondary | ICD-10-CM

## 2016-08-23 DIAGNOSIS — R072 Precordial pain: Secondary | ICD-10-CM | POA: Diagnosis not present

## 2016-08-23 DIAGNOSIS — R9431 Abnormal electrocardiogram [ECG] [EKG]: Secondary | ICD-10-CM

## 2016-08-23 NOTE — Patient Instructions (Signed)
Medication Instructions:   NO CHANGE  Testing/Procedures:  Your physician has requested that you have a lexiscan myoview. For further information please visit [email or web address]. Please follow instruction sheet, as given.    Follow-Up:  Your physician recommends that you schedule a follow-up appointment in: AS NEEDED PENDING TEST RESULTS      

## 2016-08-24 ENCOUNTER — Encounter (HOSPITAL_COMMUNITY): Payer: BLUE CROSS/BLUE SHIELD

## 2016-08-28 ENCOUNTER — Encounter: Payer: Self-pay | Admitting: Sports Medicine

## 2016-08-28 ENCOUNTER — Ambulatory Visit (INDEPENDENT_AMBULATORY_CARE_PROVIDER_SITE_OTHER): Payer: BLUE CROSS/BLUE SHIELD | Admitting: Sports Medicine

## 2016-08-28 DIAGNOSIS — K439 Ventral hernia without obstruction or gangrene: Secondary | ICD-10-CM | POA: Diagnosis not present

## 2016-08-28 NOTE — Assessment & Plan Note (Signed)
No evidence of obstruction, strangulation or incarceration. Referral to general surgery.

## 2016-08-28 NOTE — Progress Notes (Signed)
  Subjective:    CC: "knot in stomach"   HPI: 60 yo F presenting for evaluation of stomach bulge.  Patient says she was in her normal state of health until three weeks ago when she noticed a bulging mass above her belly button. Patient says this bulge does not hurt or cause her any symptoms.  It has not changed in size. She says bulge goes away when lying down.  No history of umbilical hernias or abdominal surgery.   Past medical history:  Negative.  See flowsheet/record as well for more information.  Surgical history: Negative.  See flowsheet/record as well for more information.  Family history: Negative.  See flowsheet/record as well for more information.  Social history: Negative.  See flowsheet/record as well for more information.  Allergies, and medications have been entered into the medical record, reviewed, and no changes needed.   Review of Systems: No fevers, chills, night sweats, weight loss, chest pain, or shortness of breath.   Objective:    General: Well Developed, well nourished, and in no acute distress.  Neuro: Alert and oriented x3, extra-ocular muscles intact, sensation grossly intact.  HEENT: Normocephalic, atraumatic, pupils equal round reactive to light, neck supple, no masses, no lymphadenopathy, thyroid nonpalpable.  Skin: Warm and dry, no rashes. Cardiac: Regular rate and rhythm, no murmurs rubs or gallops, no lower extremity edema.  Respiratory: Clear to auscultation bilaterally. Not using accessory muscles, speaking in full sentences. Abdomen: Soft, obese.  2cm ventral hernia appreciated with palpation.  Reducible when lying down.  No evidence of strangulation or incarceration.    Impression and Recommendations:    1. Ventral hernia -Will refer to general surgeon for consult regarding mesh repair

## 2016-09-05 ENCOUNTER — Telehealth (HOSPITAL_COMMUNITY): Payer: Self-pay

## 2016-09-05 NOTE — Telephone Encounter (Signed)
Encounter complete. 

## 2016-09-06 ENCOUNTER — Inpatient Hospital Stay (HOSPITAL_COMMUNITY): Admission: RE | Admit: 2016-09-06 | Payer: BLUE CROSS/BLUE SHIELD | Source: Ambulatory Visit

## 2016-09-06 ENCOUNTER — Encounter (HOSPITAL_COMMUNITY): Payer: BLUE CROSS/BLUE SHIELD

## 2016-09-07 ENCOUNTER — Ambulatory Visit (HOSPITAL_COMMUNITY)
Admission: RE | Admit: 2016-09-07 | Discharge: 2016-09-07 | Disposition: A | Discharge status: Home / Self Care | Payer: BLUE CROSS/BLUE SHIELD | Source: Ambulatory Visit | Attending: Cardiovascular Disease | Admitting: Cardiovascular Disease

## 2016-09-07 DIAGNOSIS — J449 Chronic obstructive pulmonary disease, unspecified: Secondary | ICD-10-CM | POA: Diagnosis not present

## 2016-09-07 DIAGNOSIS — R42 Dizziness and giddiness: Secondary | ICD-10-CM | POA: Insufficient documentation

## 2016-09-07 DIAGNOSIS — R079 Chest pain, unspecified: Secondary | ICD-10-CM | POA: Diagnosis not present

## 2016-09-07 DIAGNOSIS — E039 Hypothyroidism, unspecified: Secondary | ICD-10-CM | POA: Insufficient documentation

## 2016-09-07 DIAGNOSIS — Z6829 Body mass index (BMI) 29.0-29.9, adult: Secondary | ICD-10-CM | POA: Insufficient documentation

## 2016-09-07 DIAGNOSIS — E663 Overweight: Secondary | ICD-10-CM | POA: Diagnosis not present

## 2016-09-07 DIAGNOSIS — Z87891 Personal history of nicotine dependence: Secondary | ICD-10-CM | POA: Insufficient documentation

## 2016-09-07 DIAGNOSIS — R5383 Other fatigue: Secondary | ICD-10-CM | POA: Diagnosis not present

## 2016-09-07 DIAGNOSIS — R9431 Abnormal electrocardiogram [ECG] [EKG]: Secondary | ICD-10-CM | POA: Insufficient documentation

## 2016-09-07 DIAGNOSIS — R0609 Other forms of dyspnea: Secondary | ICD-10-CM | POA: Diagnosis not present

## 2016-09-07 LAB — MYOCARDIAL PERFUSION IMAGING
LV dias vol: 62 mL (ref 46–106)
LV sys vol: 14 mL
Peak HR: 102 {beats}/min
Rest HR: 61 {beats}/min
SDS: 1
SRS: 5
SSS: 6
TID: 0.9

## 2016-09-07 IMAGING — NM NM MISC PROCEDURE
6 series · 36 of 36 positions shown · non-contrast
Comparison: none

[Series 1: wbr rest · 6.40mm/px · 6 of 64 frames shown]
[frame 6/64]
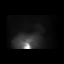
[frame 16/64]
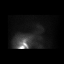
[frame 27/64]
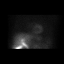
[frame 38/64]
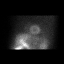
[frame 48/64]
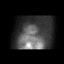
[frame 59/64]
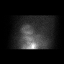

[Series 1: wbr_r-proj_st wbr rest · 6.40mm/px · 6 of 64 frames shown]
[frame 6/64]
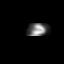
[frame 16/64]
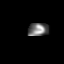
[frame 27/64]
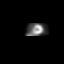
[frame 38/64]
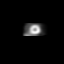
[frame 48/64]
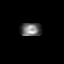
[frame 59/64]
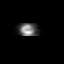

[Series 2: wbr_s-proj_st wbr stress-gsp · 6.40mm/px · 6 of 512 frames shown]
[frame 43/512]
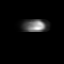
[frame 128/512]
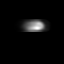
[frame 214/512]
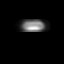
[frame 299/512]
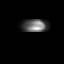
[frame 384/512]
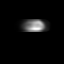
[frame 470/512]
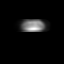

[Series 2: wbr stress-gsp · 6.40mm/px · 6 of 512 frames shown]
[frame 43/512]
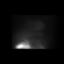
[frame 128/512]
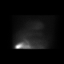
[frame 214/512]
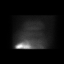
[frame 299/512]
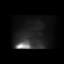
[frame 384/512]
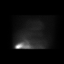
[frame 470/512]
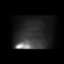

[Series 3: wbr_s-proj_st wbr stress-sum-em · 6.40mm/px · 6 of 64 frames shown]
[frame 6/64]
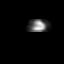
[frame 16/64]
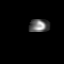
[frame 27/64]
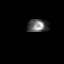
[frame 38/64]
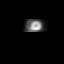
[frame 48/64]
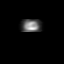
[frame 59/64]
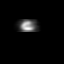

[Series 3: wbr stress-sum-em · 6.40mm/px · 6 of 64 frames shown]
[frame 6/64]
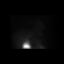
[frame 16/64]
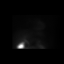
[frame 27/64]
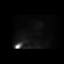
[frame 38/64]
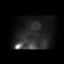
[frame 48/64]
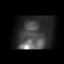
[frame 59/64]
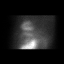

[36 of 36 positions shown; findings below may reference images not displayed]

Canned report from images found in remote index.

Refer to host system for actual result text.

## 2016-09-07 MED ORDER — REGADENOSON 0.4 MG/5ML IV SOLN
0.4000 mg | Freq: Once | INTRAVENOUS | Status: AC
  Administered 2016-09-07: 0.4 mg via INTRAVENOUS

## 2016-09-07 MED ORDER — TECHNETIUM TC 99M TETROFOSMIN IV KIT
10.7000 | PACK | Freq: Once | INTRAVENOUS | Status: AC | PRN
  Administered 2016-09-07: 10.7 via INTRAVENOUS
  Filled 2016-09-07: qty 11

## 2016-09-07 MED ORDER — TECHNETIUM TC 99M TETROFOSMIN IV KIT
31.8000 | PACK | Freq: Once | INTRAVENOUS | Status: AC | PRN
  Administered 2016-09-07: 31.8 via INTRAVENOUS
  Filled 2016-09-07: qty 32

## 2016-09-18 ENCOUNTER — Other Ambulatory Visit: Payer: Self-pay | Admitting: Family Medicine

## 2016-11-20 ENCOUNTER — Other Ambulatory Visit: Payer: Self-pay | Admitting: Family Medicine

## 2016-12-27 ENCOUNTER — Other Ambulatory Visit: Payer: Self-pay | Admitting: *Deleted

## 2016-12-27 ENCOUNTER — Other Ambulatory Visit: Payer: Self-pay | Admitting: Family Medicine

## 2016-12-27 DIAGNOSIS — J439 Emphysema, unspecified: Secondary | ICD-10-CM

## 2016-12-27 MED ORDER — FLUTICASONE FUROATE-VILANTEROL 200-25 MCG/INH IN AEPB: 1.0000 | INHALATION_SPRAY | Freq: Every day | RESPIRATORY_TRACT | 3 refills | Status: DC

## 2016-12-27 NOTE — Telephone Encounter (Signed)
I dont see anything in the encounter. What did the pt want?

## 2016-12-27 NOTE — Telephone Encounter (Signed)
[facility].

## 2017-01-18 ENCOUNTER — Other Ambulatory Visit: Payer: Self-pay | Admitting: Family Medicine

## 2017-01-31 ENCOUNTER — Other Ambulatory Visit: Payer: Self-pay | Admitting: Family Medicine

## 2017-01-31 DIAGNOSIS — Z1239 Encounter for other screening for malignant neoplasm of breast: Secondary | ICD-10-CM

## 2017-02-02 ENCOUNTER — Telehealth: Payer: Self-pay | Admitting: Cardiology

## 2017-02-02 NOTE — Telephone Encounter (Signed)
Two release forms mailed to patient home address.

## 2017-02-21 ENCOUNTER — Ambulatory Visit (INDEPENDENT_AMBULATORY_CARE_PROVIDER_SITE_OTHER): Payer: BLUE CROSS/BLUE SHIELD

## 2017-02-21 DIAGNOSIS — Z1239 Encounter for other screening for malignant neoplasm of breast: Secondary | ICD-10-CM

## 2017-02-21 DIAGNOSIS — R928 Other abnormal and inconclusive findings on diagnostic imaging of breast: Secondary | ICD-10-CM | POA: Diagnosis not present

## 2017-02-22 ENCOUNTER — Other Ambulatory Visit: Payer: Self-pay | Admitting: Family Medicine

## 2017-02-22 DIAGNOSIS — R928 Other abnormal and inconclusive findings on diagnostic imaging of breast: Secondary | ICD-10-CM

## 2017-02-24 ENCOUNTER — Emergency Department (INDEPENDENT_AMBULATORY_CARE_PROVIDER_SITE_OTHER)
Admission: EM | Admit: 2017-02-24 | Discharge: 2017-02-24 | Disposition: A | Discharge status: Home / Self Care | Payer: BLUE CROSS/BLUE SHIELD | Source: Home / Self Care | Attending: Family Medicine | Admitting: Family Medicine

## 2017-02-24 ENCOUNTER — Encounter: Payer: Self-pay | Admitting: Emergency Medicine

## 2017-02-24 DIAGNOSIS — N3090 Cystitis, unspecified without hematuria: Secondary | ICD-10-CM | POA: Diagnosis not present

## 2017-02-24 DIAGNOSIS — J0100 Acute maxillary sinusitis, unspecified: Secondary | ICD-10-CM

## 2017-02-24 LAB — POCT URINALYSIS DIP (MANUAL ENTRY)
Bilirubin, UA: NEGATIVE
Blood, UA: NEGATIVE
Glucose, UA: NEGATIVE mg/dL
Ketones, POC UA: NEGATIVE mg/dL
Nitrite, UA: NEGATIVE
Protein Ur, POC: NEGATIVE mg/dL
Spec Grav, UA: 1.02 (ref 1.010–1.025)
Urobilinogen, UA: 0.2 E.U./dL
pH, UA: 7 (ref 5.0–8.0)

## 2017-02-24 MED ORDER — CEFDINIR 300 MG PO CAPS: 300.0000 mg | ORAL_CAPSULE | Freq: Two times a day (BID) | ORAL | 0 refills | Status: DC

## 2017-02-24 NOTE — Discharge Instructions (Signed)
Take plain guaifenesin (1200mg extended release tabs such as Mucinex) twice daily, with plenty of water, for cough and congestion.  May add Pseudoephedrine (30mg, one or two every 4 to 6 hours) for sinus congestion.  Get adequate rest.   °May use Afrin nasal spray (or generic oxymetazoline) each morning for about 5 days and then discontinue.  Also recommend using saline nasal spray several times daily and saline nasal irrigation (AYR is a common brand).  Use Flonase nasal spray each morning after using Afrin nasal spray and saline nasal irrigation. °Try warm salt water gargles for sore throat.  °Stop all antihistamines for now, and other non-prescription cough/cold preparations. °  °  °

## 2017-02-24 NOTE — ED Provider Notes (Signed)
[facility]    CSN: [id number] Arrival date & time: 02/24/17  1211     History   Chief Complaint Chief Complaint  Patient presents with  . Fever    HPI [facility] is a 61 y.o. female.   Patient presents with two complaints: 1)  She has had dysuria and hesitancy for about a week, and right lower back ache for about 2 weeks.  She has now developed increased urgency, and fever to 100 yesterday. 2)  She has developed increased sinus congestion and has developed occasional bloody nasal drainage.   The history is provided by the patient.    Past Medical History:  Diagnosis Date  . COPD (chronic obstructive pulmonary disease) (HCC)   . DIVERTICULOSIS, COLON 12/10/2010   Qualifier: Diagnosis of  By: Orson Aloe MD, Tinnie Gens    . GERD (gastroesophageal reflux disease)   . GLAUCOMA 09/29/2009   Qualifier: Diagnosis of  By: Linford Arnold MD, Santina Evans    . Hypothyroid   . Urinary incontinence 10/19/2011    Patient Active Problem List   Diagnosis Date Noted  . Ventral hernia 08/28/2016  . Prediabetes 07/27/2016  . Nonspecific abnormal electrocardiogram (ECG) (EKG) 07/24/2016  . Rash of face 07/24/2016  . OSA (obstructive sleep apnea) 09/01/2015  . Hypothyroidism 06/01/2015  . Hypersomnia 03/09/2015  . Chronic obstructive pulmonary disease (HCC) 03/23/2014  . Olecranon bursitis of left elbow 12/09/2013  . Spastic colon 10/19/2011  . Urinary incontinence 10/19/2011  . DIVERTICULOSIS, COLON 12/10/2010  . OTHER SPECIFIED DISORDERS OF THYROID 10/13/2010  . GLAUCOMA 09/29/2009  . Asymptomatic postmenopausal status 09/29/2009    Past Surgical History:  Procedure Laterality Date  . BREAST CYST EXCISION    . INCONTINENCE SURGERY    . TONSILLECTOMY    . VAGINAL HYSTERECTOMY      OB History    No data available       Home Medications    Prior to Admission medications   Medication Sig Start Date End Date Taking? Authorizing Provider  albuterol (PROAIR HFA) 108  (90 Base) MCG/ACT inhaler Inhale 2 puffs into the lungs every 6 (six) hours as needed for wheezing or shortness of breath. 02/09/16   Laren Boom, DO  cefdinir (OMNICEF) 300 MG capsule Take 1 capsule (300 mg total) by mouth 2 (two) times daily. 02/24/17   Lattie Haw, MD  Esomeprazole Magnesium (NEXIUM PO) Take by mouth daily.    Historical Provider, MD  fluticasone furoate-vilanterol (BREO ELLIPTA) 200-25 MCG/INH AEPB Inhale 1 puff into the lungs daily. 12/27/16   Rodolph Bong, MD  levothyroxine (SYNTHROID, LEVOTHROID) 88 MCG tablet Take 1 tablet (88 mcg total) by mouth daily. 06/19/16   Sean Hommel, DO  LUMIGAN 0.01 % SOLN Place 1 drop into both eyes at bedtime.  07/21/16   Historical Provider, MD  SPIRIVA RESPIMAT 1.25 MCG/ACT AERS inhale 2 puffs by mouth daily 01/18/17   Rodolph Bong, MD  vitamin C (ASCORBIC ACID) 500 MG tablet Take 500 mg by mouth daily.    Historical Provider, MD    Family History Family History  Problem Relation Age of Onset  . Asthma Mother   . Thyroid disease Mother   . Lung cancer Father     smoker  . Lupus Father     Social History Social History  Substance Use Topics  . Smoking status: Former Smoker    Packs/day: 0.50    Years: 40.00    Types: Cigarettes  . Smokeless tobacco: Current User  Comment: e-cig  . Alcohol use 3.6 oz/week    6 Standard drinks or equivalent per week     Allergies   Patient has no known allergies.   Review of Systems Review of Systems  No sore throat No cough No pleuritic pain No wheezing + nasal congestion + post-nasal drainage + sinus pain/pressure No itchy/red eyes No earache No hemoptysis No SOB + fever, + chills No nausea No vomiting No abdominal pain + right flank pain No diarrhea + urinary symptoms No skin rash + fatigue No myalgias + headache Used OTC meds without relief    Physical Exam Triage Vital Signs ED Triage Vitals  Enc Vitals Group     BP 02/24/17 1303 120/72     Pulse Rate  02/24/17 1303 79     Resp --      Temp 02/24/17 1303 98.5 F (36.9 C)     Temp Source 02/24/17 1303 Oral     SpO2 02/24/17 1303 95 %     Weight 02/24/17 1304 173 lb 8 oz (78.7 kg)     Height 02/24/17 1304  (1.626 m)     Head Circumference --      Peak Flow --      Pain Score 02/24/17 1304 3     Pain Loc --      Pain Edu? --      Excl. in GC? --    No data found.   Updated Vital Signs BP 120/72 (BP Location: Left Arm)   Pulse 79   Temp 98.5 F (36.9 C) (Oral)   Ht  (1.626 m)   Wt 173 lb 8 oz (78.7 kg)   SpO2 95%   BMI 29.78 kg/m   Visual Acuity Right Eye Distance:   Left Eye Distance:   Bilateral Distance:    Right Eye Near:   Left Eye Near:    Bilateral Near:     Physical Exam Nursing notes and Vital Signs reviewed. Appearance:  Patient appears stated age, and in no acute distress Eyes:  Pupils are equal, round, and reactive to light and accomodation.  Extraocular movement is intact.  Conjunctivae are not inflamed  Ears:  Canals normal.  Tympanic membranes normal.  Nose:  Mildly congested turbinates.  Maxillary sinus tenderness is present.  Pharynx:  Normal Neck:  Supple.  No adenopathy. Lungs:  Clear to auscultation.  Breath sounds are equal.  Moving air well. Heart:  Regular rate and rhythm without murmurs, rubs, or gallops.  Abdomen:  Nontender without masses or hepatosplenomegaly.  Bowel sounds are present.  Right flank tenderness is present. Extremities:  No edema.  Skin:  No rash present.    UC Treatments / Results  Labs (all labs ordered are listed, but only abnormal results are displayed) Labs Reviewed  POCT URINALYSIS DIP (MANUAL ENTRY) - Abnormal; Notable for the following:       Result Value   Leukocytes, UA Small (1+) (*)    All other components within normal limits  URINE CULTURE    EKG  EKG Interpretation None       Radiology No results found.  Procedures Procedures (including critical care time)  Medications Ordered  in UC Medications - No data to display   Initial Impression / Assessment and Plan / UC Course  I have reviewed the triage vital signs and the nursing notes.  Pertinent labs & imaging results that were available during my care of the patient were reviewed by me and  considered in my medical decision making (see chart for details).    Begin Omnicef  BID. Urine culture pending. Take plain guaifenesin (  extended release tabs such as Mucinex) twice daily, with plenty of water, for cough and congestion.  May add Pseudoephedrine ( , one or two every 4 to 6 hours) for sinus congestion.  Get adequate rest.   May use Afrin nasal spray (or generic oxymetazoline) each morning for about 5 days and then discontinue.  Also recommend using saline nasal spray several times daily and saline nasal irrigation (AYR is a common brand).  Use Flonase nasal spray each morning after using Afrin nasal spray and saline nasal irrigation. Try warm salt water gargles for sore throat.  Stop all antihistamines for now, and other non-prescription cough/cold preparations. Followup with Family Doctor if not improved in 6 days.   Final Clinical Impressions(s) / UC Diagnoses   Final diagnoses:  Cystitis  Acute maxillary sinusitis, recurrence not specified    New Prescriptions New Prescriptions   CEFDINIR (OMNICEF) 300 MG CAPSULE    Take 1 capsule (300 mg total) by mouth 2 (two) times daily.     Lattie Haw, MD 02/27/17 2215

## 2017-02-24 NOTE — ED Triage Notes (Signed)
Pt c/o fever x 1 day of 100, flank pain, bloody nasal drainage, right flank pain, urgency, dysuria and frequency.

## 2017-02-26 ENCOUNTER — Telehealth: Payer: Self-pay

## 2017-02-26 LAB — URINE CULTURE

## 2017-02-26 NOTE — Telephone Encounter (Signed)
Called patient, Feeling better, no fever, and painful urination is resolving.  Will follow up with PCP or UC as needed.

## 2017-03-02 ENCOUNTER — Ambulatory Visit
Admission: RE | Admit: 2017-03-02 | Discharge: 2017-03-02 | Disposition: A | Discharge status: Home / Self Care | Payer: BLUE CROSS/BLUE SHIELD | Source: Ambulatory Visit | Attending: Family Medicine | Admitting: Family Medicine

## 2017-03-02 DIAGNOSIS — R928 Other abnormal and inconclusive findings on diagnostic imaging of breast: Secondary | ICD-10-CM

## 2017-03-07 ENCOUNTER — Ambulatory Visit (INDEPENDENT_AMBULATORY_CARE_PROVIDER_SITE_OTHER): Payer: BLUE CROSS/BLUE SHIELD | Admitting: Physician Assistant

## 2017-03-07 VITALS — BP 121/71 | HR 59 | Temp 97.9°F | Wt 175.0 lb

## 2017-03-07 DIAGNOSIS — R21 Rash and other nonspecific skin eruption: Secondary | ICD-10-CM | POA: Diagnosis not present

## 2017-03-07 DIAGNOSIS — N3000 Acute cystitis without hematuria: Secondary | ICD-10-CM

## 2017-03-07 LAB — POCT URINALYSIS DIPSTICK
Bilirubin, UA: NEGATIVE
Blood, UA: NEGATIVE
Glucose, UA: NEGATIVE
Ketones, UA: NEGATIVE
Nitrite, UA: NEGATIVE
Protein, UA: NEGATIVE
Spec Grav, UA: <=1.005 — AB (ref 1.010–1.025)
Urobilinogen, UA: 0.2 E.U./dL
pH, UA: 5.5 (ref 5.0–8.0)

## 2017-03-07 MED ORDER — VALACYCLOVIR HCL 1 G PO TABS: 1000.0000 mg | ORAL_TABLET | Freq: Three times a day (TID) | ORAL | 0 refills | Status: DC

## 2017-03-07 MED ORDER — LEVOFLOXACIN 750 MG PO TABS: 750.0000 mg | ORAL_TABLET | Freq: Every day | ORAL | 0 refills | Status: DC

## 2017-03-07 NOTE — Patient Instructions (Signed)
- Take antibiotic once a day for 5 days for your UTI - Drink plenty of water (at least 1 liter a day)  - Take valacyclovir (antivral) three times a day for facial rash - We will call you with results of the culture   Urinary Tract Infection, Adult A urinary tract infection (UTI) is an infection of any part of the urinary tract, which includes the kidneys, ureters, bladder, and urethra. These organs make, store, and get rid of urine in the body. UTI can be a bladder infection (cystitis) or kidney infection (pyelonephritis). What are the causes? This infection may be caused by fungi, viruses, or bacteria. Bacteria are the most common cause of UTIs. This condition can also be caused by repeated incomplete emptying of the bladder during urination. What increases the risk? This condition is more likely to develop if:  You ignore your need to urinate or hold urine for long periods of time.  You do not empty your bladder completely during urination.  You wipe back to front after urinating or having a bowel movement, if you are female.  You are uncircumcised, if you are female.  You are constipated.  You have a urinary catheter that stays in place (indwelling).  You have a weak defense (immune) system.  You have a medical condition that affects your bowels, kidneys, or bladder.  You have diabetes.  You take antibiotic medicines frequently or for long periods of time, and the antibiotics no longer work well against certain types of infections (antibiotic resistance).  You take medicines that irritate your urinary tract.  You are exposed to chemicals that irritate your urinary tract.  You are female. What are the signs or symptoms? Symptoms of this condition include:  Fever.  Frequent urination or passing small amounts of urine frequently.  Needing to urinate urgently.  Pain or burning with urination.  Urine that smells bad or unusual.  Cloudy urine.  Pain in the lower  abdomen or back.  Trouble urinating.  Blood in the urine.  Vomiting or being less hungry than normal.  Diarrhea or abdominal pain.  Vaginal discharge, if you are female. How is this diagnosed? This condition is diagnosed with a medical history and physical exam. You will also need to provide a urine sample to test your urine. Other tests may be done, including:  Blood tests.  Sexually transmitted disease (STD) testing. If you have had more than one UTI, a cystoscopy or imaging studies may be done to determine the cause of the infections. How is this treated? Treatment for this condition often includes a combination of two or more of the following:  Antibiotic medicine.  Other medicines to treat less common causes of UTI.  Over-the-counter medicines to treat pain.  Drinking enough water to stay hydrated. Follow these instructions at home:  Take over-the-counter and prescription medicines only as told by your health care provider.  If you were prescribed an antibiotic, take it as told by your health care provider. Do not stop taking the antibiotic even if you start to feel better.  Avoid alcohol, caffeine, tea, and carbonated beverages. They can irritate your bladder.  Drink enough fluid to keep your urine clear or pale yellow.  Keep all follow-up visits as told by your health care provider. This is important.  Make sure to:  Empty your bladder often and completely. Do not hold urine for long periods of time.  Empty your bladder before and after sex.  Wipe from front to back  after a bowel movement if you are female. Use each tissue one time when you wipe. Contact a health care provider if:  You have back pain.  You have a fever.  You feel nauseous or vomit.  Your symptoms do not get better after 3 days.  Your symptoms go away and then return. Get help right away if:  You have severe back pain or lower abdominal pain.  You are vomiting and cannot keep down  any medicines or water. This information is not intended to replace advice given to you by your health care provider. Make sure you discuss any questions you have with your health care provider. Document Released: 08/09/2005 Document Revised: 04/12/2016 Document Reviewed: 09/20/2015 Elsevier Interactive Patient Education  2017 [employer].

## 2017-03-07 NOTE — Progress Notes (Signed)
HPI:                                                                Diana Solis is a 61 y.o. female who presents to [facility]: Primary Care Sports Medicine today for   Patient reports right-sided back pain for approximately 2 weeks. Patient states she completed antibiotic therapy for a UTI and this helped with her urinary symptoms, but states her back pain never resolved. Patient was seen in urgent care on 02/24/17 and diagnosed with acute cystitis and prescribed Omnicef. She denies fever, chills, abdominal pain, dysuria, frequency, urgency, or hematuria.  Additionally patient reports a left-sided cheek rash that has been bothering her for a year. She states it erupts intermittently in red papules that drain clear fluid. She states rash is very tender. She has tried a topical prescription steroid cream and states this did not help. She is concerned that it could be herpes. She reports a history of herpes labialis.   Past Medical History:  Diagnosis Date  . COPD (chronic obstructive pulmonary disease) (HCC)   . DIVERTICULOSIS, COLON 12/10/2010   Qualifier: Diagnosis of  By: Orson Aloe MD, Tinnie Gens    . GERD (gastroesophageal reflux disease)   . GLAUCOMA 09/29/2009   Qualifier: Diagnosis of  By: Linford Arnold MD, Santina Evans    . Hypothyroid   . Urinary incontinence 10/19/2011   Past Surgical History:  Procedure Laterality Date  . BREAST CYST EXCISION    . INCONTINENCE SURGERY    . TONSILLECTOMY    . VAGINAL HYSTERECTOMY     Social History  Substance Use Topics  . Smoking status: Former Smoker    Packs/day: 0.50    Years: 40.00    Types: Cigarettes  . Smokeless tobacco: Current User     Comment: e-cig  . Alcohol use 3.6 oz/week    6 Standard drinks or equivalent per week   family history includes Asthma in her mother; Lung cancer in her father; Lupus in her father; Thyroid disease in her mother.  ROS: negative except as noted in the HPI  Medications: Current  Outpatient Prescriptions  Medication Sig Dispense Refill  . Multiple Vitamin (MULTIVITAMIN) tablet Take 1 tablet by mouth daily.    Marland Kitchen albuterol (PROAIR HFA) 108 (90 Base) MCG/ACT inhaler Inhale 2 puffs into the lungs every 6 (six) hours as needed for wheezing or shortness of breath. 18 g 1  . Esomeprazole Magnesium (NEXIUM PO) Take by mouth daily.    . fluticasone furoate-vilanterol (BREO ELLIPTA) 200-25 MCG/INH AEPB Inhale 1 puff into the lungs daily. 60 each 3  . levothyroxine (SYNTHROID, LEVOTHROID) 88 MCG tablet Take 1 tablet (88 mcg total) by mouth daily. 90 tablet 1  . LUMIGAN 0.01 % SOLN Place 1 drop into both eyes at bedtime.   0  . SPIRIVA RESPIMAT 1.25 MCG/ACT AERS inhale 2 puffs by mouth daily 4 g 1  . vitamin C (ASCORBIC ACID) 500 MG tablet Take 500 mg by mouth daily.     No current facility-administered medications for this visit.    No Known Allergies     Objective:  BP 121/71   Pulse (!) 59   Temp 97.9 F (36.6 C) (Oral)   Wt 175 lb (79.4 kg)   BMI 30.04  kg/m  Gen: well-groomed, cooperative, not ill-appearing, no distress Pulm: Normal work of breathing, normal phonation GI: soft, nondistended, nontender, no CVA tenderness Neuro: alert and oriented x 3, EOM's intact Lymph: no cervical or tonsillar adenopathy Skin: warm and dry, left cheek with erythema and 3, approx 2mm crusted lesions   Results for orders placed or performed in visit on 03/07/17 (from the past 72 hour(s))  POCT Urinalysis Dipstick     Status: Abnormal   Collection Time: 03/07/17 10:39 AM  Result Value Ref Range   Color, UA light yellow    Clarity, UA clear    Glucose, UA negative    Bilirubin, UA negative    Ketones, UA negative    Spec Grav, UA <=1.005 (A) 1.010 - 1.025   Blood, UA negative    pH, UA 5.5 5.0 - 8.0   Protein, UA negative    Urobilinogen, UA 0.2 0.2 or 1.0 E.U./dL   Nitrite, UA negative    Leukocytes, UA Moderate (2+) (A) Negative   No results found.    Assessment  and Plan: 62 y.o. female with   1. Acute cystitis without hematuria - POCT Urinalysis Dipstick positive for moderate leukocytes - reviewed patient's urine culture from 02/24/17, which showed 10-50,000 Group B Strep isolates - levofloxacin (LEVAQUIN) 750 MG tablet; Take 1 tablet (750 mg total) by mouth daily.  Dispense: 5 tablet; Refill: 0 - Urine culture pending  2. Facial rash - unroofed one of the facial lesions that had scabbed over to obtain a viral culture - valACYclovir (VALTREX) 1000 MG tablet; Take 1 tablet (1,000 mg total) by mouth 3 (three) times daily.  Dispense: 21 tablet; Refill: 0 - Viral culture; Future  Patient education and anticipatory guidance given Patient agrees with treatment plan Follow-up as needed if symptoms worsen or fail to improve  Levonne Hubert PA-C

## 2017-03-08 LAB — URINE CULTURE

## 2017-03-12 LAB — REFLEX ADENOVIRUS CULTURE

## 2017-03-12 LAB — RFX HSV/VARICELLA ZOSTER RAPID CULT

## 2017-03-14 LAB — VIRAL CULTURE VIRC

## 2017-03-14 LAB — CYTOMEGALOVIRUS CULTURE

## 2017-03-15 ENCOUNTER — Other Ambulatory Visit: Payer: Self-pay | Admitting: Physician Assistant

## 2017-03-15 ENCOUNTER — Ambulatory Visit (INDEPENDENT_AMBULATORY_CARE_PROVIDER_SITE_OTHER): Payer: BLUE CROSS/BLUE SHIELD | Admitting: Family Medicine

## 2017-03-15 ENCOUNTER — Encounter: Payer: Self-pay | Admitting: Physician Assistant

## 2017-03-15 VITALS — BP 128/60 | HR 61 | Ht 64.0 in | Wt 174.0 lb

## 2017-03-15 DIAGNOSIS — R8299 Other abnormal findings in urine: Secondary | ICD-10-CM | POA: Diagnosis not present

## 2017-03-15 DIAGNOSIS — J439 Emphysema, unspecified: Secondary | ICD-10-CM

## 2017-03-15 DIAGNOSIS — B001 Herpesviral vesicular dermatitis: Secondary | ICD-10-CM

## 2017-03-15 DIAGNOSIS — B0089 Other herpesviral infection: Secondary | ICD-10-CM

## 2017-03-15 DIAGNOSIS — N390 Urinary tract infection, site not specified: Secondary | ICD-10-CM | POA: Diagnosis not present

## 2017-03-15 DIAGNOSIS — R7303 Prediabetes: Secondary | ICD-10-CM

## 2017-03-15 DIAGNOSIS — R82998 Other abnormal findings in urine: Secondary | ICD-10-CM

## 2017-03-15 DIAGNOSIS — E039 Hypothyroidism, unspecified: Secondary | ICD-10-CM

## 2017-03-15 LAB — COMPLETE METABOLIC PANEL WITH GFR
ALT: 16 U/L (ref 6–29)
AST: 17 U/L (ref 10–35)
Albumin: 4.5 g/dL (ref 3.6–5.1)
Alkaline Phosphatase: 80 U/L (ref 33–130)
BUN: 13 mg/dL (ref 7–25)
CO2: 28 mmol/L (ref 20–31)
Calcium: 9.8 mg/dL (ref 8.6–10.4)
Chloride: 102 mmol/L (ref 98–110)
Creat: 0.66 mg/dL (ref 0.50–0.99)
GFR, Est African American: >89 mL/min (ref >=60)
GFR, Est Non African American: >89 mL/min (ref >=60)
Glucose, Bld: 86 mg/dL (ref 65–99)
Potassium: 4.5 mmol/L (ref 3.5–5.3)
Sodium: 139 mmol/L (ref 135–146)
Total Bilirubin: 0.3 mg/dL (ref 0.2–1.2)
Total Protein: 7 g/dL (ref 6.1–8.1)

## 2017-03-15 LAB — CBC
HCT: 40.2 % (ref 35.0–45.0)
Hemoglobin: 13.4 g/dL (ref 11.7–15.5)
MCH: 29.5 pg (ref 27.0–33.0)
MCHC: 33.3 g/dL (ref 32.0–36.0)
MCV: 88.5 fL (ref 80.0–100.0)
MPV: 9.3 fL (ref 7.5–12.5)
Platelets: 281 10*3/uL (ref 140–400)
RBC: 4.54 MIL/uL (ref 3.80–5.10)
RDW: 13.4 % (ref 11.0–15.0)
WBC: 4.1 10*3/uL (ref 3.8–10.8)

## 2017-03-15 LAB — POCT URINALYSIS DIPSTICK
Bilirubin, UA: NEGATIVE
Blood, UA: NEGATIVE
Glucose, UA: NEGATIVE
Ketones, UA: NEGATIVE
Nitrite, UA: NEGATIVE
Protein, UA: NEGATIVE
Spec Grav, UA: <=1.005 — AB (ref 1.010–1.025)
Urobilinogen, UA: 0.2 E.U./dL
pH, UA: 5.5 (ref 5.0–8.0)

## 2017-03-15 LAB — T3, FREE: T3, Free: 2.9 pg/mL (ref 2.3–4.2)

## 2017-03-15 LAB — TSH: TSH: 2.11 mIU/L

## 2017-03-15 LAB — T4, FREE: Free T4: 1.5 ng/dL (ref 0.8–1.8)

## 2017-03-15 MED ORDER — VALACYCLOVIR HCL 1 G PO TABS: 1000.0000 mg | ORAL_TABLET | Freq: Every day | ORAL | 1 refills | Status: DC

## 2017-03-15 MED ORDER — TIOTROPIUM BROMIDE MONOHYDRATE 1.25 MCG/ACT IN AERS: 1.0000 | INHALATION_SPRAY | Freq: Every day | RESPIRATORY_TRACT | 12 refills | Status: DC

## 2017-03-15 MED ORDER — FLUTICASONE FUROATE-VILANTEROL 200-25 MCG/INH IN AEPB: 1.0000 | INHALATION_SPRAY | Freq: Every day | RESPIRATORY_TRACT | 3 refills | Status: DC

## 2017-03-15 MED ORDER — VALACYCLOVIR HCL 1 G PO TABS: ORAL_TABLET | ORAL | 5 refills | Status: DC

## 2017-03-15 NOTE — Progress Notes (Signed)
Diana Solis is a 61 y.o. female who presents to [facility]: Primary Care Sports Medicine today for UTI and facial rash.  Patient was seen last week for both problems and was prescribed levofloxacin after urine culture grew GBS.  She has also finished cefdinir from urgent care for the same symptoms.    Facial rash: painful vesicular rash on right cheek.  viral culture grew HSV, unable to tell VZV given presence of HSV   Past Medical History:  Diagnosis Date  . COPD (chronic obstructive pulmonary disease) (HCC)   . DIVERTICULOSIS, COLON 12/10/2010   Qualifier: Diagnosis of  By: Orson Aloe MD, Tinnie Gens    . GERD (gastroesophageal reflux disease)   . GLAUCOMA 09/29/2009   Qualifier: Diagnosis of  By: Linford Arnold MD, Santina Evans    . Hypothyroid   . Urinary incontinence 10/19/2011   Past Surgical History:  Procedure Laterality Date  . BREAST CYST EXCISION    . INCONTINENCE SURGERY    . TONSILLECTOMY    . VAGINAL HYSTERECTOMY     Social History  Substance Use Topics  . Smoking status: Former Smoker    Packs/day: 0.50    Years: 40.00    Types: Cigarettes  . Smokeless tobacco: Current User     Comment: e-cig  . Alcohol use 3.6 oz/week    6 Standard drinks or equivalent per week   family history includes Asthma in her mother; Lung cancer in her father; Lupus in her father; Thyroid disease in her mother.  ROS as above:  Medications: Current Outpatient Prescriptions  Medication Sig Dispense Refill  . albuterol (PROAIR HFA) 108 (90 Base) MCG/ACT inhaler Inhale 2 puffs into the lungs every 6 (six) hours as needed for wheezing or shortness of breath. 18 g 1  . Esomeprazole Magnesium (NEXIUM PO) Take by mouth daily.    . fluticasone furoate-vilanterol (BREO ELLIPTA) 200-25 MCG/INH AEPB Inhale 1 puff into the lungs daily. 60 each 3  . levothyroxine (SYNTHROID, LEVOTHROID) 88 MCG tablet Take 1 tablet  (88 mcg total) by mouth daily. 90 tablet 1  . LUMIGAN 0.01 % SOLN Place 1 drop into both eyes at bedtime.   0  . Multiple Vitamin (MULTIVITAMIN) tablet Take 1 tablet by mouth daily.    . Tiotropium Bromide Monohydrate (SPIRIVA RESPIMAT) 1.25 MCG/ACT AERS Inhale 1 puff into the lungs daily. 4 g 12  . vitamin C (ASCORBIC ACID) 500 MG tablet Take 500 mg by mouth daily.    . valACYclovir (VALTREX) 1000 MG tablet Take 1 tablet (1,000 mg total) by mouth daily. Take 2 tablets by mouth every 12 hours for 1 day 90 tablet 1   No current facility-administered medications for this visit.    No Known Allergies  Health Maintenance Health Maintenance  Topic Date Due  . HIV Screening  03/12/1971  . COLONOSCOPY  11/13/2010  . INFLUENZA VACCINE  06/13/2017  . MAMMOGRAM  02/22/2019  . TETANUS/TDAP  10/13/2020  . Hepatitis C Screening  Completed     Exam:  BP 128/60   Pulse 61   Ht 5\' 4"  (1.626 m)   Wt 174 lb (78.9 kg)   BMI 29.87 kg/m  Gen: Well NAD HEENT: EOMI,  MMM Lungs: Normal work of breathing. CTABL Heart: RRR no MRG Abd: NABS, Soft. Nondistended, Nontender Exts: Brisk capillary refill, warm and well perfused.  Skin: Right vesicular erythematous   Results for orders placed or performed in visit on 03/15/17 (from the past 72 hour(s))  POCT urinalysis dipstick     Status: Abnormal   Collection Time: 03/15/17 10:14 AM  Result Value Ref Range   Color, UA yellow    Clarity, UA clear    Glucose, UA neg    Bilirubin, UA neg    Ketones, UA neg    Spec Grav, UA <=1.005 (A) 1.010 - 1.025   Blood, UA neg    pH, UA 5.5 5.0 - 8.0   Protein, UA neg    Urobilinogen, UA 0.2 0.2 or 1.0 E.U./dL   Nitrite, UA neg    Leukocytes, UA Trace (A) Negative   No results found.    Assessment and Plan: 61 y.o. female with UTI, herpes gladiform,  Daily 1g valacyclovir for preventing future HSV outbreaks.   Urinary symptoms improving, wait for culture in case treatment needs to change.   Labs  done.    Orders Placed This Encounter  Procedures  . Urine Culture  . CBC  . COMPLETE METABOLIC PANEL WITH GFR  . T4, free  . T3, free  . TSH  . Hemoglobin A1c  . POCT urinalysis dipstick   Meds ordered this encounter  Medications  . Tiotropium Bromide Monohydrate (SPIRIVA RESPIMAT) 1.25 MCG/ACT AERS    Sig: Inhale 1 puff into the lungs daily.    Dispense:  4 g    Refill:  12  . DISCONTD: fluticasone furoate-vilanterol (BREO ELLIPTA) 200-25 MCG/INH AEPB    Sig: Inhale 1 puff into the lungs daily.    Dispense:  60 each    Refill:  3  . valACYclovir (VALTREX) 1000 MG tablet    Sig: Take 1 tablet (1,000 mg total) by mouth daily. Take 2 tablets by mouth every 12 hours for 1 day    Dispense:  90 tablet    Refill:  1  . fluticasone furoate-vilanterol (BREO ELLIPTA) 200-25 MCG/INH AEPB    Sig: Inhale 1 puff into the lungs daily.    Dispense:  60 each    Refill:  3     Discussed warning signs or symptoms. Please see discharge instructions. Patient expresses understanding.

## 2017-03-15 NOTE — Patient Instructions (Signed)
Thank you for coming in today. Take valtrex daily.  We will check labs today.  Return in 6 months or sooner if needed.  Call or go to the emergency room if you get worse, have trouble breathing, have chest pains, or palpitations.

## 2017-03-15 NOTE — Progress Notes (Signed)
Patient's viral culture of her facial rash positive for HSV.  Prescription sent for Valacyclovir 2g bid for 1 day with refills.

## 2017-03-16 LAB — HEMOGLOBIN A1C
Hgb A1c MFr Bld: 5.6 % (ref <5.7)
Mean Plasma Glucose: 114 mg/dL

## 2017-03-17 LAB — URINE CULTURE: Organism ID, Bacteria: NO GROWTH

## 2017-03-19 ENCOUNTER — Telehealth: Payer: Self-pay | Admitting: Family Medicine

## 2017-03-19 DIAGNOSIS — B001 Herpesviral vesicular dermatitis: Secondary | ICD-10-CM

## 2017-03-19 MED ORDER — VALACYCLOVIR HCL 1 G PO TABS: 1000.0000 mg | ORAL_TABLET | Freq: Every day | ORAL | 1 refills | Status: DC

## 2017-03-19 NOTE — Telephone Encounter (Signed)
Corrected dose instructions for valtrex rx

## 2017-03-22 ENCOUNTER — Encounter: Payer: Self-pay | Admitting: Family Medicine

## 2017-03-22 ENCOUNTER — Ambulatory Visit (INDEPENDENT_AMBULATORY_CARE_PROVIDER_SITE_OTHER): Payer: BLUE CROSS/BLUE SHIELD | Admitting: Family Medicine

## 2017-03-22 DIAGNOSIS — K5900 Constipation, unspecified: Secondary | ICD-10-CM | POA: Diagnosis not present

## 2017-03-22 DIAGNOSIS — R109 Unspecified abdominal pain: Secondary | ICD-10-CM | POA: Diagnosis not present

## 2017-03-22 MED ORDER — CEFDINIR 300 MG PO CAPS: 300.0000 mg | ORAL_CAPSULE | Freq: Two times a day (BID) | ORAL | 0 refills | Status: DC

## 2017-03-22 MED ORDER — POLYETHYLENE GLYCOL 3350 17 GM/SCOOP PO POWD: 17.0000 g | Freq: Every day | ORAL | 12 refills | Status: DC

## 2017-03-22 NOTE — Patient Instructions (Signed)
Thank you for coming in today. Start omnicef.  Recheck as needed.  Take miralax daily.  If constipation is not better we will consider Amitiza.   Lubiprostone oral capsule What is this medicine? LUBIPROSTONE (loo bi PROS tone) is a laxative. It is used to treat chronic constipation and constipation caused by opioids (certain prescription pain medicines). It is also used to treat adult women with irritable bowel syndrome who have constipation. This medicine may be used for other purposes; ask your health care provider or pharmacist if you have questions. COMMON BRAND NAME(S): Amitiza What should I tell my health care provider before I take this medicine? They need to know if you have any of these conditions: -cancer or tumor in abdomen, intestine, or stomach -history of bowel obstruction or adhesions -history of stool (fecal) impaction -liver disease -an unusual or allergic reaction to lubiprostone, other medicines, foods, dyes, or preservatives -pregnant or trying to get pregnant -breast-feeding How should I use this medicine? Take this medicine by mouth with a glass of water. Follow the directions on the prescription label. Do not cut, crush or chew this medicine. Take this medicine with food. Take your medicine at regular intervals. Do not take your medicine more often than directed. Do not stop taking except on your doctor's advice. Talk to your pediatrician regarding the use of this medicine in children. Special care may be needed. Overdosage: If you think you have taken too much of this medicine contact a poison control center or emergency room at once. NOTE: This medicine is only for you. Do not share this medicine with others. What if I miss a dose? If you miss a dose, take it as soon as you can. If it is almost time for your next dose, take only that dose. Do not take double or extra doses. What may interact with this medicine? -medicines that treat diarrhea -methadone -other  medicines for constipation This list may not describe all possible interactions. Give your health care provider a list of all the medicines, herbs, non-prescription drugs, or dietary supplements you use. Also tell them if you smoke, drink alcohol, or use illegal drugs. Some items may interact with your medicine. What should I watch for while using this medicine? Visit your doctor for regular check ups. Tell your doctor if your symptoms do not get better or if they get worse. What side effects may I notice from receiving this medicine? Side effects that you should report to your doctor or health care professional as soon as possible: -allergic reactions like skin rash, itching or hives, swelling of the face, lips, or tongue -feeling faint or lightheaded, falls -new or worsening stomach pain -severe or prolonged diarrhea -vomiting Side effects that usually do not require medical attention (report to your doctor or health care professional if they continue or are bothersome): -headache -loose stools -nausea This list may not describe all possible side effects. Call your doctor for medical advice about side effects. You may report side effects to FDA at 1-800-FDA-1088. Where should I keep my medicine? Keep out of the reach of children. Store at room temperature between 15 and 30 degrees C (59 and 86 degrees F). Throw away any unused medicine after the expiration date. NOTE: This sheet is a summary. It may not cover all possible information. If you have questions about this medicine, talk to your doctor, pharmacist, or health care provider.  2018 Elsevier/Gold Standard (2015-12-02 11:04:22)

## 2017-03-22 NOTE — Progress Notes (Signed)
Diana Solis is a 61 y.o. female [facility] today for fever back pain and constipation..  Patient notes a 2 day history of fever. Her temperature max was 101 at home. This is associated with mild right-sided low back pain. She was seen last week for urinary frequency and urgency have a urine culture was negative. She denies vomiting or diarrhea.  She does however note constipation. This is been ongoing for years. She takes stool softeners and fiber daily and MiraLAX intermittently. She can go on multiple days between bowel movements. She produces hard firm stools.   Past Medical History:  Diagnosis Date  . COPD (chronic obstructive pulmonary disease) (HCC)   . DIVERTICULOSIS, COLON 12/10/2010   Qualifier: Diagnosis of  By: Orson AloeHenderson MD, Tinnie GensJeffrey    . GERD (gastroesophageal reflux disease)   . GLAUCOMA 09/29/2009   Qualifier: Diagnosis of  By: Linford ArnoldMetheney MD, Santina Evansatherine    . Hypothyroid   . Urinary incontinence 10/19/2011   Past Surgical History:  Procedure Laterality Date  . BREAST CYST EXCISION    . INCONTINENCE SURGERY    . TONSILLECTOMY    . VAGINAL HYSTERECTOMY     Social History  Substance Use Topics  . Smoking status: Former Smoker    Packs/day: 0.50    Years: 40.00    Types: Cigarettes  . Smokeless tobacco: Current User     Comment: e-cig  . Alcohol use 3.6 oz/week    6 Standard drinks or equivalent per week   family history includes Asthma in her mother; Lung cancer in her father; Lupus in her father; Thyroid disease in her mother.  ROS as above:  Medications: Current Outpatient Prescriptions  Medication Sig Dispense Refill  . albuterol (PROAIR HFA) 108 (90 Base) MCG/ACT inhaler Inhale 2 puffs into the lungs every 6 (six) hours as needed for wheezing or shortness of breath. 18 g 1  . Esomeprazole Magnesium (NEXIUM PO) Take by  mouth daily.    . fluticasone furoate-vilanterol (BREO ELLIPTA) 200-25 MCG/INH AEPB Inhale 1 puff into the lungs daily. 60 each 3  . levothyroxine (SYNTHROID, LEVOTHROID) 88 MCG tablet Take 1 tablet (88 mcg total) by mouth daily. 90 tablet 1  . LUMIGAN 0.01 % SOLN Place 1 drop into both eyes at bedtime.   0  . Multiple Vitamin (MULTIVITAMIN) tablet Take 1 tablet by mouth daily.    . Tiotropium Bromide Monohydrate (SPIRIVA RESPIMAT) 1.25 MCG/ACT AERS Inhale 1 puff into the lungs daily. 4 g 12  . valACYclovir (VALTREX) 1000 MG tablet Take 1 tablet (1,000 mg total) by mouth daily. 90 tablet 1  . vitamin C (ASCORBIC ACID) 500 MG tablet Take 500 mg by mouth daily.    . cefdinir (OMNICEF) 300 MG capsule Take 1 capsule (300 mg total) by mouth 2 (two) times daily. 14 capsule 0  . polyethylene glycol powder (GLYCOLAX/MIRALAX) powder Take 17 g by mouth daily. 850 g 12   No current facility-administered medications for this visit.    No Known Allergies  Health Maintenance Health Maintenance  Topic Date Due  . HIV Screening  03/12/1971  . COLONOSCOPY  11/13/2010  . INFLUENZA VACCINE  06/13/2017  . MAMMOGRAM  02/22/2019  . TETANUS/TDAP  10/13/2020  . Hepatitis C Screening  Completed     Exam:  BP 135/77   Pulse 64   Temp 98 F (36.7 C) (Oral)   Wt 174 lb (78.9 kg)  BMI 29.87 kg/m  Gen: Well NAD HEENT: EOMI,  MMM Normal posterior pharynx and nasal passages. Tympanic membranes no cervical lymphadenopathy Lungs: Normal work of breathing. CTABL Heart: RRR no MRG Abd: NABS, Soft. Nondistended, Nontender No CVA angle tenderness to percussion Exts: Brisk capillary refill, warm and well perfused.  MSK: Thoracic and lumbar spine areas are nontender with normal motion.   No results found for this or any previous visit (from the past 72 hour(s)). No results found.    Assessment and Plan: 61 y.o. female with  Subjective fever associated with mild back pain/flank pain and urinary symptoms.  Urine culture pending. Empiric treatment with Omnicef.  Constipation: Sounds chronic. Plan increase MiraLAX to daily. If not better will use one of the prescription branded anticonstipation medications such as Lubiprostone.   Orders Placed This Encounter  Procedures  . Urine culture   Meds ordered this encounter  Medications  . polyethylene glycol powder (GLYCOLAX/MIRALAX) powder    Sig: Take 17 g by mouth daily.    Dispense:  850 g    Refill:  12  . cefdinir (OMNICEF) 300 MG capsule    Sig: Take 1 capsule (300 mg total) by mouth 2 (two) times daily.    Dispense:  14 capsule    Refill:  0     Discussed warning signs or symptoms. Please see discharge instructions. Patient expresses understanding.

## 2017-03-23 LAB — URINE CULTURE

## 2017-03-26 LAB — HM COLONOSCOPY

## 2017-04-13 ENCOUNTER — Encounter: Payer: Self-pay | Admitting: Family Medicine

## 2017-04-23 ENCOUNTER — Ambulatory Visit (INDEPENDENT_AMBULATORY_CARE_PROVIDER_SITE_OTHER): Payer: BLUE CROSS/BLUE SHIELD | Admitting: Family Medicine

## 2017-04-23 ENCOUNTER — Encounter: Payer: Self-pay | Admitting: Family Medicine

## 2017-04-23 ENCOUNTER — Ambulatory Visit (INDEPENDENT_AMBULATORY_CARE_PROVIDER_SITE_OTHER): Payer: BLUE CROSS/BLUE SHIELD

## 2017-04-23 VITALS — BP 106/63 | HR 80 | Temp 97.7°F | Wt 174.0 lb

## 2017-04-23 DIAGNOSIS — R05 Cough: Secondary | ICD-10-CM

## 2017-04-23 DIAGNOSIS — R109 Unspecified abdominal pain: Secondary | ICD-10-CM | POA: Diagnosis not present

## 2017-04-23 DIAGNOSIS — R300 Dysuria: Secondary | ICD-10-CM | POA: Diagnosis not present

## 2017-04-23 DIAGNOSIS — J449 Chronic obstructive pulmonary disease, unspecified: Secondary | ICD-10-CM

## 2017-04-23 DIAGNOSIS — R059 Cough, unspecified: Secondary | ICD-10-CM

## 2017-04-23 LAB — POCT URINALYSIS DIPSTICK
Bilirubin, UA: NEGATIVE
Blood, UA: NEGATIVE
Glucose, UA: NEGATIVE
Nitrite, UA: NEGATIVE
Protein, UA: NEGATIVE
Spec Grav, UA: 1.02 (ref 1.010–1.025)
Urobilinogen, UA: 0.2 E.U./dL
pH, UA: 5.5 (ref 5.0–8.0)

## 2017-04-23 IMAGING — DX DG CHEST 2V
2 series · 2 of 2 positions shown · non-contrast
Comparison: 08/25/2015

CLINICAL DATA: Cough and congestion for several days

EXAM:
CHEST  2 VIEW

[chest pa]
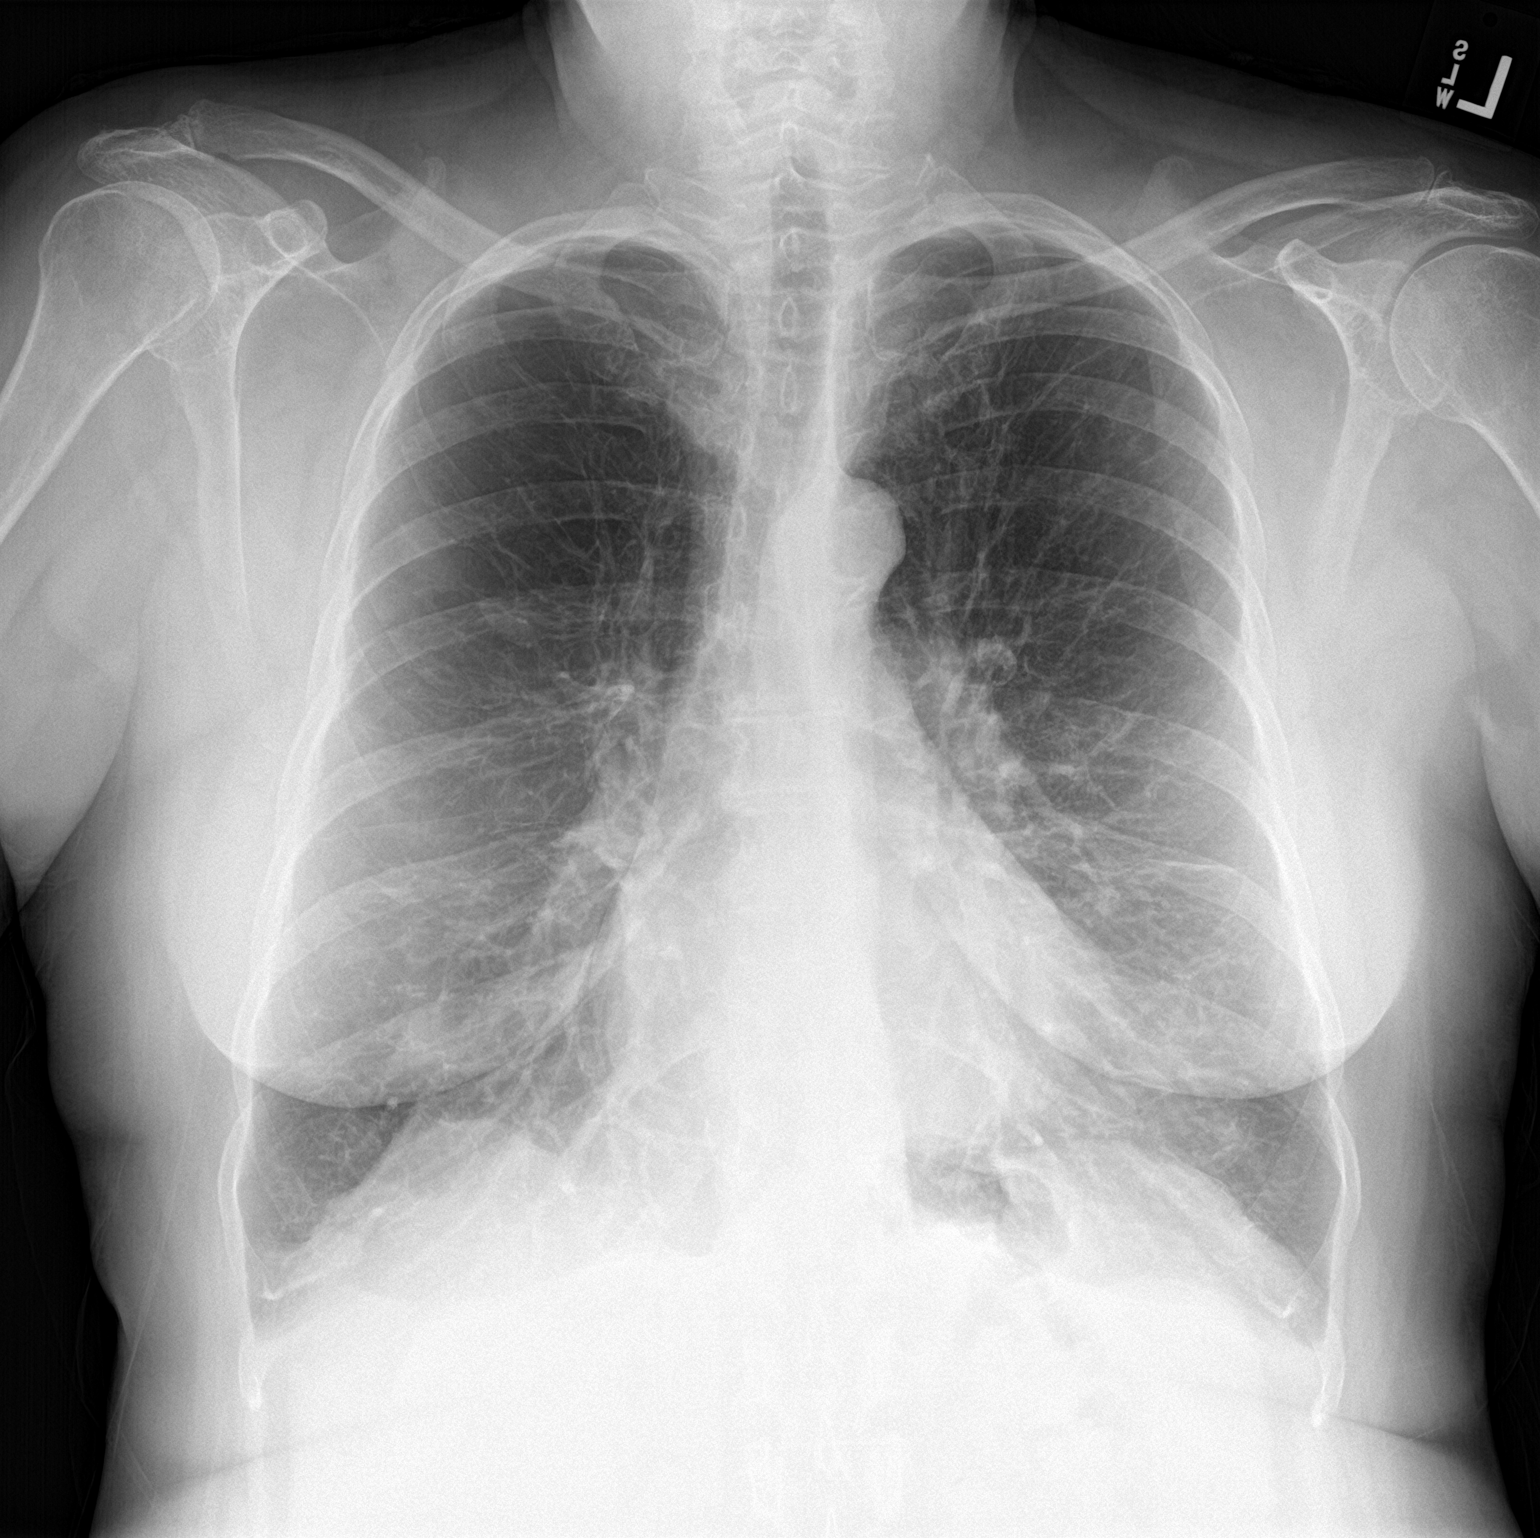

[chest lat]
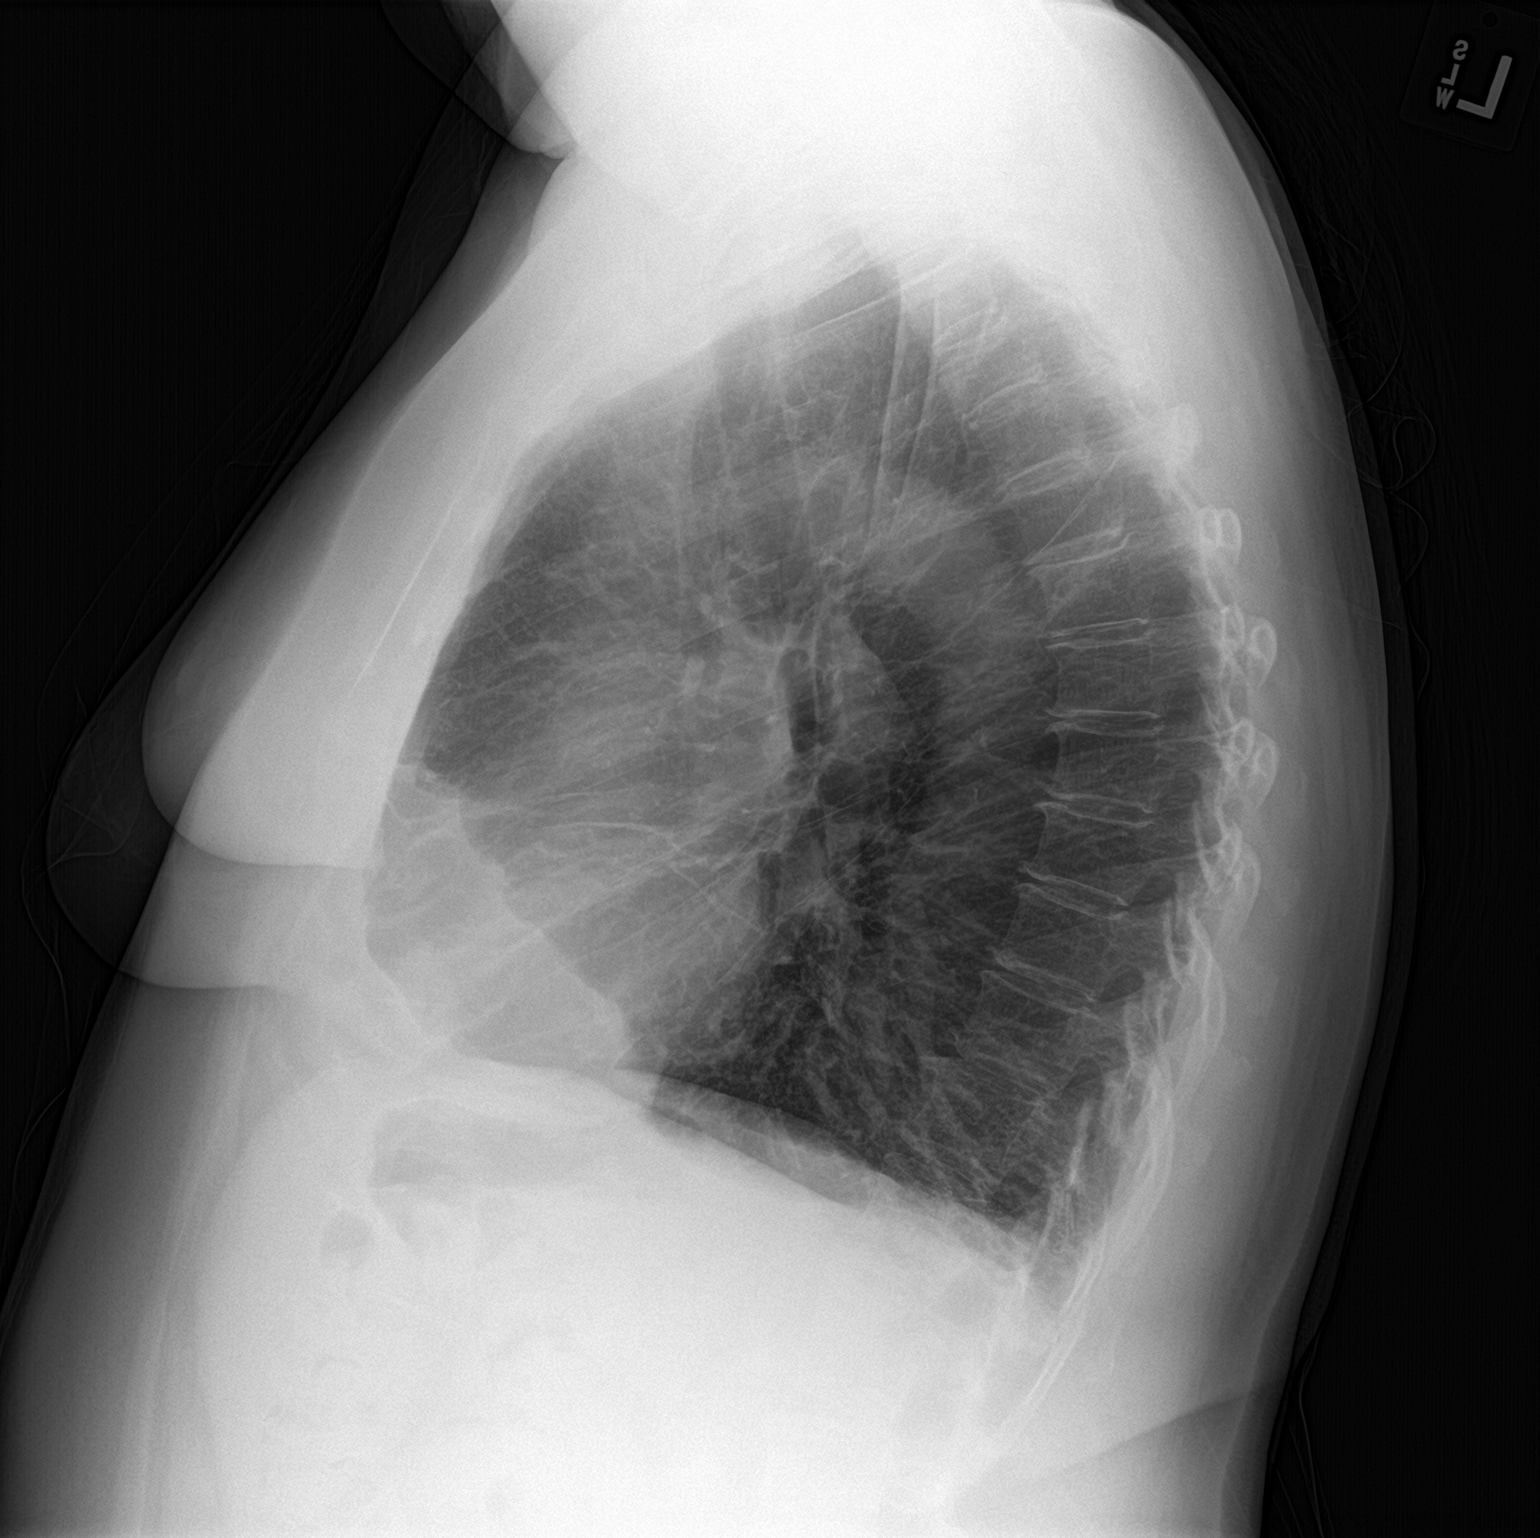

[2 of 2 positions shown; findings below may reference images not displayed]

FINDINGS: Cardiac shadow is within normal limits. The lungs are hyperinflated.
Mild scarring is noted in the bases bilaterally stable from the
previous exam. No acute infiltrate or sizable effusion is noted.
IMPRESSION: COPD without acute abnormality.

## 2017-04-23 MED ORDER — CEFDINIR 300 MG PO CAPS: 300.0000 mg | ORAL_CAPSULE | Freq: Two times a day (BID) | ORAL | 0 refills | Status: DC

## 2017-04-23 NOTE — Progress Notes (Signed)
Diana BickerLesa Solis is a 61 y.o. female who [facility] Sol[facility]: Primary Care Sports Medicine today for right flank pain, dysuria, increased urinary frequency and urgency.  Her back started to ache 4 days ago, it is a dull, constant pain that radiates to the groin and is severe enough to keep her up at night.  She was seen last month for similar symptoms in her low back and was treated empirically with cefdinir with improvement of symptoms.  She also started experiencing a cough and sinus pressure around this time. She denies fever, blood in the urine.   Past Medical History:  Diagnosis Date  . COPD (chronic obstructive pulmonary disease) (HCC)   . DIVERTICULOSIS, COLON 12/10/2010   Qualifier: Diagnosis of  By: Orson AloeHenderson MD, Tinnie GensJeffrey    . GERD (gastroesophageal reflux disease)   . GLAUCOMA 09/29/2009   Qualifier: Diagnosis of  By: Linford ArnoldMetheney MD, Santina Evansatherine    . Hypothyroid   . Urinary incontinence 10/19/2011   Past Surgical History:  Procedure Laterality Date  . BREAST CYST EXCISION    . INCONTINENCE SURGERY    . TONSILLECTOMY    . VAGINAL HYSTERECTOMY     Social History  Substance Use Topics  . Smoking status: Former Smoker    Packs/day: 0.50    Years: 40.00    Types: Cigarettes  . Smokeless tobacco: Current User     Comment: e-cig  . Alcohol use 3.6 oz/week    6 Standard drinks or equivalent per week   family history includes Asthma in her mother; Lung cancer in her father; Lupus in her father; Thyroid disease in her mother.  ROS as above:  Medications: Current Outpatient Prescriptions  Medication Sig Dispense Refill  . albuterol (PROAIR HFA) 108 (90 Base) MCG/ACT inhaler Inhale 2 puffs into the lungs every 6 (six) hours as needed for wheezing or shortness of breath. 18 g 1  . cefdinir (OMNICEF) 300 MG capsule Take 1 capsule (300 mg total) by mouth 2 (two) times daily. 14 capsule 0  .  Esomeprazole Magnesium (NEXIUM PO) Take by mouth daily.    . fluticasone furoate-vilanterol (BREO ELLIPTA) 200-25 MCG/INH AEPB Inhale 1 puff into the lungs daily. 60 each 3  . levothyroxine (SYNTHROID, LEVOTHROID) 88 MCG tablet Take 1 tablet (88 mcg total) by mouth daily. 90 tablet 1  . LUMIGAN 0.01 % SOLN Place 1 drop into both eyes at bedtime.   0  . Multiple Vitamin (MULTIVITAMIN) tablet Take 1 tablet by mouth daily.    . polyethylene glycol powder (GLYCOLAX/MIRALAX) powder Take 17 g by mouth daily. 850 g 12  . Tiotropium Bromide Monohydrate (SPIRIVA RESPIMAT) 1.25 MCG/ACT AERS Inhale 1 puff into the lungs daily. 4 g 12  . valACYclovir (VALTREX) 1000 MG tablet Take 1 tablet (1,000 mg total) by mouth daily. 90 tablet 1  . vitamin C (ASCORBIC ACID) 500 MG tablet Take 500 mg by mouth daily.     No current facility-administered medications for this visit.    No Known Allergies  Health Maintenance Health Maintenance  Topic Date Due  . HIV Screening  03/12/1971  . INFLUENZA VACCINE  06/13/2017  . MAMMOGRAM  02/22/2019  . TETANUS/TDAP  10/13/2020  . COLONOSCOPY  03/27/2027  . Hepatitis C Screening  Completed     Exam:  BP 106/63   Pulse 80   Temp 97.7 F (36.5 C) (Oral)   Wt 174 lb (78.9 kg)   BMI 29.87 kg/m  Gen:  Well NAD HEENT: EOMI,  MMM Lungs: Clear to auscultation bilaterally normal work of breathing Heart: RRR no MRG Abd: NABS, Soft. Nondistended, Nontender No CVA angle tenderness to percussion Exts: Brisk capillary refill, warm and well perfused.    Results for orders placed or performed in visit on 04/23/17 (from the past 72 hour(s))  POCT Urinalysis Dipstick     Status: Abnormal   Collection Time: 04/23/17 11:49 AM  Result Value Ref Range   Color, UA yellow    Clarity, UA clear    Glucose, UA neg    Bilirubin, UA neg    Ketones, UA trace    Spec Grav, UA 1.020 1.010 - 1.025   Blood, UA neg    pH, UA 5.5 5.0 - 8.0   Protein, UA neg    Urobilinogen, UA 0.2  0.2 or 1.0 E.U./dL   Nitrite, UA neg    Leukocytes, UA Moderate (2+) (A) Negative   Dg Chest 2 View  Result Date: 04/23/2017 CLINICAL DATA:  Cough and congestion for several days EXAM: CHEST  2 VIEW COMPARISON:  08/25/2015 FINDINGS: Cardiac shadow is within normal limits. The lungs are hyperinflated. Mild scarring is noted in the bases bilaterally stable from the previous exam. No acute infiltrate or sizable effusion is noted. IMPRESSION: COPD without acute abnormality. Electronically Signed   By: Alcide Clever M.D.   On: 04/23/2017 14:00      Assessment and Plan: 61 y.o. female with 4 day history UTI symptoms and +2 leukocytes on dipstick, culture pending. Prior UTI symptoms successfully treated with cefdinir, may consider changing antibiotics pending culture.  Possibly due to bladder injury, will consider urologist referral if symptoms continue to persist or consider constipation or muscular strain of low back with concurrent urinary symptoms.      Orders Placed This Encounter  Procedures  . Urine Culture  . DG Chest 2 View    Order Specific Question:   Reason for exam:    Answer:   Cough, assess intra-thoracic pathology    Order Specific Question:   Preferred imaging location?    Answer:   [facility]  . POCT Urinalysis Dipstick   Meds ordered this encounter  Medications  . cefdinir (OMNICEF) 300 MG capsule    Sig: Take 1 capsule (300 mg total) by mouth 2 (two) times daily.    Dispense:  14 capsule    Refill:  0     Discussed warning signs or symptoms. Please see discharge instructions. Patient expresses understanding.

## 2017-04-23 NOTE — Patient Instructions (Signed)
Thank you for coming in today. Take omnicef twice daily.  Get xray of the lungs today.  Recheck in 2 weeks.  Let me know if you worsen.   If your belly pain worsens, or you have high fever, bad vomiting, blood in your stool or black tarry stool go to the Emergency Room.

## 2017-04-24 LAB — URINE CULTURE: Organism ID, Bacteria: NO GROWTH

## 2017-05-07 ENCOUNTER — Ambulatory Visit (INDEPENDENT_AMBULATORY_CARE_PROVIDER_SITE_OTHER): Payer: BLUE CROSS/BLUE SHIELD

## 2017-05-07 ENCOUNTER — Ambulatory Visit (INDEPENDENT_AMBULATORY_CARE_PROVIDER_SITE_OTHER): Payer: BLUE CROSS/BLUE SHIELD | Admitting: Family Medicine

## 2017-05-07 ENCOUNTER — Encounter: Payer: Self-pay | Admitting: Family Medicine

## 2017-05-07 VITALS — BP 138/54 | HR 69 | Ht 64.0 in | Wt 174.0 lb

## 2017-05-07 DIAGNOSIS — R109 Unspecified abdominal pain: Secondary | ICD-10-CM

## 2017-05-07 DIAGNOSIS — M5137 Other intervertebral disc degeneration, lumbosacral region: Secondary | ICD-10-CM | POA: Diagnosis not present

## 2017-05-07 DIAGNOSIS — I700 Atherosclerosis of aorta: Secondary | ICD-10-CM | POA: Diagnosis not present

## 2017-05-07 IMAGING — DX DG LUMBAR SPINE COMPLETE 4+V
5 series · 5 of 5 positions shown · non-contrast
Comparison: None.

CLINICAL DATA: Right flank pain for 3 months.  No injury.

EXAM:
LUMBAR SPINE - COMPLETE 4+ VIEW

[l-spine ap]
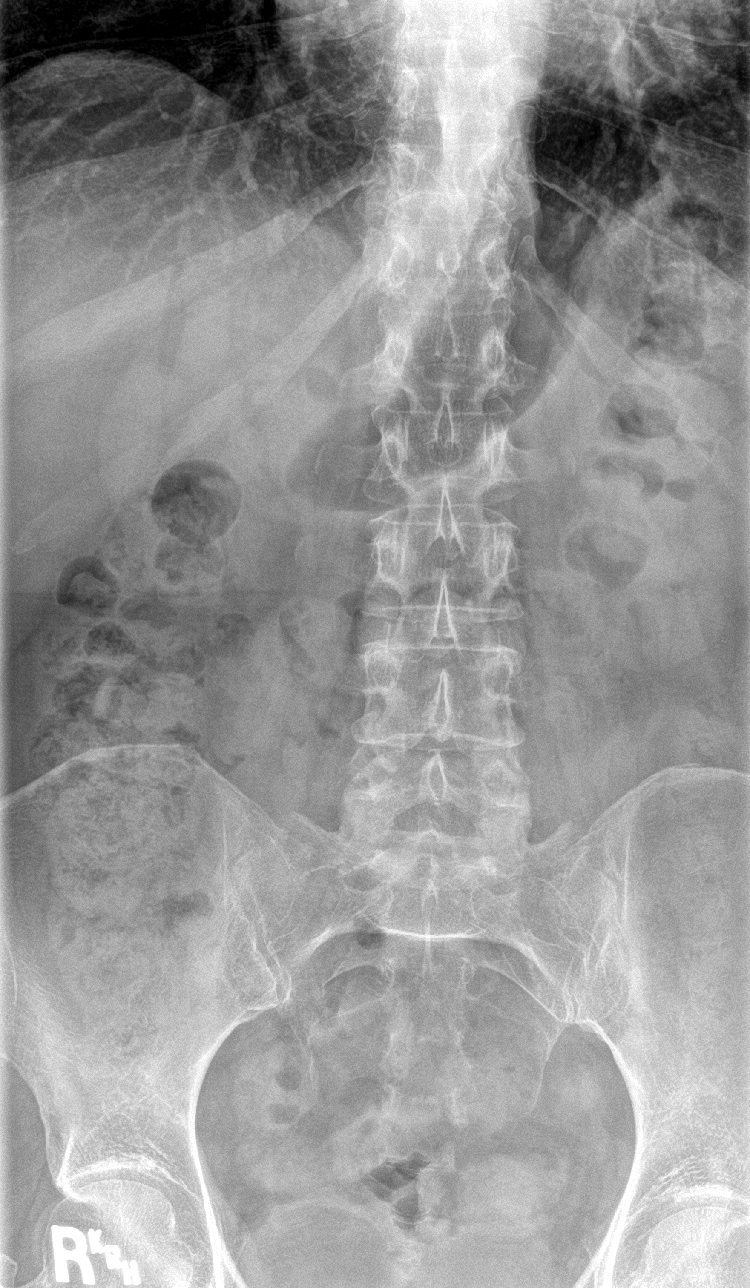

[l-spine obl (1 of 2)]
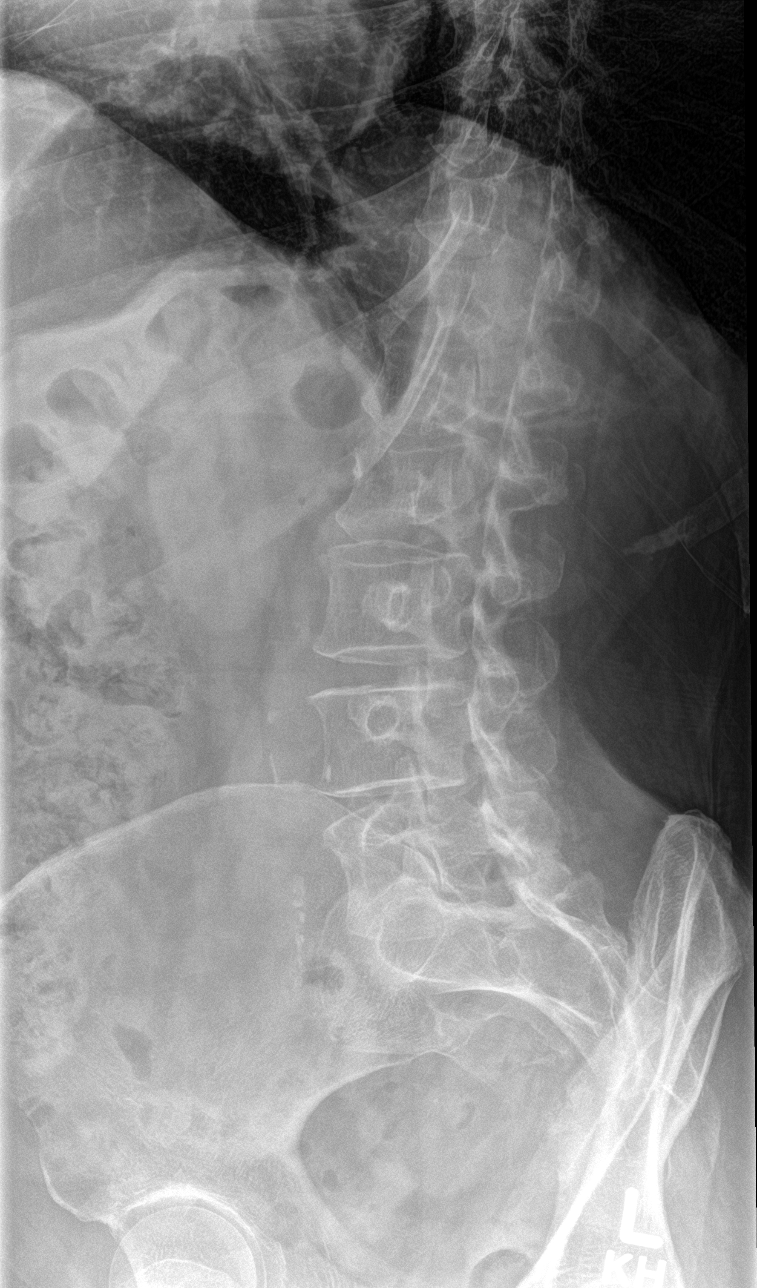

[l-spine obl (2 of 2)]
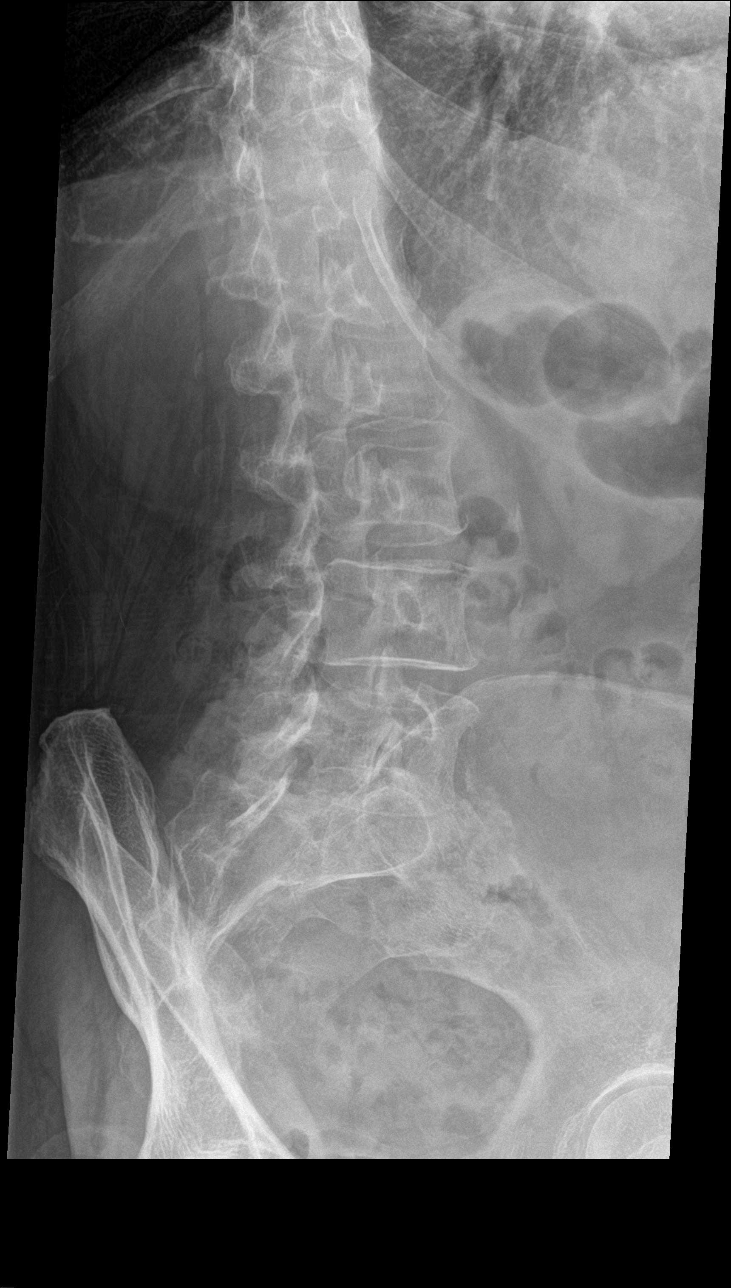

[l-spine lat]
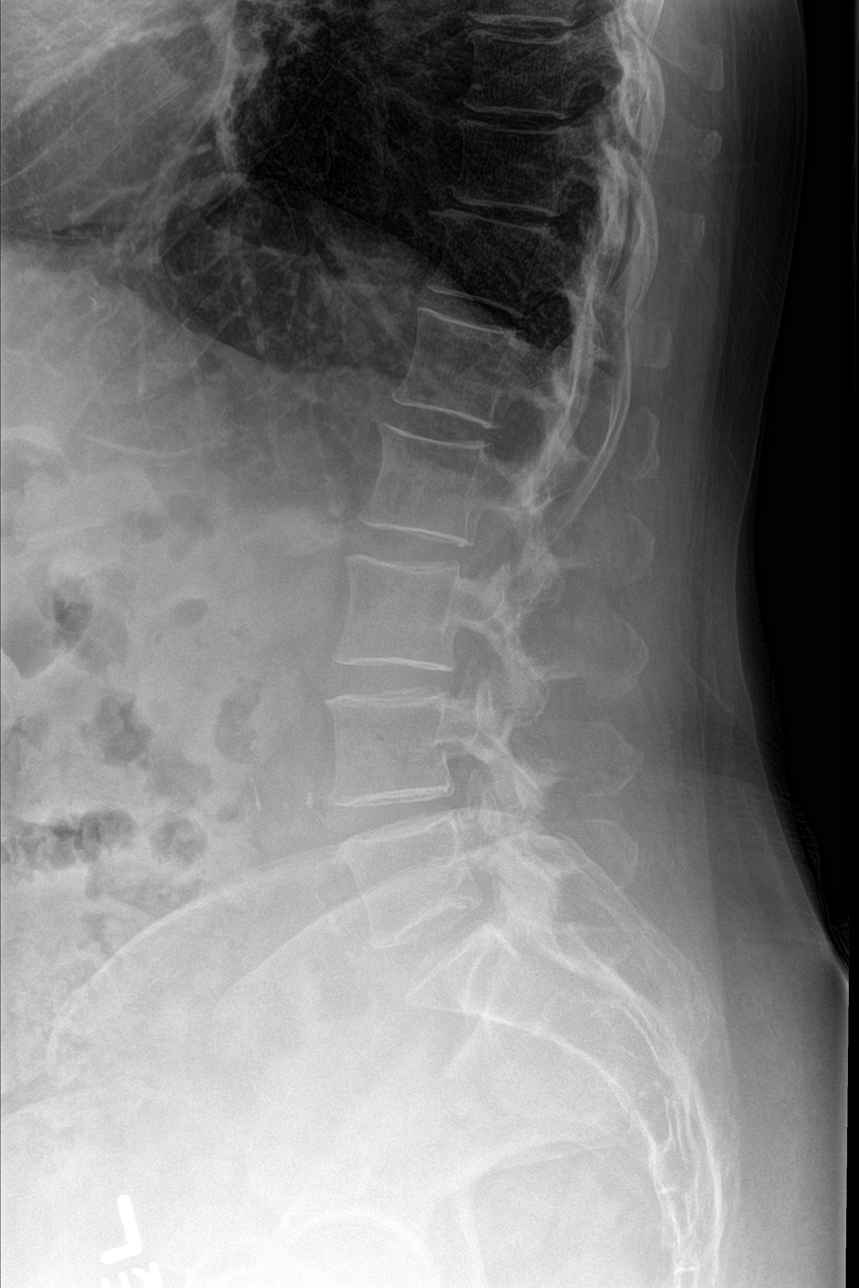

[l-spine spot]
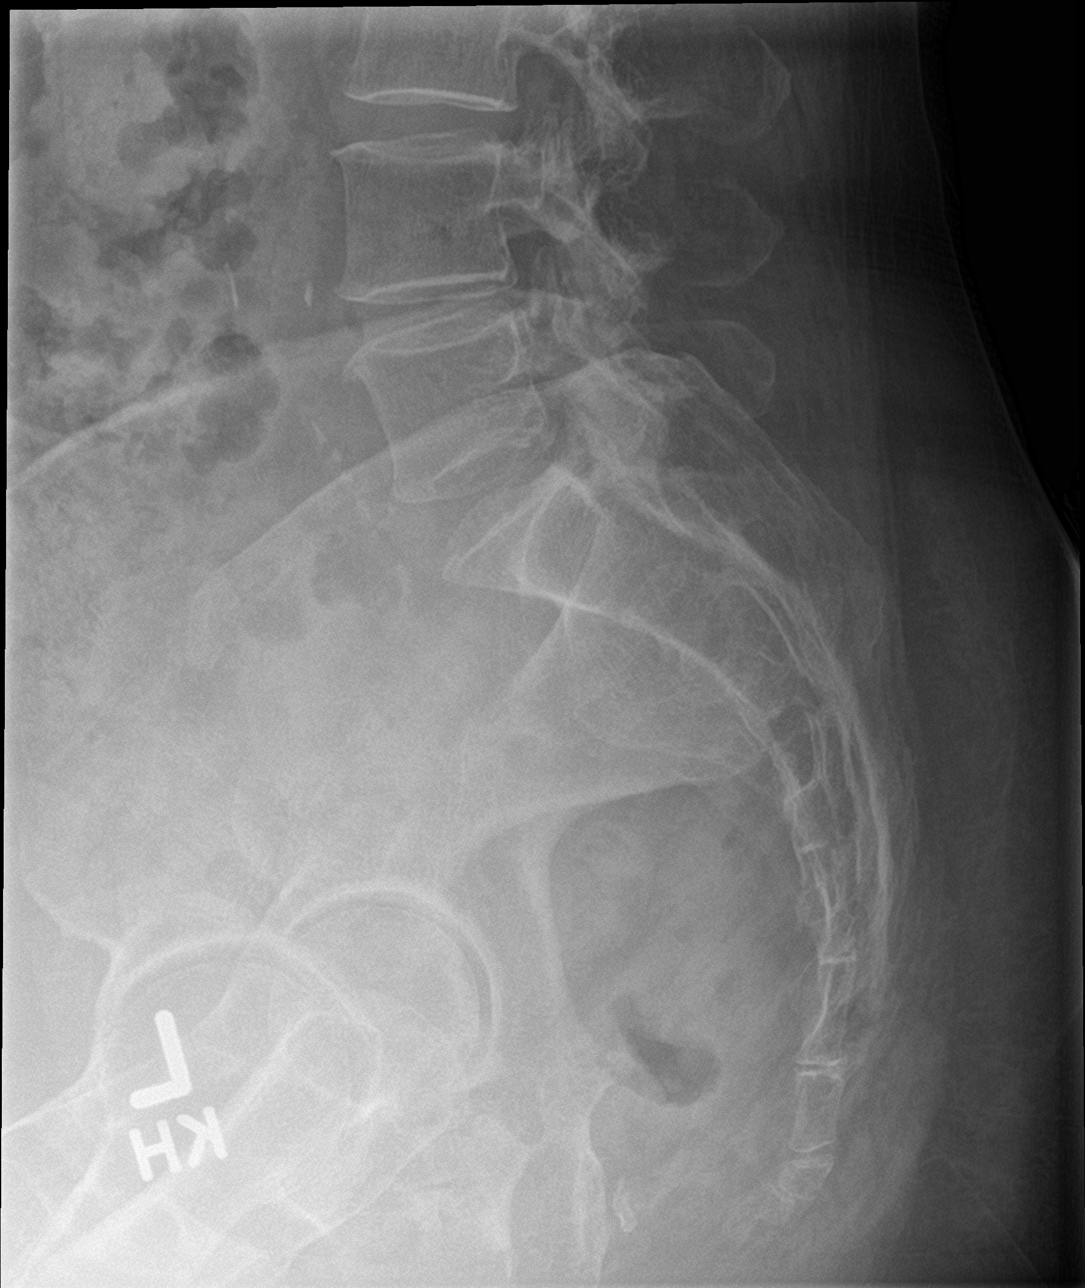

[5 of 5 positions shown; findings below may reference images not displayed]

FINDINGS: Normal alignment. Mild disc space narrowing at L4-L5. Degenerative
facet disease at L5-S1. Negative for a pars defect. Vertebral body
heights are maintained. Atherosclerotic calcifications in the
abdominal aorta. Mild disc space narrowing at T11-T12.
IMPRESSION: Mild degenerative changes.

## 2017-05-07 NOTE — Patient Instructions (Signed)
Thank you for coming in today. I recommend PT.  Work on home exercises.  Use a heating pad and a TENS unit.   TENS UNIT: This is helpful for muscle pain and spasm.   Search and Purchase a TENS 7000 2nd edition at  www.tenspros.com or www.Amazon.com It should be less than $30.     TENS unit instructions: Do not shower or bathe with the unit on Turn the unit off before removing electrodes or batteries If the electrodes lose stickiness add a drop of water to the electrodes after they are disconnected from the unit and place on plastic sheet. If you continued to have difficulty, call the TENS unit company to purchase more electrodes. Do not apply lotion on the skin area prior to use. Make sure the skin is clean and dry as this will help prolong the life of the electrodes. After use, always check skin for unusual red areas, rash or other skin difficulties. If there are any skin problems, does not apply electrodes to the same area. Never remove the electrodes from the unit by pulling the wires. Do not use the TENS unit or electrodes other than as directed. Do not change electrode placement without consultating your therapist or physician. Keep 2 fingers with between each electrode. Wear time ratio is 2:1, on to off times.    For example on for 30 minutes off for 15 minutes and then on for 30 minutes off for 15 minutes      Lumbosacral Strain Lumbosacral strain is an injury that causes pain in the lower back (lumbosacral spine). This injury usually occurs from overstretching the muscles or ligaments along your spine. A strain can affect one or more muscles or cord-like tissues that connect bones to other bones (ligaments). What are the causes? This condition may be caused by:  A hard, direct hit (blow) to the back.  Excessive stretching of the lower back muscles. This may result from: ? A fall. ? Lifting something heavy. ? Repetitive movements such as bending or crouching.  What  increases the risk? The following factors may increase your risk of getting this condition:  Participating in sports or activities that involve: ? A sudden twist of the back. ? Pushing or pulling motions.  Being overweight or obese.  Having poor strength and flexibility, especially tight hamstrings or weak muscles in the back or abdomen.  Having too much of a curve in the lower back.  Having a pelvis that is tilted forward.  What are the signs or symptoms? The main symptom of this condition is pain in the lower back, at the site of the strain. Pain may extend (radiate) down one or both legs. How is this diagnosed? This condition is diagnosed based on:  Your symptoms.  Your medical history.  A physical exam. ? Your health care provider may push on certain areas of your back to determine the source of your pain. ? You may be asked to bend forward, backward, and side to side to assess the severity of your pain and your range of motion.  Imaging tests, such as: ? X-rays. ? MRI.  How is this treated? Treatment for this condition may include:  Putting heat and cold on the affected area.  Medicines to help relieve pain and relax your muscles (muscle relaxants).  NSAIDs to help reduce swelling and discomfort.  When your symptoms improve, it is important to gradually return to your normal routine as soon as possible to reduce pain, avoid stiffness,  and avoid loss of muscle strength. Generally, symptoms should improve within 6 weeks of treatment. However, recovery time varies. Follow these instructions at home: Managing pain, stiffness, and swelling   If directed, put ice on the injured area during the first 24 hours after your strain. ? Put ice in a plastic bag. ? Place a towel between your skin and the bag. ? Leave the ice on for 20 minutes, 2-3 times a day.  If directed, put heat on the affected area as often as told by your health care provider. Use the heat source that  your health care provider recommends, such as a moist heat pack or a heating pad. ? Place a towel between your skin and the heat source. ? Leave the heat on for 20-30 minutes. ? Remove the heat if your skin turns bright red. This is especially important if you are unable to feel pain, heat, or cold. You may have a greater risk of getting burned. Activity  Rest and return to your normal activities as told by your health care provider. Ask your health care provider what activities are safe for you.  Avoid activities that take a lot of energy for as long as told by your health care provider. General instructions  Take over-the-counter and prescription medicines only as told by your health care provider.  Donot drive or use heavy machinery while taking prescription pain medicine.  Do not use any products that contain nicotine or tobacco, such as cigarettes and e-cigarettes. If you need help quitting, ask your health care provider.  Keep all follow-up visits as told by your health care provider. This is important. How is this prevented?  Use correct form when playing sports and lifting heavy objects.  Use good posture when sitting and standing.  Maintain a healthy weight.  Sleep on a mattress with medium firmness to support your back.  Be safe and responsible while being active to avoid falls.  Do at least 150 minutes of moderate-intensity exercise each week, such as brisk walking or water aerobics. Try a form of exercise that takes stress off your back, such as swimming or stationary cycling.  Maintain physical fitness, including: ? Strength. ? Flexibility. ? Cardiovascular fitness. ? Endurance. Contact a health care provider if:  Your back pain does not improve after 6 weeks of treatment.  Your symptoms get worse. Get help right away if:  Your back pain is severe.  You cannot stand or walk.  You have difficulty controlling when you urinate or when you have a bowel  movement.  You feel nauseous or you vomit.  Your feet get very cold.  You have numbness, tingling, weakness, or problems using your arms or legs.  You develop any of the following: ? Shortness of breath. ? Dizziness. ? Pain in your legs. ? Weakness in your buttocks or legs. ? Discoloration of the skin on your toes or legs. This information is not intended to replace advice given to you by your health care provider. Make sure you discuss any questions you have with your health care provider. Document Released: 08/09/2005 Document Revised: 05/19/2016 Document Reviewed: 04/02/2016 Elsevier Interactive Patient Education  2017 [employer].

## 2017-05-07 NOTE — Progress Notes (Signed)
Diana Solis is a 61 y.o. female who [facility] [place]: Primary Care Sports Medicine today for flank pain. Patient presented to clinic today for evaluation of recurrent right flank pain. She notes the pain typically occurs in her right low back and does not typically radiate. She's had multiple evaluations for potential urinary tract infection with negative cultures. She notes the pain is still present intermittently. Pain occasionally is worse with motion and positioning. She denies current urinary frequency urgency or dysuria. Symptoms are moderate to mild and intermittent in nature.   Past Medical History:  Diagnosis Date  . COPD (chronic obstructive pulmonary disease) (HCC)   . DIVERTICULOSIS, COLON 12/10/2010   Qualifier: Diagnosis of  By: Orson AloeHenderson MD, Tinnie GensJeffrey    . GERD (gastroesophageal reflux disease)   . GLAUCOMA 09/29/2009   Qualifier: Diagnosis of  By: Linford ArnoldMetheney MD, Santina Evansatherine    . Hypothyroid   . Urinary incontinence 10/19/2011   Past Surgical History:  Procedure Laterality Date  . BREAST CYST EXCISION    . INCONTINENCE SURGERY    . TONSILLECTOMY    . VAGINAL HYSTERECTOMY     Social History  Substance Use Topics  . Smoking status: Former Smoker    Packs/day: 0.50    Years: 40.00    Types: Cigarettes  . Smokeless tobacco: Current User     Comment: e-cig  . Alcohol use 3.6 oz/week    6 Standard drinks or equivalent per week   family history includes Asthma in her mother; Lung cancer in her father; Lupus in her father; Thyroid disease in her mother.  ROS as above:  Medications: Current Outpatient Prescriptions  Medication Sig Dispense Refill  . albuterol (PROAIR HFA) 108 (90 Base) MCG/ACT inhaler Inhale 2 puffs into the lungs every 6 (six) hours as needed for wheezing or shortness of breath. 18 g 1  . Esomeprazole Magnesium (NEXIUM PO) Take by mouth daily.    .  fluticasone furoate-vilanterol (BREO ELLIPTA) 200-25 MCG/INH AEPB Inhale 1 puff into the lungs daily. 60 each 3  . levothyroxine (SYNTHROID, LEVOTHROID) 88 MCG tablet Take 1 tablet (88 mcg total) by mouth daily. 90 tablet 1  . LUMIGAN 0.01 % SOLN Place 1 drop into both eyes at bedtime.   0  . Multiple Vitamin (MULTIVITAMIN) tablet Take 1 tablet by mouth daily.    . polyethylene glycol powder (GLYCOLAX/MIRALAX) powder Take 17 g by mouth daily. 850 g 12  . Tiotropium Bromide Monohydrate (SPIRIVA RESPIMAT) 1.25 MCG/ACT AERS Inhale 1 puff into the lungs daily. 4 g 12  . valACYclovir (VALTREX) 1000 MG tablet Take 1 tablet (1,000 mg total) by mouth daily. 90 tablet 1  . vitamin C (ASCORBIC ACID) 500 MG tablet Take 500 mg by mouth daily.     No current facility-administered medications for this visit.    No Known Allergies  Health Maintenance Health Maintenance  Topic Date Due  . HIV Screening  03/12/1971  . INFLUENZA VACCINE  06/13/2017  . MAMMOGRAM  02/22/2019  . TETANUS/TDAP  10/13/2020  . COLONOSCOPY  03/27/2027  . Hepatitis C Screening  Completed     Exam:  BP (!) 138/54   Pulse 69   Ht 5\' 4"  (1.626 m)   Wt 174 lb (78.9 kg)   BMI 29.87 kg/m  Gen: Well NAD HEENT: EOMI,  MMM Lungs: Normal work of breathing. CTABL Heart: RRR no MRG Abd: NABS, Soft. Nondistended, Nontender Exts: Brisk capillary refill, warm and well perfused.  L-spine nontender to midline. Mildly tender palpation right lumbar paraspinal muscles. Normal back motion. A summary strength is intact throughout  X-ray L-spine shows degeneration at facet at L5-S1 and at L4-L5. Additionally arteriosclerosis of the abdominal aorta is present on x-ray Await formal radiology review   No results found for this or any previous visit (from the past 72 hour(s)). No results found.    Assessment and Plan: 61 y.o. female with flank pain likely due to myofascial disruption with some DJD. X-ray of L-spine read is pending.  Plan to refer to physical therapy and recheck in a few weeks.  Arteriosclerosis of the abdominal aorta seen on x-ray today. Will continue risk factor optimization and continue to follow.    Orders Placed This Encounter  Procedures  . DG Lumbar Spine Complete    Standing Status:   Future    Number of Occurrences:   1    Standing Expiration Date:   07/07/2018    Order Specific Question:   Reason for Exam (SYMPTOM  OR DIAGNOSIS REQUIRED)    Answer:   eval pain right flank., ?Lspine    Order Specific Question:   Preferred imaging location?    Answer[facility]    Order Specific Question:   Radiology Contrast Protocol - do NOT remove file path    Answer:   [email or web address]  . Ambulatory referral to Physical Therapy    Referral Priority:   Routine    Referral Type:   Physical Medicine    Referral Reason:   Specialty Services Required    Requested Specialty:   Physical Therapy    Number of Visits Requested:   1   No orders of the defined types were placed in this encounter.    Discussed warning signs or symptoms. Please see discharge instructions. Patient expresses understanding.

## 2017-05-17 ENCOUNTER — Ambulatory Visit (INDEPENDENT_AMBULATORY_CARE_PROVIDER_SITE_OTHER): Payer: BLUE CROSS/BLUE SHIELD | Admitting: Rehabilitative and Restorative Service Providers"

## 2017-05-17 DIAGNOSIS — M545 Low back pain, unspecified: Secondary | ICD-10-CM

## 2017-05-17 DIAGNOSIS — R29898 Other symptoms and signs involving the musculoskeletal system: Secondary | ICD-10-CM | POA: Diagnosis not present

## 2017-05-17 NOTE — Therapy (Signed)
[facility] [address] [place], [place], [address] Phone: [phone]   Fax:  [phone]  Physical Therapy Evaluation  Patient Details  Name: Diana Solis MRN: [id number] Date of Birth: [birth year] Referring Provider: Dr Clementeen Graham   Encounter Date: 05/17/2017      PT End of Session - 05/17/17 1112    Visit Number 1   Number of Visits 12   Date for PT Re-Evaluation 06/28/17   PT Start Time 1108   PT Stop Time 1205   PT Time Calculation (min) 57 min   Activity Tolerance Patient tolerated treatment well      Past Medical History:  Diagnosis Date  . COPD (chronic obstructive pulmonary disease) (HCC)   . DIVERTICULOSIS, COLON 12/10/2010   Qualifier: Diagnosis of  By: Orson Aloe MD, Tinnie Gens    . GERD (gastroesophageal reflux disease)   . GLAUCOMA 09/29/2009   Qualifier: Diagnosis of  By: Linford Arnold MD, Santina Evans    . Hypothyroid   . Urinary incontinence 10/19/2011    Past Surgical History:  Procedure Laterality Date  . BREAST CYST EXCISION    . INCONTINENCE SURGERY    . TONSILLECTOMY    . VAGINAL HYSTERECTOMY      There were no vitals filed for this visit.       Subjective Assessment - 05/17/17 1113    Subjective Paitent reports that she is having Rt flank pain which has been present for the past 4-5 months on an intermittent basis. She began to notice symtpoms after she had she had a kidney infection which is now cleared.   How long can you sit comfortably? no limit shifted to Rt side    How long can you stand comfortably? no limit    How long can you walk comfortably? no limit    Diagnostic tests xrays    Patient Stated Goals get rid of the pain    Currently in Pain? Yes   Pain Score 2    Pain Location Thoracic   Pain Orientation Right   Pain Descriptors / Indicators Aching   Pain Type Acute pain   Pain Onset More than a month ago   Pain Frequency Intermittent   Aggravating Factors  unknown   Pain  Relieving Factors unknown             [facility] PT Assessment - 05/17/17 0001      Assessment   Medical Diagnosis Rt flank pain    Referring Provider Dr Clementeen Graham    Onset Date/Surgical Date 12/14/16   Hand Dominance Right   Next MD Visit PRN    Prior Therapy none     Precautions   Precautions None     Balance Screen   Has the patient fallen in the past 6 months No   Has the patient had a decrease in activity level because of a fear of falling?  No   Is the patient reluctant to leave their home because of a fear of falling?  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      Observation/Other Assessments   Focus on Therapeutic Outcomes (FOTO)  26% limitation      Sensation   Additional Comments none per pt report LE's      Posture/Postural Control   Posture Comments sits with Lt LE crossed over the Rt shifted to the Rt elongated  through the Rt side; head forward; shoudlers rounded and elelvated; increased thoraicc kyphosis weight shifted to the LT      AROM   Lumbar Flexion 80%   Lumbar Extension 40%   Lumbar - Right Side Bend 70%  discomfort Rt LB   Lumbar - Left Side Bend 70%  discomfort Rt LB    Lumbar - Right Rotation 25%   Lumbar - Left Rotation 25%     Strength   Overall Strength Comments 5/5 bilat LE's      Flexibility   Hamstrings tight bilat ~80deg   Quadriceps tight end range    ITB WFL's   Piriformis mild tightness Rt      Palpation   Spinal mobility hypomobile lumbar spine w/ discomfort    Palpation comment tight psoas bilat; Rt > Lt QL/lats/lumbar paraspinals             Objective measurements completed on examination: See above findings.          [facility] Adult PT Treatment/Exercise - 05/17/17 0001      Therapeutic Activites    Therapeutic Activities --  education re sitting - avoiding sitting shifted to one side      Lumbar Exercises:  Stretches   Double Knee to Chest Stretch 3 reps;30 seconds   Double Knee to Chest Stretch Limitations added hip abduction with knee to chest 2 reps x 30 sec; supine lat stretch 3 reps x 30 sec    Hip Flexor Stretch 2 reps;30 seconds  seated one LE off edge of table      Moist Heat Therapy   Number Minutes Moist Heat 15 Minutes   Moist Heat Location Lumbar Spine     Electrical Stimulation   Electrical Stimulation Location Rt QL/lats   Electrical Stimulation Action IFC   Electrical Stimulation Parameters to tolerance   Electrical Stimulation Goals Pain;Tone                PT Education - 05/17/17 1140    Education provided Yes   Education Details HEP    Person(s) Educated Patient   Methods Explanation;Demonstration;Tactile cues;Verbal cues;Handout   Comprehension Verbalized understanding;Returned demonstration;Verbal cues required;Tactile cues required             PT Long Term Goals - 05/17/17 1213      PT LONG TERM GOAL #1   Title Decrease muscular tightness to palpation Rt > Lt QL/lats/lumbar paraspinals/psoas 06/28/17   Time 6   Period Weeks   Status New     PT LONG TERM GOAL #2   Title Patient reports 50-75% decrease in Rt flank pain 06/28/17   Time 6   Period Weeks   Status New     PT LONG TERM GOAL #3   Title Independent in HEP 06/28/17   Time 6   Period Weeks   Status New     PT LONG TERM GOAL #4   Title Improve FOTO to </= 20% limitation 06/28/17   Time 6   Period Weeks   Status New                Plan - 05/17/17 1200    Clinical Impression Statement Diana Solis presents with 4-5 month history of Rt flank pain following kidnbey infection. She has poor posture and alignment; decreased lumbar and LE ROM/mobility; tightness to palpation through bilat psoas; Rt > Lt QL/lats/lumbar paraspinals. She will benefit from PT to address problems identified.    Clinical Presentation Stable   Clinical  Decision Making Low   Rehab Potential Good   PT  Frequency 2x / week   PT Duration 6 weeks   PT Treatment/Interventions Patient/family education;ADLs/Self Care Home Management;Cryotherapy;Electrical Stimulation;Iontophoresis 4mg /ml Dexamethasone;Moist Heat;Traction;Ultrasound;Dry needling;Manual techniques;Therapeutic activities;Therapeutic exercise   PT Next Visit Plan review exercises; add cat/cow and sit back, core stabilization, supine hip flexor; deep tissue wor kvs DN Rt > Lt QL/lats/lumbar and psoas; modalities as indicated    Consulted and Agree with Plan of Care Patient      Patient will benefit from skilled therapeutic intervention in order to improve the following deficits and impairments:  Postural dysfunction, Improper body mechanics, Pain, Increased fascial restricitons, Increased muscle spasms, Decreased mobility, Decreased activity tolerance  Visit Diagnosis: Other symptoms and signs involving the musculoskeletal system - Plan: PT plan of care cert/re-cert  Acute right-sided low back pain without sciatica - Plan: PT plan of care cert/re-cert     Problem List Patient Active Problem List   Diagnosis Date Noted  . Arteriosclerosis of abdominal aorta (HCC) 05/07/2017  . Constipation 03/22/2017  . Flank pain 03/22/2017  . Ventral hernia 08/28/2016  . Prediabetes 07/27/2016  . Nonspecific abnormal electrocardiogram (ECG) (EKG) 07/24/2016  . 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 OSA (obstructive sleep apnea) 09/01/2015  . Hypothyroidism 06/01/2015  . Hypersomnia 03/09/2015  . Chronic obstructive pulmonary disease (HCC) 03/23/2014  . Spastic colon 10/19/2011  . Urinary incontinence 10/19/2011  . DIVERTICULOSIS, COLON 12/10/2010  . OTHER SPECIFIED DISORDERS OF THYROID 10/13/2010  . GLAUCOMA 09/29/2009  . Asymptomatic postmenopausal status 09/29/2009    Diana Solis Rober Minion PT, MPH  05/17/2017, 12:21 PM  [facility] [address] [place], [place], [address] Phone:  [phone]   Fax:  [phone]  Name: Diana Solis MRN: [id number] Date of Birth: 09/23/1956

## 2017-05-17 NOTE — Patient Instructions (Signed)
Double Knee to Chest (Flexion)    Gently pull both knees toward chest. Feel stretch in lower back or buttock area. Breathing deeply, Hold _20-30___ seconds. Repeat __3__ times. Do _2-3___ sessions per day.   Bring knees to chest as above - spread knees apart and pull knees to chest  Repeat above   As above - keep knees tight and turn palms toward face reaching over your head  Hold 30 sec repeat 3 times    TENS UNIT: This is helpful for muscle pain and spasm.   Search and Purchase a TENS 7000 2nd edition at www.tenspros.com. It should be less than $30.     TENS unit instructions: Do not shower or bathe with the unit on Turn the unit off before removing electrodes or batteries If the electrodes lose stickiness add a drop of water to the electrodes after they are disconnected from the unit and place on plastic sheet. If you continued to have difficulty, call the TENS unit company to purchase more electrodes. Do not apply lotion on the skin area prior to use. Make sure the skin is clean and dry as this will help prolong the life of the electrodes. After use, always check skin for unusual red areas, rash or other skin difficulties. If there are any skin problems, does not apply electrodes to the same area. Never remove the electrodes from the unit by pulling the wires. Do not use the TENS unit or electrodes other than as directed. Do not change electrode placement without consultating your therapist or physician. Keep 2 fingers with between each electrode.   Trigger Point Dry Needling  . What is Trigger Point Dry Needling (DN)? o DN is a physical therapy technique used to treat muscle pain and dysfunction. Specifically, DN helps deactivate muscle trigger points (muscle knots).  o A thin filiform needle is used to penetrate the skin and stimulate the underlying trigger point. The goal is for a local twitch response (LTR) to occur and for the trigger point to relax. No medication of  any kind is injected during the procedure.   . What Does Trigger Point Dry Needling Feel Like?  o The procedure feels different for each individual patient. Some patients report that they do not actually feel the needle enter the skin and overall the process is not painful. Very mild bleeding may occur. However, many patients feel a deep cramping in the muscle in which the needle was inserted. This is the local twitch response.   Marland Kitchen. How Will I feel after the treatment? o Soreness is normal, and the onset of soreness may not occur for a few hours. Typically this soreness does not last longer than two days.  o Bruising is uncommon, however; ice can be used to decrease any possible bruising.  o In rare cases feeling tired or nauseous after the treatment is normal. In addition, your symptoms may get worse before they get better, this period will typically not last longer than 24 hours.   . What Can I do After My Treatment? o Increase your hydration by drinking more water for the next 24 hours. o You may place ice or heat on the areas treated that have become sore, however, do not use heat on inflamed or bruised areas. Heat often brings more relief post needling. o You can continue your regular activities, but vigorous activity is not recommended initially after the treatment for 24 hours. o DN is best combined with other physical therapy such as strengthening,  stretching, and other therapies.

## 2017-05-24 ENCOUNTER — Encounter: Payer: BLUE CROSS/BLUE SHIELD | Admitting: Rehabilitative and Restorative Service Providers"

## 2017-06-01 ENCOUNTER — Encounter: Payer: Self-pay | Admitting: Rehabilitative and Restorative Service Providers"

## 2017-06-01 ENCOUNTER — Ambulatory Visit (INDEPENDENT_AMBULATORY_CARE_PROVIDER_SITE_OTHER): Payer: BLUE CROSS/BLUE SHIELD | Admitting: Rehabilitative and Restorative Service Providers"

## 2017-06-01 DIAGNOSIS — R29898 Other symptoms and signs involving the musculoskeletal system: Secondary | ICD-10-CM

## 2017-06-01 DIAGNOSIS — M545 Low back pain, unspecified: Secondary | ICD-10-CM

## 2017-06-01 NOTE — Therapy (Addendum)
[facility] [address] [place], [place], [address] Phone: [phone]   Fax:  [phone]  Physical Therapy Treatment  Patient Details  Name: Pietrina Jagodzinski MRN: [id number] Date of Birth: 1956-03-07 Referring Provider: Dr Lynne Leader   Encounter Date: 06/01/2017      PT End of Session - 06/01/17 0845    Visit Number 2   Number of Visits 12   Date for PT Re-Evaluation 06/28/17   PT Start Time 2536   PT Stop Time 0928   PT Time Calculation (min) 41 min   Activity Tolerance Patient tolerated treatment well      Past Medical History:  Diagnosis Date  . COPD (chronic obstructive pulmonary disease) ([place])   . DIVERTICULOSIS, COLON 12/10/2010   Qualifier: Diagnosis of  By: Koleen Nimrod MD, Dellis Filbert    . GERD (gastroesophageal reflux disease)   . GLAUCOMA 09/29/2009   Qualifier: Diagnosis of  By: Madilyn Fireman MD, Barnetta Chapel    . Hypothyroid   . Urinary incontinence 10/19/2011    Past Surgical History:  Procedure Laterality Date  . BREAST CYST EXCISION    . INCONTINENCE SURGERY    . TONSILLECTOMY    . VAGINAL HYSTERECTOMY      There were no vitals filed for this visit.      Subjective Assessment - 06/01/17 0849    Subjective Rt side is feeling a little better. She is working on her exercises at home.    Currently in Pain? No/denies            [facility] PT Assessment - 06/01/17 0001      Assessment   Medical Diagnosis Rt flank pain    Referring Provider Dr Lynne Leader    Onset Date/Surgical Date 12/14/16   Hand Dominance Right   Next MD Visit PRN    Prior Therapy none     AROM   Overall AROM  --  no discomfort or pain with trunk ROM    Lumbar Flexion 80%   Lumbar Extension 40%   Lumbar - Right Side Bend 75%   Lumbar - Left Side Bend 75%   Lumbar - Right Rotation 35%   Lumbar - Left Rotation 35%     Palpation   Palpation comment tight psoas bilat; Rt > Lt QL/lats/lumbar paraspinals                       [facility] Adult PT Treatment/Exercise - 06/01/17 0001      Therapeutic Activites    Therapeutic Activities --  education re sitting - avoiding sitting shifted to one side      Lumbar Exercises: Stretches   Double Knee to Chest Stretch 3 reps;30 seconds   Double Knee to Chest Stretch Limitations added hip abduction with knee to chest 2 reps x 30 sec; supine lat stretch 3 reps x 30 sec    Hip Flexor Stretch 2 reps;30 seconds  seated one LE off edge of table      Lumbar Exercises: Aerobic   Stationary Bike Nu step L4 x 3 min      Lumbar Exercises: Seated   Sit to Stand Limitations lateral trunk flexion to the Lt reaching over Rt hand 20 sec x 3      Lumbar Exercises: Quadruped   Madcat/Old Horse 5 reps   Madcat/Old Horse Limitations sit back 20 sec x 2      Moist Heat Therapy   Number Minutes Moist Heat 20 Minutes  Moist Heat Location Lumbar Spine     Electrical Stimulation   Electrical Stimulation Location Rt QL/lats   Electrical Stimulation Action IFC   Electrical Stimulation Parameters to tolerance   Electrical Stimulation Goals Pain;Tone     Manual Therapy   Manual therapy comments pt prone    Soft tissue mobilization deep tissue work through [employer] QL/lats/lumbar paraspinals                 PT Education - 06/01/17 0906    Education provided Yes   Education Details HEP    Person(s) Educated Patient   Methods Explanation;Demonstration;Tactile cues;Verbal cues;Handout   Comprehension Verbalized understanding;Returned demonstration;Verbal cues required;Tactile cues required             PT Long Term Goals - 06/01/17 0845      PT LONG TERM GOAL #1   Title Decrease muscular tightness to palpation Rt > Lt QL/lats/lumbar paraspinals/psoas 06/28/17   Time 6   Period Weeks   Status On-going     PT LONG TERM GOAL #2   Title Patient reports 50-75% decrease in Rt flank pain 06/28/17   Time 6   Period Weeks   Status On-going     PT  LONG TERM GOAL #3   Title Independent in HEP 06/28/17   Time 6   Period Weeks   Status On-going     PT LONG TERM GOAL #4   Title Improve FOTO to </= 20% limitation 06/28/17   Time 6   Period Weeks   Status On-going               Plan - 06/01/17 0929    Clinical Impression Statement Good improvement in symptoms - less pain and tightness. Patient demonstrates increased trunk mobilty with decreased muscular tightness. She is please with her progress and would like to continue with independent HEP. She will call in the next two weeks if she needs to return to PT.    Rehab Potential Good   PT Frequency 2x / week   PT Duration 6 weeks   PT Treatment/Interventions Patient/family education;ADLs/Self Care Home Management;Cryotherapy;Electrical Stimulation;Iontophoresis 75m/ml Dexamethasone;Moist Heat;Traction;Ultrasound;Dry needling;Manual techniques;Therapeutic activities;Therapeutic exercise   PT Next Visit Plan deep tissue work vs DN Rt > Lt QL/lats/lumbar and psoas; modalities as indicated  Pt will call to schedule as needed    Consulted and Agree with Plan of Care Patient      Patient will benefit from skilled therapeutic intervention in order to improve the following deficits and impairments:  Postural dysfunction, Improper body mechanics, Pain, Increased fascial restricitons, Increased muscle spasms, Decreased mobility, Decreased activity tolerance  Visit Diagnosis: Other symptoms and signs involving the musculoskeletal system  Acute right-sided low back pain without sciatica     Problem List Patient Active Problem List   Diagnosis Date Noted  . Ar[place] of abdominal aorta ([place] 05/07/2017  . Constipation 03/22/2017  . Flank pain 03/22/2017  . Ventral hernia 08/28/2016  . Prediabetes 07/27/2016  . Nonspecific abnormal electrocardiogram (ECG) (EKG) 07/24/2016  . 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 OSA (obstructive sleep apnea) 09/01/2015  . Hypothyroidism  06/01/2015  . Hypersomnia 03/09/2015  . Chronic obstructive pulmonary disease ([place] 03/23/2014  . Spastic colon 10/19/2011  . Urinary incontinence 10/19/2011  . DIVERTICULOSIS, COLON 12/10/2010  . OTHER SPECIFIED DISORDERS OF THYROID 10/13/2010  . GLAUCOMA 09/29/2009  . Asymptomatic postmenopausal status 09/29/2009    Symphony Demuro PNilda SimmerPT, MPH  06/01/2017, 9:33 AM  [place]  [place], [place], [address] Phone: [phone]   Fax:  [phone]  Name: Iyannah Blake MRN: [id number] Date of Birth: Apr 29, 1956  PHYSICAL THERAPY DISCHARGE SUMMARY  Visits from Start of Care: 2  Current functional level related to goals / functional outcomes: See progress note for discharge status   Remaining deficits: See note   Education / Equipment: HEP Plan: Patient agrees to discharge.  Patient goals were partially met. Patient is being discharged due to being pleased with the current functional level.  ?????     Ardyth Kelso P. Helene Kelp PT, MPH 06/27/17 4:55 PM

## 2017-06-01 NOTE — Patient Instructions (Addendum)
Cat / Cow Flow    Inhale, press spine toward ceiling like a Halloween cat. Keeping strength in arms and abdominals, exhale to soften spine through neutral and into cow pose. Open chest and arch back. Initiate movement between cat and cow at tailbone, one vertebrae at a time. Repeat __5__ times.    Side Waist Stretch from Child's Pose    From child's pose, walk hands to left. Reach right hand out on diagonal. Reach hips back toward heels Hold for __20__ breaths. Repeat __3__ times each side.    Side Stretch, Standing Bend    Sitting bring right arm straight over head. Place other hand on hip and bend to that side as far as is comfortable. Hold __20_ seconds. Repeat __3_ times per session. Do _2-3__ sessions per day.

## 2017-06-11 ENCOUNTER — Other Ambulatory Visit: Payer: Self-pay

## 2017-06-11 MED ORDER — LEVOTHYROXINE SODIUM 88 MCG PO TABS: 88.0000 ug | ORAL_TABLET | Freq: Every day | ORAL | 3 refills | Status: DC

## 2017-08-09 ENCOUNTER — Telehealth: Payer: Self-pay

## 2017-08-09 NOTE — Telephone Encounter (Signed)
Renee from [place], [place] called as a courtesy, and wants you to know the patient enrolled in the case management program with them.  If you have any questions she can be reached at [phone], [phone].

## 2017-08-15 ENCOUNTER — Telehealth: Payer: Self-pay

## 2017-08-15 ENCOUNTER — Ambulatory Visit (INDEPENDENT_AMBULATORY_CARE_PROVIDER_SITE_OTHER): Payer: BLUE CROSS/BLUE SHIELD | Admitting: Family Medicine

## 2017-08-15 DIAGNOSIS — Z23 Encounter for immunization: Secondary | ICD-10-CM | POA: Diagnosis not present

## 2017-08-15 NOTE — Telephone Encounter (Signed)
Pt is requesting for a refill on albuterol.  Is it ok to send refills?  Please advise.

## 2017-08-16 NOTE — Telephone Encounter (Signed)
Yep go for it

## 2017-08-17 ENCOUNTER — Other Ambulatory Visit: Payer: Self-pay

## 2017-08-17 DIAGNOSIS — J439 Emphysema, unspecified: Secondary | ICD-10-CM

## 2017-08-17 MED ORDER — ALBUTEROL SULFATE HFA 108 (90 BASE) MCG/ACT IN AERS: 2.0000 | INHALATION_SPRAY | Freq: Four times a day (QID) | RESPIRATORY_TRACT | 1 refills | Status: DC | PRN

## 2017-08-21 ENCOUNTER — Other Ambulatory Visit: Payer: Self-pay | Admitting: Family Medicine

## 2017-08-21 DIAGNOSIS — J439 Emphysema, unspecified: Secondary | ICD-10-CM

## 2017-08-21 NOTE — Telephone Encounter (Signed)
Rx was sent  

## 2017-12-18 ENCOUNTER — Ambulatory Visit (INDEPENDENT_AMBULATORY_CARE_PROVIDER_SITE_OTHER): Payer: BLUE CROSS/BLUE SHIELD | Admitting: Family Medicine

## 2017-12-18 ENCOUNTER — Encounter: Payer: Self-pay | Admitting: Family Medicine

## 2017-12-18 VITALS — BP 130/65 | HR 62 | Ht 64.0 in | Wt 178.0 lb

## 2017-12-18 DIAGNOSIS — E039 Hypothyroidism, unspecified: Secondary | ICD-10-CM

## 2017-12-18 DIAGNOSIS — F329 Major depressive disorder, single episode, unspecified: Secondary | ICD-10-CM | POA: Insufficient documentation

## 2017-12-18 DIAGNOSIS — R7303 Prediabetes: Secondary | ICD-10-CM | POA: Diagnosis not present

## 2017-12-18 DIAGNOSIS — R5383 Other fatigue: Secondary | ICD-10-CM | POA: Insufficient documentation

## 2017-12-18 DIAGNOSIS — G4733 Obstructive sleep apnea (adult) (pediatric): Secondary | ICD-10-CM | POA: Diagnosis not present

## 2017-12-18 DIAGNOSIS — Z114 Encounter for screening for human immunodeficiency virus [HIV]: Secondary | ICD-10-CM | POA: Diagnosis not present

## 2017-12-18 DIAGNOSIS — I700 Atherosclerosis of aorta: Secondary | ICD-10-CM

## 2017-12-18 DIAGNOSIS — F321 Major depressive disorder, single episode, moderate: Secondary | ICD-10-CM

## 2017-12-18 DIAGNOSIS — J439 Emphysema, unspecified: Secondary | ICD-10-CM | POA: Diagnosis not present

## 2017-12-18 DIAGNOSIS — Z0000 Encounter for general adult medical examination without abnormal findings: Secondary | ICD-10-CM | POA: Diagnosis not present

## 2017-12-18 NOTE — Progress Notes (Signed)
Diana Solis is a 62 y.o. female who presents to [facility] Medicine today for well adult visit.   Diana Solis presents to clinic for well adult visit. She is feeling well overall for the most part. She does however note fatigue and depression symptoms. She notes that her mother died about 1 year ago and she remains depressed. She has trouble sleeping and feels irritable and tired.   Otherwise she feels well with no fevers or chills vomiting or diarrhea.  She takes medications listed below.   Past Medical History:  Diagnosis Date  . COPD (chronic obstructive pulmonary disease) (HCC)   . DIVERTICULOSIS, COLON 12/10/2010   Qualifier: Diagnosis of  By: Orson Aloe MD, Tinnie Gens    . GERD (gastroesophageal reflux disease)   . GLAUCOMA 09/29/2009   Qualifier: Diagnosis of  By: Linford Arnold MD, Santina Evans    . Hypothyroid   . Urinary incontinence 10/19/2011   Past Surgical History:  Procedure Laterality Date  . BREAST CYST EXCISION    . INCONTINENCE SURGERY    . TONSILLECTOMY    . VAGINAL HYSTERECTOMY     Social History   Tobacco Use  . Smoking status: Former Smoker    Packs/day: 0.50    Years: 40.00    Pack years: 20.00    Types: Cigarettes  . Smokeless tobacco: Current User  . Tobacco comment: e-cig  Substance Use Topics  . Alcohol use: Yes    Alcohol/week: 3.6 oz    Types: 6 Standard drinks or equivalent per week   family history includes Asthma in her mother; Lung cancer in her father; Lupus in her father; Thyroid disease in her mother.  ROS as above:  Medications: Current Outpatient Medications  Medication Sig Dispense Refill  . albuterol (PROAIR HFA) 108 (90 Base) MCG/ACT inhaler Inhale 2 puffs into the lungs every 6 (six) hours as needed for wheezing or shortness of breath. 18 g 1  . BREO ELLIPTA 200-25 MCG/INH AEPB inhale 1 puff by mouth daily 60 each 3  .  Esomeprazole Magnesium (NEXIUM PO) Take by mouth daily.    Marland Kitchen levothyroxine (SYNTHROID, LEVOTHROID) 88 MCG tablet Take 1 tablet (88 mcg total) by mouth daily. 90 tablet 3  . LUMIGAN 0.01 % SOLN Place 1 drop into both eyes at bedtime.   0  . Multiple Vitamin (MULTIVITAMIN) tablet Take 1 tablet by mouth daily.    . polyethylene glycol powder (GLYCOLAX/MIRALAX) powder Take 17 g by mouth daily. 850 g 12  . Tiotropium Bromide Monohydrate (SPIRIVA RESPIMAT) 1.25 MCG/ACT AERS Inhale 1 puff into the lungs daily. 4 g 12  . valACYclovir (VALTREX) 1000 MG tablet Take 1 tablet (1,000 mg total) by mouth daily. 90 tablet 1  . vitamin C (ASCORBIC ACID) 500 MG tablet Take 500 mg by mouth daily.     No current facility-administered medications for this visit.    No Known Allergies  Health Maintenance Health Maintenance  Topic Date Due  . HIV Screening  03/12/1971  . MAMMOGRAM  02/22/2019  . TETANUS/TDAP  10/13/2020  . COLONOSCOPY  03/27/2027  . INFLUENZA VACCINE  Completed  . Hepatitis C Screening  Completed     Exam:  BP 130/65   Pulse 62   Ht 5\' 4"  (1.626 m)   Wt 178 lb (80.7 kg)   BMI 30.55 kg/m  Gen: Well NAD HEENT: EOMI,  MMM Lungs: Normal work of breathing. CTABL Heart: RRR no MRG Abd: NABS, Soft. Nondistended,  Nontender Exts: Brisk capillary refill, warm and well perfused.  Psych: alert and oriented. Tearful affect. Normal speech and through process. No SI or HI expressed.   Depression screen PHQ 2/9 12/18/2017  Decreased Interest 2  Down, Depressed, Hopeless 1  PHQ - 2 Score 3  Altered sleeping 2  Tired, decreased energy 3  Change in appetite 0  Feeling bad or failure about yourself  0  Trouble concentrating 0  Suicidal thoughts 0  PHQ-9 Score 8  Difficult doing work/chores Somewhat difficult      No results found for this or any previous visit (from the past 72 hour(s)). No results found.    Assessment and Plan: 62 y.o. female with well adult.  Screening up-to-date.   Recheck basic fasting labs in the near future.  Depression not well controlled.  Patient wants to try counseling before medications.   Orders Placed This Encounter  Procedures  . CBC  . COMPLETE METABOLIC PANEL WITH GFR  . Lipid Panel w/reflex Direct LDL  . TSH  . Hemoglobin A1c  . Vitamin B12  . HIV antibody  . Ambulatory referral to Behavioral Health    Referral Priority:   Routine    Referral Type:   Psychiatric    Referral Reason:   Specialty Services Required    Requested Specialty:   Behavioral Health    Number of Visits Requested:   1   No orders of the defined types were placed in this encounter.    Discussed warning signs or symptoms. Please see discharge instructions. Patient expresses understanding.

## 2017-12-18 NOTE — Patient Instructions (Addendum)
Thank you for coming in today. We will check labs today to look for thyroid, anemia and cholesterol.  Attend therapy.  Let me know if you do not hear from them.  Consider Effexor for depression and hot flashes.  STOP flonase for 1-2 months.  Continue zyrtec Use vasoline on a qtip for a few weeks.  Use a humidifier.     Venlafaxine extended-release capsules What is this medicine? VENLAFAXINE(VEN la fax een) is used to treat depression, anxiety and panic disorder. This medicine may be used for other purposes; ask your health care provider or pharmacist if you have questions. COMMON BRAND NAME(S): Effexor XR What should I tell my health care provider before I take this medicine? They need to know if you have any of these conditions: -bleeding disorders -glaucoma -heart disease -high blood pressure -high cholesterol -kidney disease -liver disease -low levels of sodium in the blood -mania or bipolar disorder -seizures -suicidal thoughts, plans, or attempt; a previous suicide attempt by you or a family -take medicines that treat or prevent blood clots -thyroid disease -an unusual or allergic reaction to venlafaxine, desvenlafaxine, other medicines, foods, dyes, or preservatives -pregnant or trying to get pregnant -breast-feeding How should I use this medicine? Take this medicine by mouth with a full glass of water. Follow the directions on the prescription label. Do not cut, crush, or chew this medicine. Take it with food. If needed, the capsule may be carefully opened and the entire contents sprinkled on a spoonful of cool applesauce. Swallow the applesauce/pellet mixture right away without chewing and follow with a glass of water to ensure complete swallowing of the pellets. Try to take your medicine at about the same time each day. Do not take your medicine more often than directed. Do not stop taking this medicine suddenly except upon the advice of your doctor. Stopping this  medicine too quickly may cause serious side effects or your condition may worsen. A special MedGuide will be given to you by the pharmacist with each prescription and refill. Be sure to read this information carefully each time. Talk to your pediatrician regarding the use of this medicine in children. Special care may be needed. Overdosage: If you think you have taken too much of this medicine contact a poison control center or emergency room at once. NOTE: This medicine is only for you. Do not share this medicine with others. What if I miss a dose? If you miss a dose, take it as soon as you can. If it is almost time for your next dose, take only that dose. Do not take double or extra doses. What may interact with this medicine? Do not take this medicine with any of the following medications: -certain medicines for fungal infections like fluconazole, itraconazole, ketoconazole, posaconazole, voriconazole -cisapride -desvenlafaxine -dofetilide -dronedarone -duloxetine -levomilnacipran -linezolid -MAOIs like Carbex, Eldepryl, Marplan, Nardil, and Parnate -methylene blue (injected into a vein) -milnacipran -pimozide -thioridazine -ziprasidone This medicine may also interact with the following medications: -amphetamines -aspirin and aspirin-like medicines -certain medicines for depression, anxiety, or psychotic disturbances -certain medicines for migraine headaches like almotriptan, eletriptan, frovatriptan, naratriptan, rizatriptan, sumatriptan, zolmitriptan -certain medicines for sleep -certain medicines that treat or prevent blood clots like dalteparin, enoxaparin, warfarin -cimetidine -clozapine -diuretics -fentanyl -furazolidone -indinavir -isoniazid -lithium -metoprolol -NSAIDS, medicines for pain and inflammation, like ibuprofen or naproxen -other medicines that prolong the QT interval (cause an abnormal heart rhythm) -procarbazine -rasagiline -supplements like St.  John's wort, kava kava, valerian -tramadol -tryptophan This list may  not describe all possible interactions. Give your health care provider a list of all the medicines, herbs, non-prescription drugs, or dietary supplements you use. Also tell them if you smoke, drink alcohol, or use illegal drugs. Some items may interact with your medicine. What should I watch for while using this medicine? Tell your doctor if your symptoms do not get better or if they get worse. Visit your doctor or health care professional for regular checks on your progress. Because it may take several weeks to see the full effects of this medicine, it is important to continue your treatment as prescribed by your doctor. Patients and their families should watch out for new or worsening thoughts of suicide or depression. Also watch out for sudden changes in feelings such as feeling anxious, agitated, panicky, irritable, hostile, aggressive, impulsive, severely restless, overly excited and hyperactive, or not being able to sleep. If this happens, especially at the beginning of treatment or after a change in dose, call your health care professional. This medicine can cause an increase in blood pressure. Check with your doctor for instructions on monitoring your blood pressure while taking this medicine. You may get drowsy or dizzy. Do not drive, use machinery, or do anything that needs mental alertness until you know how this medicine affects you. Do not stand or sit up quickly, especially if you are an older patient. This reduces the risk of dizzy or fainting spells. Alcohol may interfere with the effect of this medicine. Avoid alcoholic drinks. Your mouth may get dry. Chewing sugarless gum, sucking hard candy and drinking plenty of water will help. Contact your doctor if the problem does not go away or is severe. What side effects may I notice from receiving this medicine? Side effects that you should report to your doctor or health care  professional as soon as possible: -allergic reactions like skin rash, itching or hives, swelling of the face, lips, or tongue -anxious -breathing problems -confusion -changes in vision -chest pain -confusion -elevated mood, decreased need for sleep, racing thoughts, impulsive behavior -eye pain -fast, irregular heartbeat -feeling faint or lightheaded, falls -feeling agitated, angry, or irritable -hallucination, loss of contact with reality -high blood pressure -loss of balance or coordination -palpitations -redness, blistering, peeling or loosening of the skin, including inside the mouth -restlessness, pacing, inability to keep still -seizures -stiff muscles -suicidal thoughts or other mood changes -trouble passing urine or change in the amount of urine -trouble sleeping -unusual bleeding or bruising -unusually weak or tired -vomiting Side effects that usually do not require medical attention (report to your doctor or health care professional if they continue or are bothersome): -change in sex drive or performance -change in appetite or weight -constipation -dizziness -dry mouth -headache -increased sweating -nausea -tired This list may not describe all possible side effects. Call your doctor for medical advice about side effects. You may report side effects to FDA at 1-800-FDA-1088. Where should I keep my medicine? Keep out of the reach of children. Store at a controlled temperature between 20 and 25 degrees C (68 degrees and 77 degrees F), in a dry place. Throw away any unused medicine after the expiration date. NOTE: This sheet is a summary. It may not cover all possible information. If you have questions about this medicine, talk to your doctor, pharmacist, or health care provider.  2018 Elsevier/Gold Standard (2016-03-30 18:38:02)

## 2017-12-19 ENCOUNTER — Other Ambulatory Visit: Payer: Self-pay | Admitting: Family Medicine

## 2017-12-19 LAB — LIPID PANEL W/REFLEX DIRECT LDL
Cholesterol: 248 mg/dL — ABNORMAL HIGH (ref <200)
HDL: 76 mg/dL (ref >50)
LDL Cholesterol (Calc): 150 mg/dL (calc) — ABNORMAL HIGH
Non-HDL Cholesterol (Calc): 172 mg/dL (calc) — ABNORMAL HIGH (ref <130)
Total CHOL/HDL Ratio: 3.3 (calc) (ref <5.0)
Triglycerides: 102 mg/dL (ref <150)

## 2017-12-19 LAB — CBC
HCT: 40.7 % (ref 35.0–45.0)
Hemoglobin: 13.9 g/dL (ref 11.7–15.5)
MCH: 31 pg (ref 27.0–33.0)
MCHC: 34.2 g/dL (ref 32.0–36.0)
MCV: 90.6 fL (ref 80.0–100.0)
MPV: 9.8 fL (ref 7.5–12.5)
Platelets: 249 10*3/uL (ref 140–400)
RBC: 4.49 10*6/uL (ref 3.80–5.10)
RDW: 12.6 % (ref 11.0–15.0)
WBC: 4.3 10*3/uL (ref 3.8–10.8)

## 2017-12-19 LAB — COMPLETE METABOLIC PANEL WITH GFR
AG Ratio: 1.9 (calc) (ref 1.0–2.5)
ALT: 16 U/L (ref 6–29)
AST: 17 U/L (ref 10–35)
Albumin: 4.8 g/dL (ref 3.6–5.1)
Alkaline phosphatase (APISO): 88 U/L (ref 33–130)
BUN: 17 mg/dL (ref 7–25)
CO2: 29 mmol/L (ref 20–32)
Calcium: 9.8 mg/dL (ref 8.6–10.4)
Chloride: 103 mmol/L (ref 98–110)
Creat: 0.84 mg/dL (ref 0.50–0.99)
GFR, Est African American: 87 mL/min/{1.73_m2} (ref >=60)
GFR, Est Non African American: 75 mL/min/{1.73_m2} (ref >=60)
Globulin: 2.5 g/dL (calc) (ref 1.9–3.7)
Glucose, Bld: 124 mg/dL — ABNORMAL HIGH (ref 65–99)
Potassium: 4.1 mmol/L (ref 3.5–5.3)
Sodium: 138 mmol/L (ref 135–146)
Total Bilirubin: 0.4 mg/dL (ref 0.2–1.2)
Total Protein: 7.3 g/dL (ref 6.1–8.1)

## 2017-12-19 LAB — HEMOGLOBIN A1C
Hgb A1c MFr Bld: 6 % of total Hgb — ABNORMAL HIGH (ref <5.7)
Mean Plasma Glucose: 126 (calc)
eAG (mmol/L): 7 (calc)

## 2017-12-19 LAB — HIV ANTIBODY (ROUTINE TESTING W REFLEX): HIV 1&2 Ab, 4th Generation: NONREACTIVE

## 2017-12-19 LAB — VITAMIN B12: Vitamin B-12: 711 pg/mL (ref 200–1100)

## 2017-12-19 LAB — TSH: TSH: 4.68 mIU/L — ABNORMAL HIGH (ref 0.40–4.50)

## 2017-12-19 MED ORDER — ATORVASTATIN CALCIUM 20 MG PO TABS: 20.0000 mg | ORAL_TABLET | Freq: Every day | ORAL | 0 refills | Status: DC

## 2017-12-19 MED ORDER — LEVOTHYROXINE SODIUM 100 MCG PO TABS: 100.0000 ug | ORAL_TABLET | Freq: Every day | ORAL | 0 refills | Status: DC

## 2017-12-21 ENCOUNTER — Other Ambulatory Visit: Payer: Self-pay

## 2017-12-21 DIAGNOSIS — J439 Emphysema, unspecified: Secondary | ICD-10-CM

## 2017-12-21 MED ORDER — FLUTICASONE FUROATE-VILANTEROL 200-25 MCG/INH IN AEPB: 1.0000 | INHALATION_SPRAY | Freq: Every day | RESPIRATORY_TRACT | 3 refills | Status: DC

## 2018-01-08 ENCOUNTER — Ambulatory Visit (INDEPENDENT_AMBULATORY_CARE_PROVIDER_SITE_OTHER): Payer: BLUE CROSS/BLUE SHIELD | Admitting: Family Medicine

## 2018-01-08 ENCOUNTER — Encounter: Payer: Self-pay | Admitting: Family Medicine

## 2018-01-08 VITALS — BP 124/75 | HR 76 | Ht 64.0 in | Wt 176.0 lb

## 2018-01-08 DIAGNOSIS — L219 Seborrheic dermatitis, unspecified: Secondary | ICD-10-CM | POA: Diagnosis not present

## 2018-01-08 DIAGNOSIS — E782 Mixed hyperlipidemia: Secondary | ICD-10-CM | POA: Diagnosis not present

## 2018-01-08 DIAGNOSIS — E039 Hypothyroidism, unspecified: Secondary | ICD-10-CM

## 2018-01-08 DIAGNOSIS — I700 Atherosclerosis of aorta: Secondary | ICD-10-CM | POA: Diagnosis not present

## 2018-01-08 DIAGNOSIS — E785 Hyperlipidemia, unspecified: Secondary | ICD-10-CM | POA: Insufficient documentation

## 2018-01-08 MED ORDER — KETOCONAZOLE 2 % EX CREA: 1.0000 "application " | TOPICAL_CREAM | Freq: Every day | CUTANEOUS | 3 refills | Status: DC

## 2018-01-08 NOTE — Patient Instructions (Signed)
Thank you for coming in today. I think you have seborrheic Dermatitis in the eyelids.  Use the prescription ketoconazole cream daily.  Use over the counter 1% hydrocortisone cream daily on the eyelids until it gets better.  Use over-the-counter Zaditor eyedrops (Ketotifen) twice daily as needed for itching.  Continue warm compress.  Recheck if not better.   Seborrheic Dermatitis, Adult Seborrheic dermatitis is a skin disease that causes red, scaly patches. It usually occurs on the scalp, and it is often called dandruff. The patches may appear on other parts of the body. Skin patches tend to appear where there are many oil glands in the skin. Areas of the body that are commonly affected include:  Scalp.  Skin folds of the body.  Ears.  Eyebrows.  Neck.  Face.  Armpits.  The bearded area of men's faces.  The condition may come and go for no known reason, and it is often long-lasting (chronic). What are the causes? The cause of this condition is not known. What increases the risk? This condition is more likely to develop in people who:  Have certain conditions, such as: ? HIV (human immunodeficiency virus). ? AIDS (acquired immunodeficiency syndrome). ? Parkinson disease. ? Mood disorders, such as depression.  Are 1540-62 years old.  What are the signs or symptoms? Symptoms of this condition include:  Thick scales on the scalp.  Redness on the face or in the armpits.  Skin that is flaky. The flakes may be white or yellow.  Skin that seems oily or dry but is not helped with moisturizers.  Itching or burning in the affected areas.  How is this diagnosed? This condition is diagnosed with a medical history and physical exam. A sample of your skin may be tested (skin biopsy). You may need to see a skin specialist (dermatologist). How is this treated? There is no cure for this condition, but treatment can help to manage the symptoms. You may get treatment to remove  scales, lower the risk of skin infection, and reduce swelling or itching. Treatment may include:  Creams that reduce swelling and irritation (steroids).  Creams that reduce skin yeast.  Medicated shampoo, soaps, moisturizing creams, or ointments.  Medicated moisturizing creams or ointments.  Follow these instructions at home:  Apply over-the-counter and prescription medicines only as told by your health care provider.  Use any medicated shampoo, soaps, skin creams, or ointments only as told by your health care provider.  Keep all follow-up visits as told by your health care provider. This is important. Contact a health care provider if:  Your symptoms do not improve with treatment.  Your symptoms get worse.  You have new symptoms. This information is not intended to replace advice given to you by your health care provider. Make sure you discuss any questions you have with your health care provider. Document Released: 10/30/2005 Document Revised: 05/19/2016 Document Reviewed: 02/17/2016 Elsevier Interactive Patient Education  [employer]2018 [employer].

## 2018-01-08 NOTE — Progress Notes (Signed)
Vangie BickerLesa Bellanger is a 62 y.o. female [facility] today for eyelid irritation. [place] this irritation of her eyelids bilaterally left worse than right.  This is been ongoing now for about 6 weeks.  She notes cracked irritated itchy eyelids.  She denies any significant discharge or blurry vision.  She was seen by her ophthalmologist who recommended warm compress.  No fevers or chills vomiting or diarrhea.  She has not tried any specific treatment yet besides from some over-the-counter eyedrops and warm compress.  Symptoms are moderate and obnoxious.  At the last visit Jeidy was started on atorvastatin for cholesterol.  She started taking the medication and denies any significant muscle aches and pains.  Additionally her levothyroxine dose was changed.  She tolerates the new dose well with no issues. Past Medical History:  Diagnosis Date  . COPD (chronic obstructive pulmonary disease) (HCC)   . DIVERTICULOSIS, COLON 12/10/2010   Qualifier: Diagnosis of  By: Orson AloeHenderson MD, Tinnie GensJeffrey    . GERD (gastroesophageal reflux disease)   . GLAUCOMA 09/29/2009   Qualifier: Diagnosis of  By: Linford ArnoldMetheney MD, Santina Evansatherine    . Hypothyroid   . Urinary incontinence 10/19/2011   Past Surgical History:  Procedure Laterality Date  . BREAST CYST EXCISION    . INCONTINENCE SURGERY    . TONSILLECTOMY    . VAGINAL HYSTERECTOMY     Social History   Tobacco Use  . Smoking status: Former Smoker    Packs/day: 0.50    Years: 40.00    Pack years: 20.00    Types: Cigarettes  . Smokeless tobacco: Current User  . Tobacco comment: e-cig  Substance Use Topics  . Alcohol use: Yes    Alcohol/week: 3.6 oz    Types: 6 Standard drinks or equivalent per week   family history includes Asthma in her mother; Lung cancer in her father; Lupus in her father; Thyroid disease in her mother.  ROS as  above:  Medications: Current Outpatient Medications  Medication Sig Dispense Refill  . albuterol (PROAIR HFA) 108 (90 Base) MCG/ACT inhaler Inhale 2 puffs into the lungs every 6 (six) hours as needed for wheezing or shortness of breath. 18 g 1  . atorvastatin (LIPITOR) 20 MG tablet Take 1 tablet (20 mg total) by mouth daily. 90 tablet 0  . Esomeprazole Magnesium (NEXIUM PO) Take by mouth daily.    . fluticasone furoate-vilanterol (BREO ELLIPTA) 200-25 MCG/INH AEPB Inhale 1 puff into the lungs daily. 60 each 3  . levothyroxine (SYNTHROID, LEVOTHROID) 100 MCG tablet Take 1 tablet (100 mcg total) by mouth daily. 90 tablet 0  . LUMIGAN 0.01 % SOLN Place 1 drop into both eyes at bedtime.   0  . Multiple Vitamin (MULTIVITAMIN) tablet Take 1 tablet by mouth daily.    . polyethylene glycol powder (GLYCOLAX/MIRALAX) powder Take 17 g by mouth daily. 850 g 12  . Tiotropium Bromide Monohydrate (SPIRIVA RESPIMAT) 1.25 MCG/ACT AERS Inhale 1 puff into the lungs daily. 4 g 12  . valACYclovir (VALTREX) 1000 MG tablet Take 1 tablet (1,000 mg total) by mouth daily. 90 tablet 1  . vitamin C (ASCORBIC ACID) 500 MG tablet Take 500 mg by mouth daily.    Marland Kitchen. ketoconazole (NIZORAL) 2 % cream Apply 1 application topically daily. To affected areas. 15 g 3   No current facility-administered medications for this visit.    No Known Allergies  Health Maintenance Health Maintenance  Topic  Date Due  . MAMMOGRAM  02/22/2019  . TETANUS/TDAP  10/13/2020  . COLONOSCOPY  03/27/2027  . INFLUENZA VACCINE  Completed  . Hepatitis C Screening  Completed  . HIV Screening  Completed     Exam:  BP 124/75   Pulse 76   Ht 5\' 4"  (1.626 m)   Wt 176 lb (79.8 kg)   BMI 30.21 kg/m  Gen: Well NAD HEENT: EOMI,  MMM erythematous scaly skin of the bilateral upper eyelids worse on the left.  No conjunctival injection.  Eyelids nontender. Lungs: Normal work of breathing. CTABL Heart: RRR no MRG Abd: NABS, Soft. Nondistended,  Nontender Exts: Brisk capillary refill, warm and well perfused.    Lab Results  Component Value Date   TSH 4.68 (H) 12/18/2017   Lab Results  Component Value Date   CHOL 248 (H) 12/18/2017   HDL 76 12/18/2017   LDLCALC 128 07/24/2016   TRIG 102 12/18/2017   CHOLHDL 3.3 12/18/2017      Assessment and Plan: 62 y.o. female with seborrheic dermatitis of the eyelids.  Most likely diagnosis.  Plan for treatment with ketoconazole cream and hydrocortisone cream.  Use Zaditor eyedrops as well.  Continue warm compress.  Recheck if not better.  Hyperlipidemia with aortic atherosclerosis: On atorvastatin tolerating well.  Recheck as scheduled in about a month and a half  Hypothyroidism: Levothyroxine adjusted.  Recheck TSH on next recheck  No orders of the defined types were placed in this encounter.  Meds ordered this encounter  Medications  . ketoconazole (NIZORAL) 2 % cream    Sig: Apply 1 application topically daily. To affected areas.    Dispense:  15 g    Refill:  3     Discussed warning signs or symptoms. Please see discharge instructions. Patient expresses understanding.

## 2018-02-19 ENCOUNTER — Other Ambulatory Visit: Payer: Self-pay | Admitting: Family Medicine

## 2018-02-19 DIAGNOSIS — B001 Herpesviral vesicular dermatitis: Secondary | ICD-10-CM

## 2018-03-18 ENCOUNTER — Encounter: Payer: Self-pay | Admitting: Family Medicine

## 2018-03-18 ENCOUNTER — Ambulatory Visit (INDEPENDENT_AMBULATORY_CARE_PROVIDER_SITE_OTHER): Payer: BLUE CROSS/BLUE SHIELD

## 2018-03-18 ENCOUNTER — Ambulatory Visit (INDEPENDENT_AMBULATORY_CARE_PROVIDER_SITE_OTHER): Payer: BLUE CROSS/BLUE SHIELD | Admitting: Family Medicine

## 2018-03-18 VITALS — BP 152/71 | HR 73 | Ht 64.0 in | Wt 176.0 lb

## 2018-03-18 DIAGNOSIS — R5383 Other fatigue: Secondary | ICD-10-CM

## 2018-03-18 DIAGNOSIS — R05 Cough: Secondary | ICD-10-CM | POA: Diagnosis not present

## 2018-03-18 DIAGNOSIS — J439 Emphysema, unspecified: Secondary | ICD-10-CM

## 2018-03-18 DIAGNOSIS — R221 Localized swelling, mass and lump, neck: Secondary | ICD-10-CM

## 2018-03-18 DIAGNOSIS — E039 Hypothyroidism, unspecified: Secondary | ICD-10-CM | POA: Diagnosis not present

## 2018-03-18 DIAGNOSIS — R059 Cough, unspecified: Secondary | ICD-10-CM

## 2018-03-18 DIAGNOSIS — R232 Flushing: Secondary | ICD-10-CM | POA: Diagnosis not present

## 2018-03-18 DIAGNOSIS — R509 Fever, unspecified: Secondary | ICD-10-CM | POA: Diagnosis not present

## 2018-03-18 LAB — CBC WITH DIFFERENTIAL/PLATELET
Basophils Absolute: 62 cells/uL (ref 0–200)
Basophils Relative: 1.2 %
Eosinophils Absolute: 203 cells/uL (ref 15–500)
Eosinophils Relative: 3.9 %
HCT: 40 % (ref 35.0–45.0)
Hemoglobin: 13.8 g/dL (ref 11.7–15.5)
Lymphs Abs: 2080 cells/uL (ref 850–3900)
MCH: 31.1 pg (ref 27.0–33.0)
MCHC: 34.5 g/dL (ref 32.0–36.0)
MCV: 90.1 fL (ref 80.0–100.0)
MPV: 10 fL (ref 7.5–12.5)
Monocytes Relative: 10.8 %
Neutro Abs: 2293 cells/uL (ref 1500–7800)
Neutrophils Relative %: 44.1 %
Platelets: 262 10*3/uL (ref 140–400)
RBC: 4.44 10*6/uL (ref 3.80–5.10)
RDW: 13 % (ref 11.0–15.0)
Total Lymphocyte: 40 %
WBC mixed population: 562 cells/uL (ref 200–950)
WBC: 5.2 10*3/uL (ref 3.8–10.8)

## 2018-03-18 LAB — SEDIMENTATION RATE: Sed Rate: 14 mm/h (ref 0–30)

## 2018-03-18 LAB — COMPLETE METABOLIC PANEL WITH GFR
AG Ratio: 1.7 (calc) (ref 1.0–2.5)
ALT: 19 U/L (ref 6–29)
AST: 17 U/L (ref 10–35)
Albumin: 4.7 g/dL (ref 3.6–5.1)
Alkaline phosphatase (APISO): 105 U/L (ref 33–130)
BUN: 12 mg/dL (ref 7–25)
CO2: 32 mmol/L (ref 20–32)
Calcium: 9.9 mg/dL (ref 8.6–10.4)
Chloride: 99 mmol/L (ref 98–110)
Creat: 0.72 mg/dL (ref 0.50–0.99)
GFR, Est African American: 104 mL/min/{1.73_m2} (ref >=60)
GFR, Est Non African American: 90 mL/min/{1.73_m2} (ref >=60)
Globulin: 2.7 g/dL (calc) (ref 1.9–3.7)
Glucose, Bld: 122 mg/dL — ABNORMAL HIGH (ref 65–99)
Potassium: 4 mmol/L (ref 3.5–5.3)
Sodium: 137 mmol/L (ref 135–146)
Total Bilirubin: 0.4 mg/dL (ref 0.2–1.2)
Total Protein: 7.4 g/dL (ref 6.1–8.1)

## 2018-03-18 LAB — T4, FREE: Free T4: 1.5 ng/dL (ref 0.8–1.8)

## 2018-03-18 LAB — T3, FREE: T3, Free: 2.8 pg/mL (ref 2.3–4.2)

## 2018-03-18 LAB — TSH: TSH: 2.85 mIU/L (ref 0.40–4.50)

## 2018-03-18 IMAGING — DX DG CHEST 2V
2 series · 2 of 2 positions shown · non-contrast
Comparison: 04/23/2017

CLINICAL DATA: One month of productive cough.

EXAM:
CHEST - 2 VIEW

[chest pa]
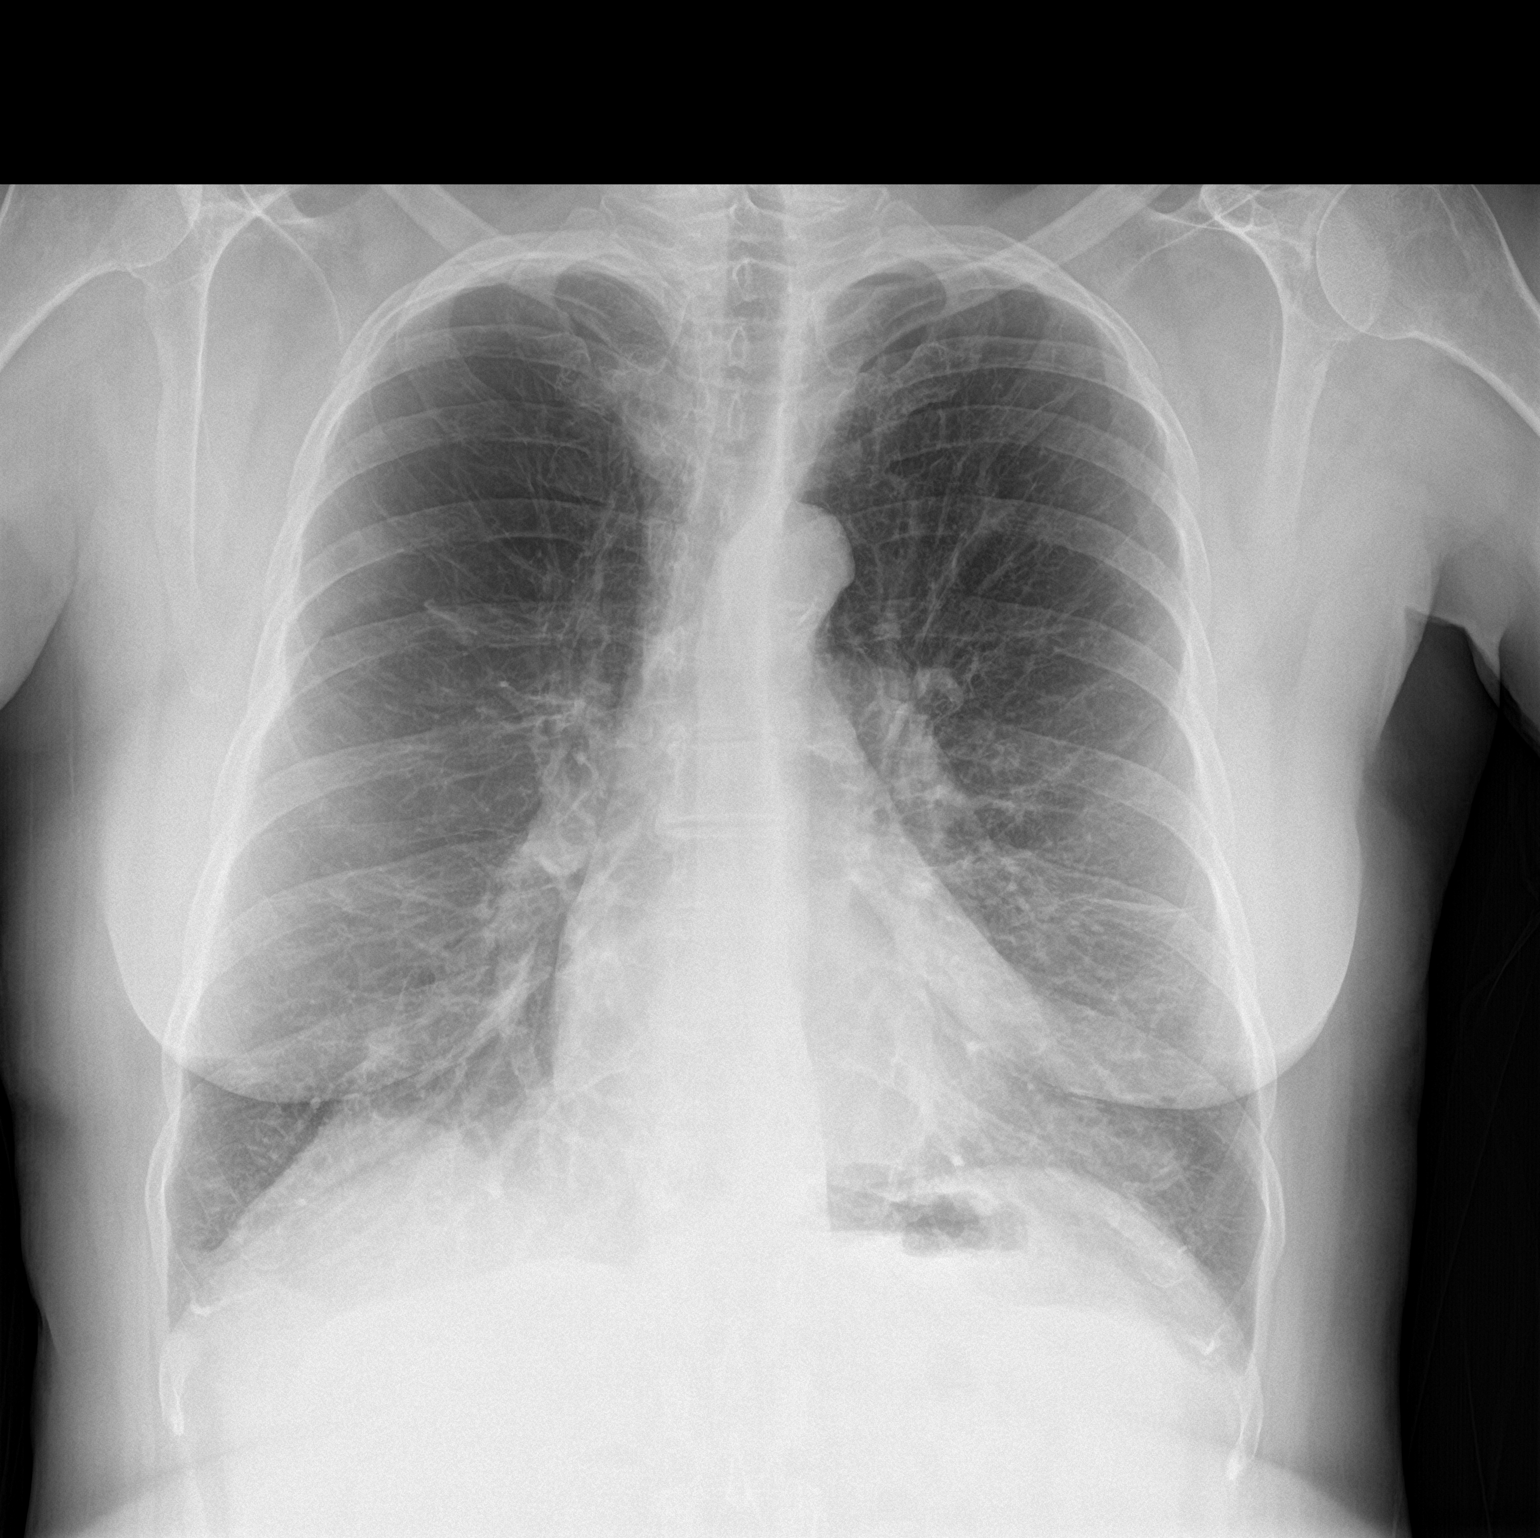

[chest lat]
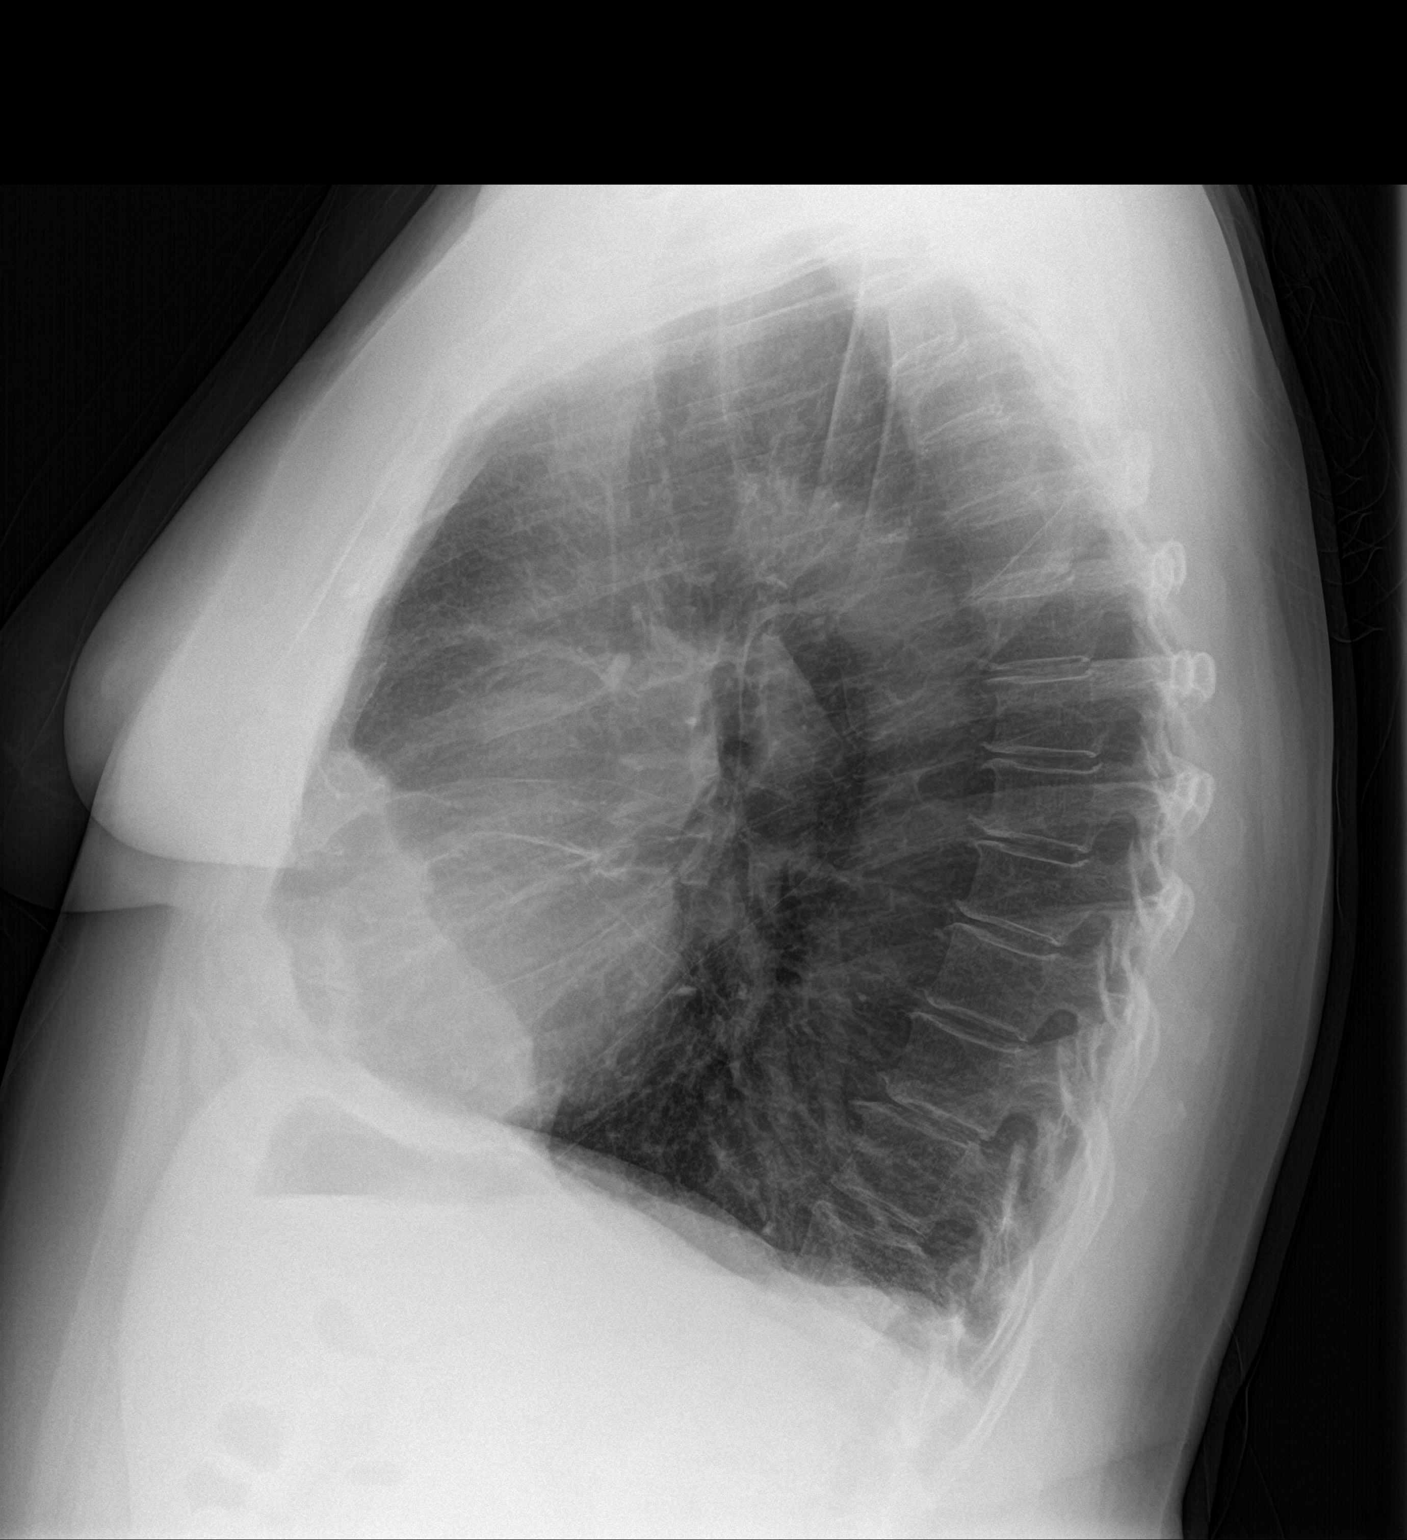

[2 of 2 positions shown; findings below may reference images not displayed]

FINDINGS: Cardiomediastinal silhouette is normal. Mediastinal contours appear
intact. Mild calcific atherosclerotic disease of the aorta.

There is no evidence of focal airspace consolidation, pleural
effusion or pneumothorax.

Osseous structures are without acute abnormality. Soft tissues are
grossly normal.
IMPRESSION: No active cardiopulmonary disease.

## 2018-03-18 MED ORDER — ATORVASTATIN CALCIUM 20 MG PO TABS: 20.0000 mg | ORAL_TABLET | Freq: Every day | ORAL | 1 refills | Status: DC

## 2018-03-18 NOTE — Patient Instructions (Addendum)
Thank you for coming in today. Get xray and schedule ultrasound.  Get labs today.  If not better in a few weeks recheck.  If something comes up on labs or xray we will get more labs or other imaging tests.  I will talk with you about those either in person or over the phone.   I will refill the thyroid medicine tomorrow.    Fever, Adult A fever is an increase in the body's temperature. It is often defined as a temperature of 100 F (38C) or higher. Short mild or moderate fevers often have no long-term effects. They also often do not need treatment. Moderate or high fevers may make you feel uncomfortable. Sometimes, they can also be a sign of a serious illness or disease. The sweating that may happen with repeated fevers or fevers that last a while may also cause you to not have enough fluid in your body (dehydration). You can take your temperature with a thermometer to see if you have a fever. A measured temperature can change with:  Age.  Time of day.  Where the thermometer is placed: ? Mouth (oral). ? Rectum (rectal). ? Ear (tympanic). ? Underarm (axillary). ? Forehead (temporal).  Follow these instructions at home: Pay attention to any changes in your symptoms. Take these actions to help with your condition:  Take over-the-counter and prescription medicines only as told by your doctor. Follow the dosing instructions carefully.  If you were prescribed an antibiotic medicine, take it as told by your doctor. Do not stop taking the antibiotic even if you start to feel better.  Rest as needed.  Drink enough fluid to keep your pee (urine) clear or pale yellow.  Sponge yourself or bathe with room-temperature water as needed. This helps to lower your body temperature . Do not use ice water.  Do not wear too many blankets or heavy clothes.  Contact a doctor if:  You throw up (vomit).  You cannot eat or drink without throwing up.  You have watery poop (diarrhea).  It hurts  when you pee.  Your symptoms do not get better with treatment.  You have new symptoms.  You feel very weak. Get help right away if:  You are short of breath or have trouble breathing.  You are dizzy or you pass out (faint).  You feel confused.  You have signs of not having enough fluid in your body, such as: ? A dry mouth. ? Peeing less. ? Looking pale.  You have very bad pain in your belly (abdomen).  You keep throwing up or having water poop.  You have a skin rash.  Your symptoms suddenly get worse. This information is not intended to replace advice given to you by your health care provider. Make sure you discuss any questions you have with your health care provider. Document Released: 08/08/2008 Document Revised: 04/06/2016 Document Reviewed: 12/24/2014 Elsevier Interactive Patient Education  [employer].

## 2018-03-18 NOTE — Progress Notes (Signed)
Diana Solis is a 62 y.o. female who presents to [place]: [place] today for  Hot flashes. Diana Solis notes ongoing hot flashes worsening over the last several weeks to months.  She has had hot flashes off and on for years and she notes also at the same time her hot flashes worsened he had an episode of fever for a while as well as a cough. She notes that fever was around 100 deg F and lasted for a few weeks. She denies any unintentional weight loss.   Her Levothyroixine dose was changed a few months ago when she is due for refill today.  Additionally she notes coughing and some choking when she drinks liquids.  She has no trouble eating solids.  She denies any vomiting diarrhea or acid reflux.   Past Medical History:  Diagnosis Date  . COPD (chronic obstructive pulmonary disease) ([place])   . DIVERTICULOSIS, COLON 12/10/2010   Qualifier: Diagnosis of  By: Koleen Nimrod MD, Dellis Filbert    . GERD (gastroesophageal reflux disease)   . GLAUCOMA 09/29/2009   Qualifier: Diagnosis of  By: Madilyn Fireman MD, Barnetta Chapel    . Hypothyroid   . Urinary incontinence 10/19/2011   Past Surgical History:  Procedure Laterality Date  . BREAST CYST EXCISION    . INCONTINENCE SURGERY    . TONSILLECTOMY    . VAGINAL HYSTERECTOMY     Social History   Tobacco Use  . Smoking status: Former Smoker    Packs/day: 0.50    Years: 40.00    Pack years: 20.00    Types: Cigarettes  . Smokeless tobacco: Current User  . Tobacco comment: e-cig  Substance Use Topics  . Alcohol use: Yes    Alcohol/week: 3.6 oz    Types: 6 Standard drinks or equivalent per week   family history includes Asthma in her mother; Lung cancer in her father; Lupus in her father; Thyroid disease in her mother.  ROS as above:  Medications: Current Outpatient Medications  Medication Sig Dispense Refill  . albuterol (PROAIR HFA) 108 (90 Base)  MCG/ACT inhaler Inhale 2 puffs into the lungs every 6 (six) hours as needed for wheezing or shortness of breath. 18 g 1  . atorvastatin (LIPITOR) 20 MG tablet Take 1 tablet (20 mg total) by mouth daily. 90 tablet 1  . Esomeprazole Magnesium (NEXIUM PO) Take by mouth daily.    . fluticasone furoate-vilanterol (BREO ELLIPTA) 200-25 MCG/INH AEPB Inhale 1 puff into the lungs daily. 60 each 3  . ketoconazole (NIZORAL) 2 % cream Apply 1 application topically daily. To affected areas. 15 g 3  . levothyroxine (SYNTHROID, LEVOTHROID) 100 MCG tablet Take 1 tablet (100 mcg total) by mouth daily. 90 tablet 0  . LUMIGAN 0.01 % SOLN Place 1 drop into both eyes at bedtime.   0  . Multiple Vitamin (MULTIVITAMIN) tablet Take 1 tablet by mouth daily.    . polyethylene glycol powder (GLYCOLAX/MIRALAX) powder Take 17 g by mouth daily. 850 g 12  . Tiotropium Bromide Monohydrate (SPIRIVA RESPIMAT) 1.25 MCG/ACT AERS Inhale 1 puff into the lungs daily. 4 g 12  . valACYclovir (VALTREX) 1000 MG tablet take 1 tablet by mouth daily 90 tablet 0  . vitamin C (ASCORBIC ACID) 500 MG tablet Take 500 mg by mouth daily.     No current facility-administered medications for this visit.    No Known Allergies  Health Maintenance Health Maintenance  Topic Date Due  . INFLUENZA  VACCINE  06/13/2018  . MAMMOGRAM  02/22/2019  . TETANUS/TDAP  10/13/2020  . COLONOSCOPY  03/27/2027  . Hepatitis C Screening  Completed  . HIV Screening  Completed     Exam:  BP (!) 152/71   Pulse 73   Ht 5' 4"  (1.626 m)   Wt 176 lb (79.8 kg)   BMI 30.21 kg/m  Gen: Well NAD HEENT: EOMI,  MMM no cervical lymphadenopathy or goiter.  Normal posterior pharynx Lungs: Normal work of breathing. CTABL Heart: RRR no MRG Abd: NABS, Soft. Nondistended, Nontender Exts: Brisk capillary refill, warm and well perfused.      Assessment and Plan: 62 y.o. female with  Hot flashes: Unclear etiology.  Multifactorial with multiple different diagnoses  possible. Plan to recheck TSH as well as check sedimentation rate and chest x-ray and neck soft tissue ultrasound.  Check basic labs as well.  If not better next step would likely be CT scan of the neck.  Coughing with water.  Again etiology is unclear.  It seems to have resolved.  Ultrasound soft tissue of the neck should help evaluate this.  Next step would be CT scan versus ENT or GI referral or swallow study.   Hypothyroidism: Recheck TSH.  Patient does not think that she is ever had measles.  Will check MMR immunity   Orders Placed This Encounter  Procedures  . US Soft Tissue Head/Neck    Standing Status:   Future    Standing Expiration Date:   05/19/2019    Order Specific Question:   Reason for Exam (SYMPTOM  OR DIAGNOSIS REQUIRED)    Answer:   eval right lateral neck swelling and also thrypoid    Order Specific Question:   Preferred imaging location?    Answer:   [facility]  . DG Chest 2 View    Order Specific Question:   Reason for exam:    Answer:   Cough, assess intra-thoracic pathology    Order Specific Question:   Preferred imaging location?    Answer:   [facility]  . CBC with Differential/Platelet  . COMPLETE METABOLIC PANEL WITH GFR  . TSH  . T3, free  . T4, free  . Sedimentation rate  . Measles/Mumps/Rubella Immunity   Meds ordered this encounter  Medications  . atorvastatin (LIPITOR) 20 MG tablet    Sig: Take 1 tablet (20 mg total) by mouth daily.    Dispense:  90 tablet    Refill:  1     Discussed warning signs or symptoms. Please see discharge instructions. Patient expresses understanding.

## 2018-03-19 ENCOUNTER — Ambulatory Visit (INDEPENDENT_AMBULATORY_CARE_PROVIDER_SITE_OTHER): Payer: BLUE CROSS/BLUE SHIELD

## 2018-03-19 DIAGNOSIS — R221 Localized swelling, mass and lump, neck: Secondary | ICD-10-CM

## 2018-03-19 DIAGNOSIS — R509 Fever, unspecified: Secondary | ICD-10-CM

## 2018-03-19 DIAGNOSIS — E039 Hypothyroidism, unspecified: Secondary | ICD-10-CM

## 2018-03-19 IMAGING — US US THYROID
1 series · 14 of 25 positions shown · non-contrast
Comparison: None.

CLINICAL DATA: Hypothyroid.  Right neck swelling.

EXAM:
THYROID ULTRASOUND
TECHNIQUE: Ultrasound examination of the thyroid gland and adjacent soft
tissues was performed.

[Series 1: us thyroid · 0.06mm/px · 14 of 43 slices shown]
[im 1/43]
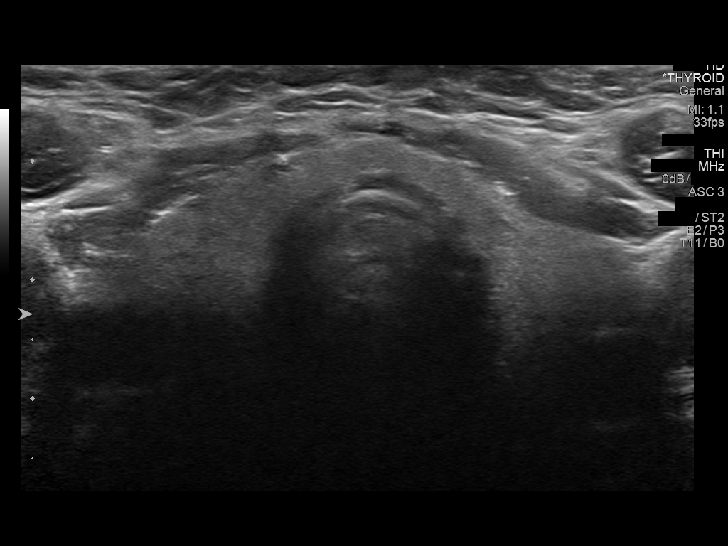
[im 4/43]
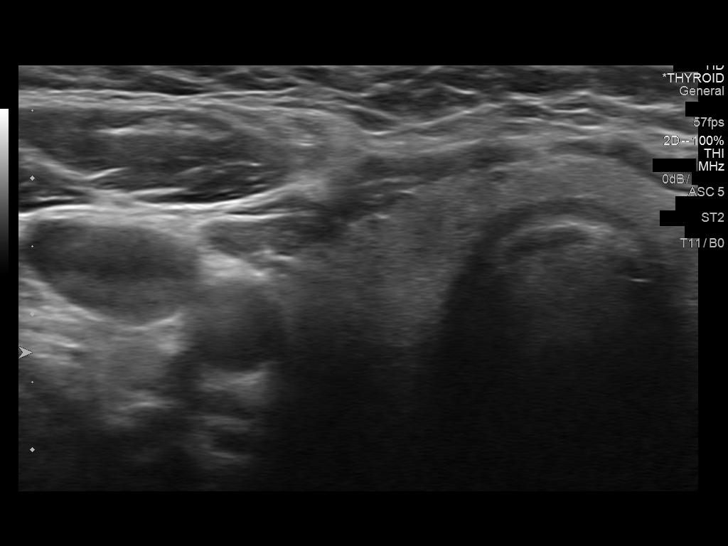
[im 8/43]
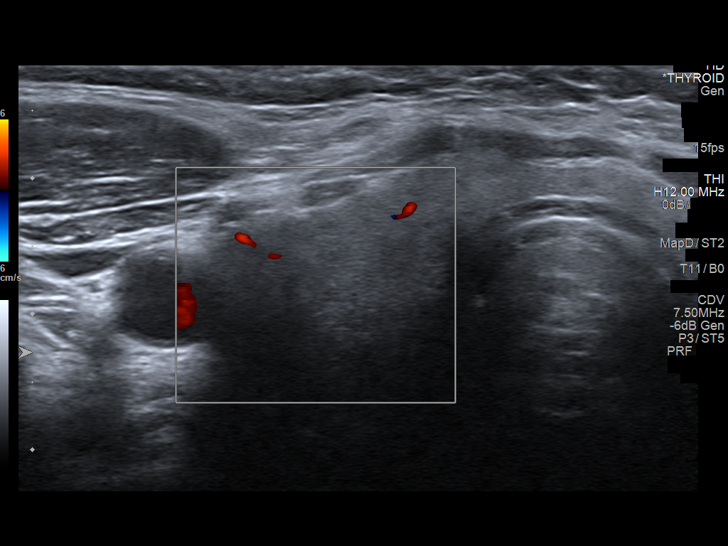
[im 11/43]
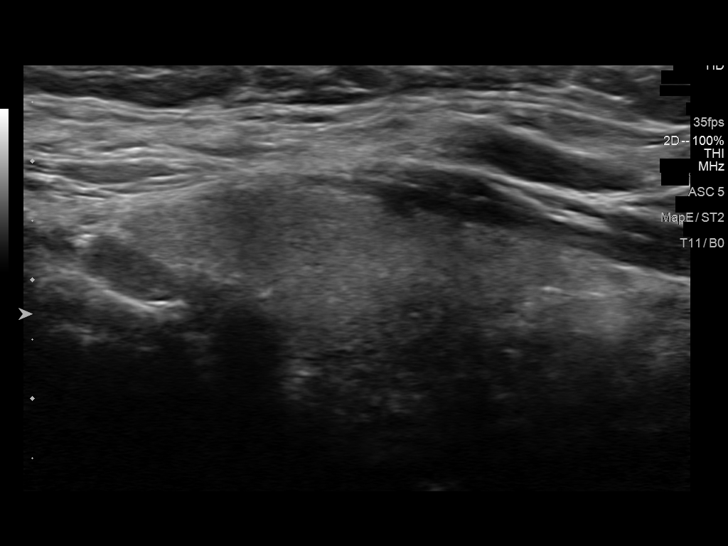
[im 15/43]
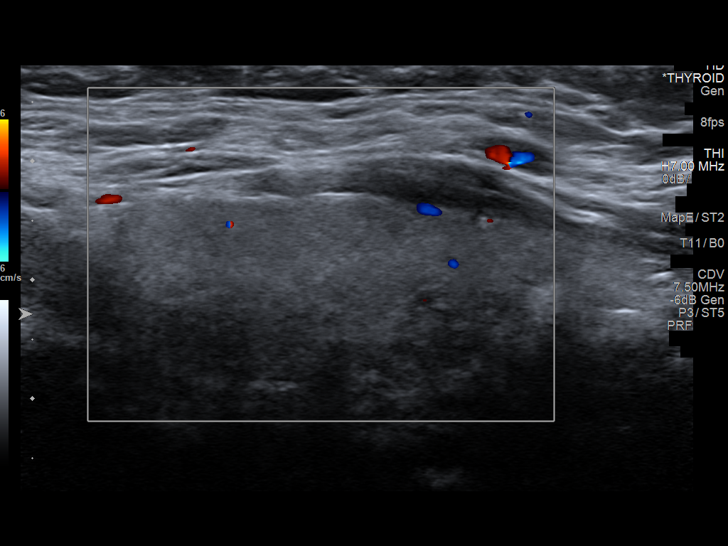
[im 16/43]
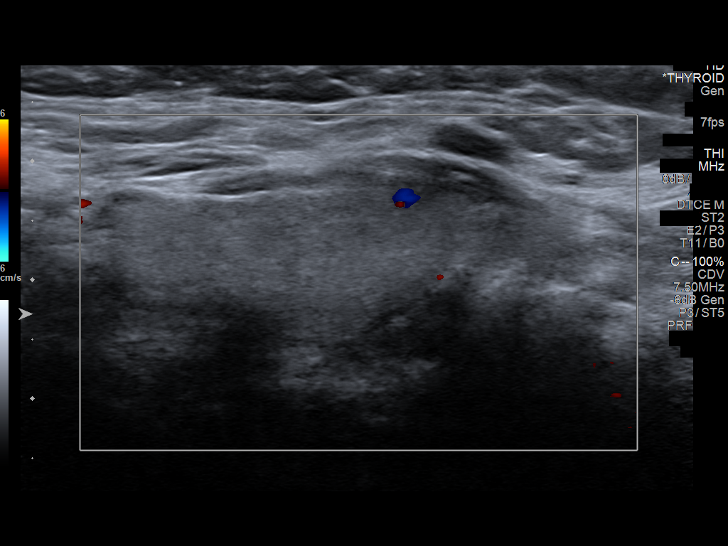
[im 20/43]
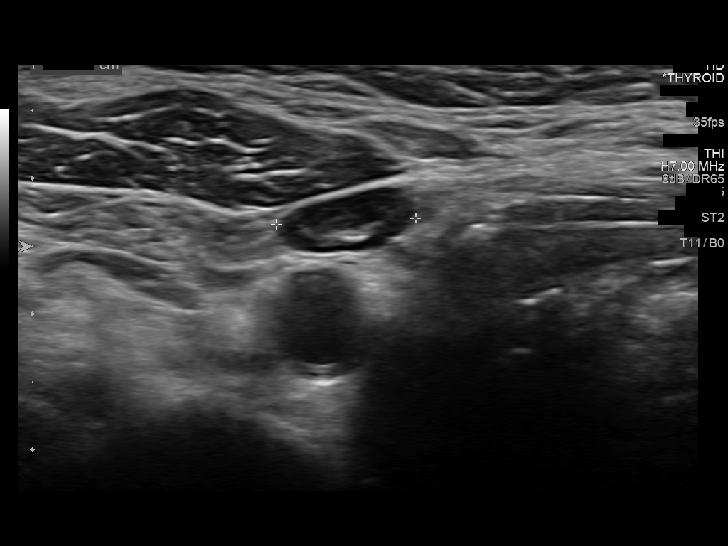
[im 23/43]
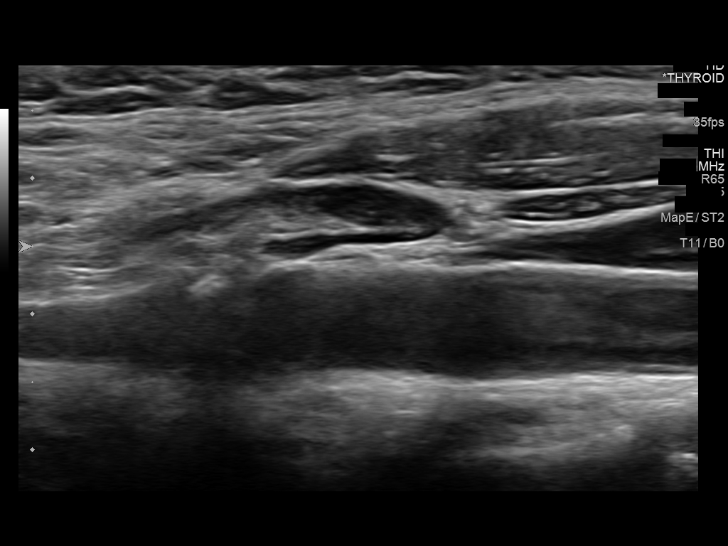
[im 27/43]
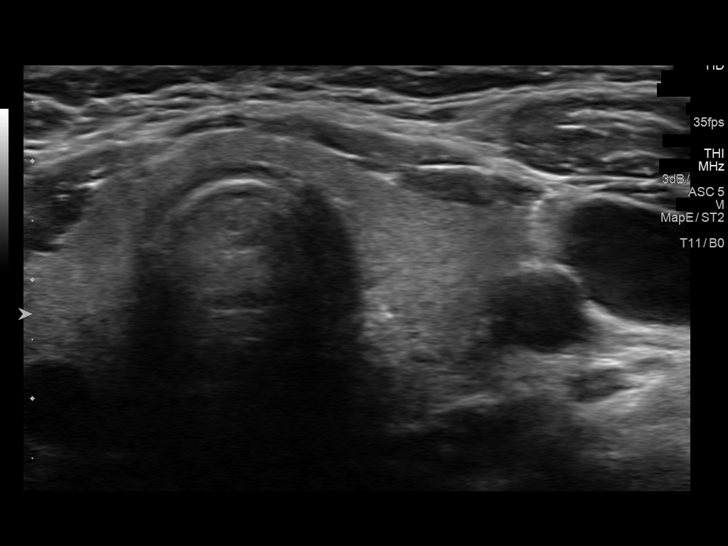
[im 29/43]
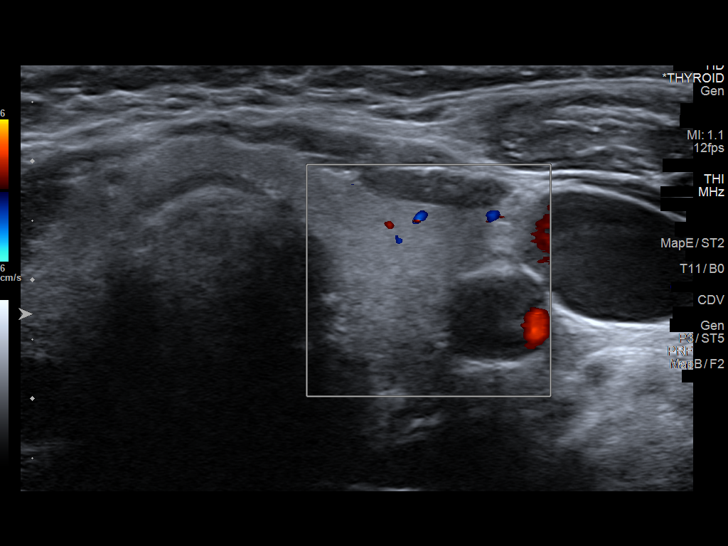
[im 32/43]
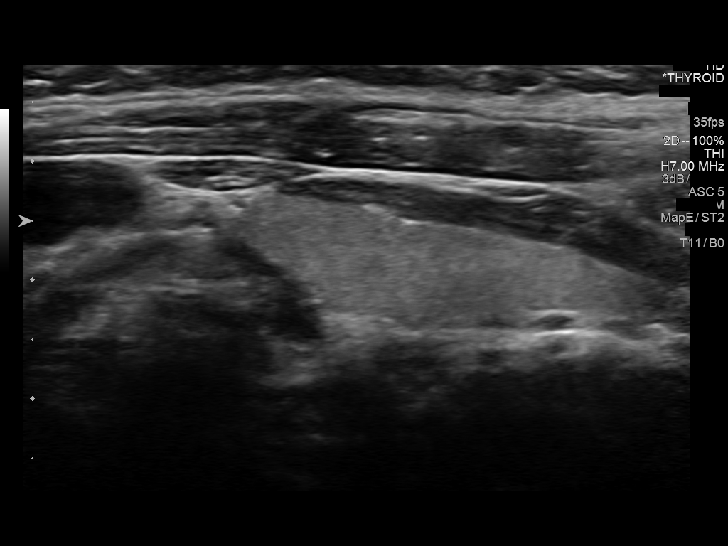
[im 36/43]
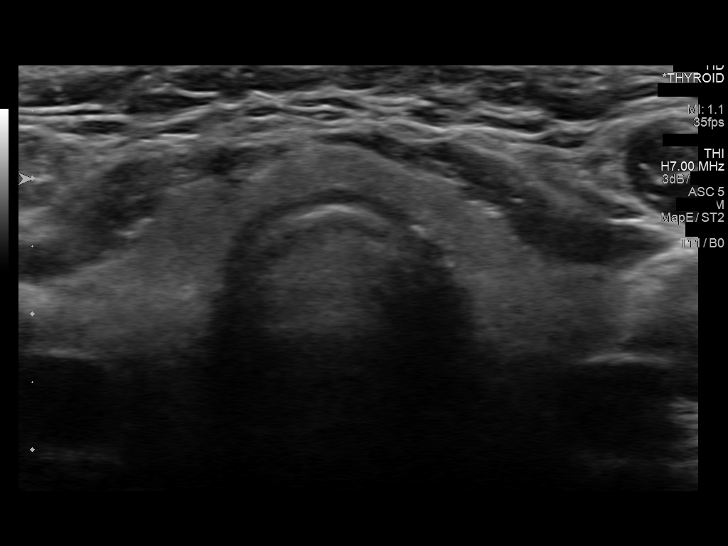
[im 39/43]
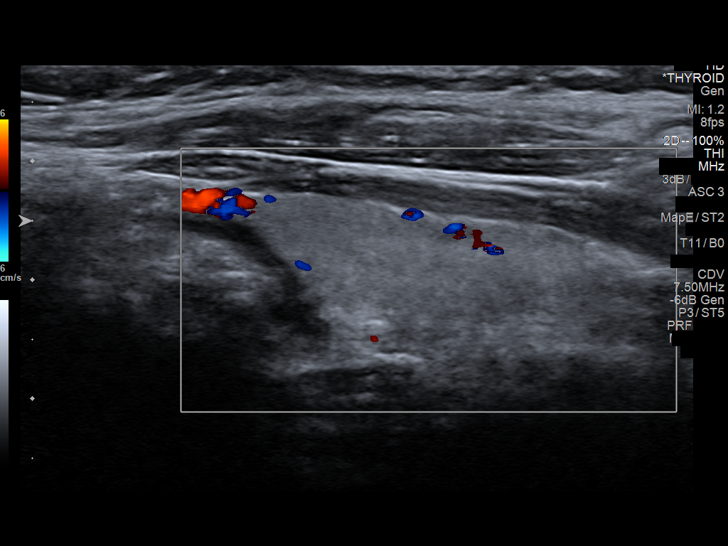
[im 43/43]
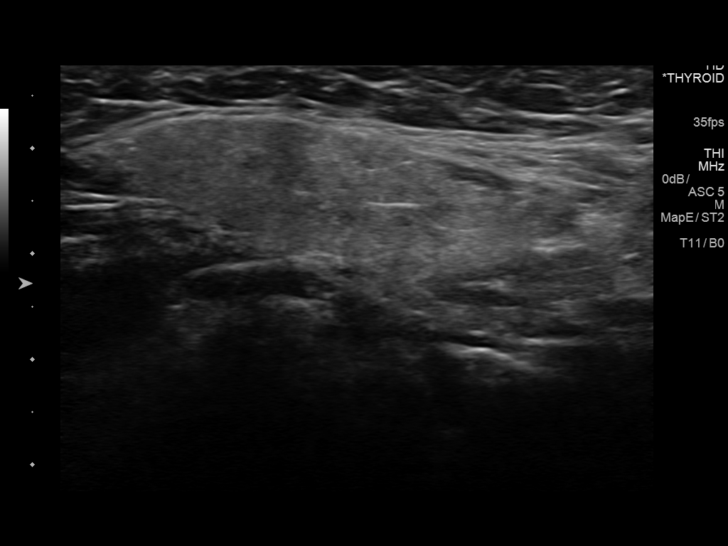

[14 of 25 positions shown; findings below may reference images not displayed]

FINDINGS: Parenchymal Echotexture: Mildly heterogenous

Isthmus: 0.3 cm

Right lobe: 4.6 x 1.4 x 1.8 cm

Left lobe: 4.1 x 1.0 x 1.6 cm

_________________________________________________________

Estimated total number of nodules >/= 1 cm: 0

Number of spongiform nodules >/=  2 cm not described below (TR1): 0

Number of mixed cystic and solid nodules >/= 1.5 cm not described
below (TR2): 0

_________________________________________________________

No discrete nodules are seen within the thyroid gland. No enlarged
or abnormal appearing lymph nodes identified. Single right cervical
lymph node was measured showing normal size and short axis
measurement of 0.6 cm.
IMPRESSION: Unremarkable thyroid ultrasound.

The above is in keeping with the ACR TI-RADS recommendations - [HOSPITAL] 1414;[DATE].

## 2018-03-19 MED ORDER — LEVOTHYROXINE SODIUM 100 MCG PO TABS: 100.0000 ug | ORAL_TABLET | Freq: Every day | ORAL | 1 refills | Status: DC

## 2018-03-19 NOTE — Addendum Note (Signed)
Addended by: Rodolph Bong on: 03/19/2018 07:00 AM   Modules accepted: Orders

## 2018-03-20 LAB — MEASLES/MUMPS/RUBELLA IMMUNITY
Mumps IgG: <9 AU/mL — ABNORMAL LOW
Rubella: 32.6 index
Rubeola IgG: 26 AU/mL — ABNORMAL LOW

## 2018-03-22 ENCOUNTER — Ambulatory Visit: Payer: BLUE CROSS/BLUE SHIELD

## 2018-03-22 ENCOUNTER — Other Ambulatory Visit: Payer: Self-pay | Admitting: Family Medicine

## 2018-03-25 ENCOUNTER — Ambulatory Visit (INDEPENDENT_AMBULATORY_CARE_PROVIDER_SITE_OTHER): Payer: BLUE CROSS/BLUE SHIELD | Admitting: Family Medicine

## 2018-03-25 VITALS — Temp 97.8°F

## 2018-03-25 DIAGNOSIS — Z23 Encounter for immunization: Secondary | ICD-10-CM

## 2018-03-25 NOTE — Progress Notes (Signed)
   Subjective:    Patient ID: Diana Solis, female    DOB: 26-Sep-1956, 62 y.o.   MRN: [id number]  HPI  Diana Solis is here for a MMR vaccine.   Review of Systems     Objective:   Physical Exam        Assessment & Plan:  Patient tolerated injection well without complications.

## 2018-04-23 ENCOUNTER — Other Ambulatory Visit: Payer: Self-pay | Admitting: Family Medicine

## 2018-04-23 DIAGNOSIS — J439 Emphysema, unspecified: Secondary | ICD-10-CM

## 2018-04-29 ENCOUNTER — Ambulatory Visit: Payer: BLUE CROSS/BLUE SHIELD | Admitting: Family Medicine

## 2018-04-30 ENCOUNTER — Ambulatory Visit (INDEPENDENT_AMBULATORY_CARE_PROVIDER_SITE_OTHER): Payer: BLUE CROSS/BLUE SHIELD | Admitting: Family Medicine

## 2018-04-30 ENCOUNTER — Encounter: Payer: Self-pay | Admitting: Family Medicine

## 2018-04-30 VITALS — BP 135/71 | HR 84 | Ht 64.0 in | Wt 179.0 lb

## 2018-04-30 DIAGNOSIS — R4702 Dysphasia: Secondary | ICD-10-CM

## 2018-04-30 DIAGNOSIS — E782 Mixed hyperlipidemia: Secondary | ICD-10-CM | POA: Diagnosis not present

## 2018-04-30 DIAGNOSIS — R5383 Other fatigue: Secondary | ICD-10-CM | POA: Diagnosis not present

## 2018-04-30 DIAGNOSIS — R232 Flushing: Secondary | ICD-10-CM | POA: Diagnosis not present

## 2018-04-30 NOTE — Progress Notes (Signed)
Diana Solis is a 62 y.o. female who [facility] today for hot flashes, swallow difficulty.  Diana Solis has a history of hot flashes.  This is been ongoing now for years.  She is status post hysterectomy and has not had menstrual bleeding for years.  She does still have an ovary however.  She has a hot flashes are quite bothersome and interfere with sleep.  She notes in the past she did have some hormonal replacement which seemed to help with a bit but has been sometime since then.  She is willing to consider medication but would like to avoid medicines that may cause weight gain.  She had some limited work-up last month which was essentially unremarkable.  Additionally she has some swallowing difficulty.  She feels a sensation of food occasionally gets stuck in her esophagus.  She denies any episodes of definitive food getting stuck.  She can drink water and eat solids.  She denies significant coughing or gagging after eating.  She denies abdominal pain fevers or chills.  She does take Nexium daily.  She would like to avoid further EGD if possible.  She had a colonoscopy last year.  Additionally patient has hyperlipidemia.  LDL is elevated in February and she was started on atorvastatin.  She tolerates medication well without significant muscle aches or pains.   ROS as above:  Exam:  BP 135/71   Pulse 84   Ht 5\' 4"  (1.626 m)   Wt 179 lb (81.2 kg)   BMI 30.73 kg/m  Gen: Well NAD HEENT: EOMI,  MMM Lungs: Normal work of breathing. CTABL Heart: RRR no MRG Abd: NABS, Soft. Nondistended, Nontender Exts: Brisk capillary refill, warm and well perfused.    Lab and Radiology Results Lab Results  Component Value Date   TSH 2.85 03/18/2018   Lab Results  Component Value Date   CHOL 248 (H) 12/18/2017   HDL 76 12/18/2017   LDLCALC 150 (H) 12/18/2017   TRIG 102  12/18/2017   CHOLHDL 3.3 12/18/2017      Assessment and Plan: 62 y.o. female with  Hot flashes likely menopausal or perimenopausal.  Discussed options.  Plan for work-up listed below.  Next best treatment option would likely be effects or.  Handout provided patient will think about it.  I think it is a good option.  Swallow difficulties: Unclear etiology.  Suspicious for is a globus sensation versus esophageal stricture.  Patient would like to proceed with watchful waiting at this point if worsening or not improving next step would likely be referral to gastroneurology for EGD.  Hyperlipidemia: Plan to recheck lipid panel with fasting labs in the near future.   Orders Placed This Encounter  Procedures  . FSH/LH  . Estrogens, total  . Progesterone  . Cortisol  . Lipid Panel w/reflex Direct LDL   No orders of the defined types were placed in this encounter.    Historical information moved to improve visibility of documentation.  Past Medical History:  Diagnosis Date  . COPD (chronic obstructive pulmonary disease) ([facility])   . DIVERTICULOSIS, COLON 12/10/2010   Qualifier: Diagnosis of  By: Orson AloeHenderson MD, Tinnie GensJeffrey    . GERD (gastroesophageal reflux disease)   . GLAUCOMA 09/29/2009   Qualifier: Diagnosis of  By: Linford ArnoldMetheney MD, Santina Evansatherine    . Hypothyroid   . Urinary incontinence 10/19/2011   Past Surgical History:  Procedure Laterality Date  . BREAST CYST EXCISION    .  INCONTINENCE SURGERY    . TONSILLECTOMY    . VAGINAL HYSTERECTOMY     Social History   Tobacco Use  . Smoking status: Former Smoker    Packs/day: 0.50    Years: 40.00    Pack years: 20.00    Types: Cigarettes  . Smokeless tobacco: Current User  . Tobacco comment: e-cig  Substance Use Topics  . Alcohol use: Yes    Alcohol/week: 3.6 oz    Types: 6 Standard drinks or equivalent per week   family history includes Asthma in her mother; Lung cancer in her father; Lupus in her father; Thyroid disease in her  mother.  Medications: Current Outpatient Medications  Medication Sig Dispense Refill  . albuterol (PROAIR HFA) 108 (90 Base) MCG/ACT inhaler Inhale 2 puffs into the lungs every 6 (six) hours as needed for wheezing or shortness of breath. 18 g 1  . atorvastatin (LIPITOR) 20 MG tablet Take 1 tablet (20 mg total) by mouth daily. 90 tablet 1  . BREO ELLIPTA 200-25 MCG/INH AEPB inhalation 1 puff by mouth once daily 60 each 3  . Esomeprazole Magnesium (NEXIUM PO) Take by mouth daily.    Marland Kitchen ketoconazole (NIZORAL) 2 % cream Apply 1 application topically daily. To affected areas. 15 g 3  . levothyroxine (SYNTHROID, LEVOTHROID) 100 MCG tablet Take 1 tablet (100 mcg total) by mouth daily. 90 tablet 1  . LUMIGAN 0.01 % SOLN Place 1 drop into both eyes at bedtime.   0  . Multiple Vitamin (MULTIVITAMIN) tablet Take 1 tablet by mouth daily.    . polyethylene glycol powder (GLYCOLAX/MIRALAX) powder Take 17 g by mouth daily. 850 g 12  . SPIRIVA RESPIMAT 1.25 MCG/ACT AERS inhale 1 puff by mouth daily 4 g 12  . valACYclovir (VALTREX) 1000 MG tablet take 1 tablet by mouth daily 90 tablet 0  . vitamin C (ASCORBIC ACID) 500 MG tablet Take 500 mg by mouth daily.     No current facility-administered medications for this visit.    No Known Allergies  Health Maintenance Health Maintenance  Topic Date Due  . INFLUENZA VACCINE  06/13/2018  . MAMMOGRAM  02/22/2019  . TETANUS/TDAP  10/13/2020  . COLONOSCOPY  03/27/2027  . Hepatitis C Screening  Completed  . HIV Screening  Completed    Discussed warning signs or symptoms. Please see discharge instructions. Patient expresses understanding.

## 2018-04-30 NOTE — Patient Instructions (Signed)
Thank you for coming in today. We will consider medications like Effexor or Prozac for hot flashes.  Get fasting labs in the near future.  Keep an eye on the swallow. If it worsens next step is stomach doctor.   Perimenopause Perimenopause is the time when your body begins to move into the menopause (no menstrual period for 12 straight months). It is a natural process. Perimenopause can begin 2-8 years before the menopause and usually lasts for 1 year after the menopause. During this time, your ovaries may or may not produce an egg. The ovaries vary in their production of estrogen and progesterone hormones each month. This can cause irregular menstrual periods, difficulty getting pregnant, vaginal bleeding between periods, and uncomfortable symptoms. What are the causes?  Irregular production of the ovarian hormones, estrogen and progesterone, and not ovulating every month. Other causes include:  Tumor of the pituitary gland in the brain.  Medical disease that affects the ovaries.  Radiation treatment.  Chemotherapy.  Unknown causes.  Heavy smoking and excessive alcohol intake can bring on perimenopause sooner.  What are the signs or symptoms?  Hot flashes.  Night sweats.  Irregular menstrual periods.  Decreased sex drive.  Vaginal dryness.  Headaches.  Mood swings.  Depression.  Memory problems.  Irritability.  Tiredness.  Weight gain.  Trouble getting pregnant.  The beginning of losing bone cells (osteoporosis).  The beginning of hardening of the arteries (atherosclerosis). How is this diagnosed? Your health care provider will make a diagnosis by analyzing your age, menstrual history, and symptoms. He or she will do a physical exam and note any changes in your body, especially your female organs. Female hormone tests may or may not be helpful depending on the amount of female hormones you produce and when you produce them. However, other hormone tests may be  helpful to rule out other problems. How is this treated? In some cases, no treatment is needed. The decision on whether treatment is necessary during the perimenopause should be made by you and your health care provider based on how the symptoms are affecting you and your lifestyle. Various treatments are available, such as:  Treating individual symptoms with a specific medicine for that symptom.  Herbal medicines that can help specific symptoms.  Counseling.  Group therapy.  Follow these instructions at home:  Keep track of your menstrual periods (when they occur, how heavy they are, how long between periods, and how long they last) as well as your symptoms and when they started.  Only take over-the-counter or prescription medicines as directed by your health care provider.  Sleep and rest.  Exercise.  Eat a diet that contains calcium (good for your bones) and soy (acts like the estrogen hormone).  Do not smoke.  Avoid alcoholic beverages.  Take vitamin supplements as recommended by your health care provider. Taking vitamin E may help in certain cases.  Take calcium and vitamin D supplements to help prevent bone loss.  Group therapy is sometimes helpful.  Acupuncture may help in some cases. Contact a health care provider if:  You have questions about any symptoms you are having.  You need a referral to a specialist (gynecologist, psychiatrist, or psychologist). Get help right away if:  You have vaginal bleeding.  Your period lasts longer than 8 days.  Your periods are recurring sooner than 21 days.  You have bleeding after intercourse.  You have severe depression.  You have pain when you urinate.  You have severe headaches.  You have vision problems. This information is not intended to replace advice given to you by your health care provider. Make sure you discuss any questions you have with your health care provider. Document Released: 12/07/2004 Document  Revised: 04/06/2016 Document Reviewed: 05/29/2013 Elsevier Interactive Patient Education  2017 [employer].

## 2018-05-09 MED ORDER — ATORVASTATIN CALCIUM 40 MG PO TABS: 40.0000 mg | ORAL_TABLET | Freq: Every day | ORAL | 1 refills | Status: DC

## 2018-05-09 NOTE — Addendum Note (Signed)
Addended by: Rodolph BongOREY, Bennette Hasty S on: 05/09/2018 12:55 PM   Modules accepted: Orders

## 2018-05-11 LAB — LIPID PANEL W/REFLEX DIRECT LDL
Cholesterol: 175 mg/dL (ref <200)
HDL: 66 mg/dL (ref >50)
LDL Cholesterol (Calc): 90 mg/dL (calc)
Non-HDL Cholesterol (Calc): 109 mg/dL (calc) (ref <130)
Total CHOL/HDL Ratio: 2.7 (calc) (ref <5.0)
Triglycerides: 98 mg/dL (ref <150)

## 2018-05-11 LAB — PROGESTERONE: Progesterone: <0.5 ng/mL

## 2018-05-11 LAB — FSH/LH
FSH: 58.8 m[IU]/mL
LH: 27.2 m[IU]/mL

## 2018-05-11 LAB — ESTROGENS, TOTAL: Estrogen: 91.2 pg/mL

## 2018-05-11 LAB — CORTISOL: Cortisol, Plasma: 6.8 ug/dL

## 2018-05-20 ENCOUNTER — Other Ambulatory Visit: Payer: Self-pay | Admitting: Family Medicine

## 2018-05-20 DIAGNOSIS — B001 Herpesviral vesicular dermatitis: Secondary | ICD-10-CM

## 2018-07-25 ENCOUNTER — Ambulatory Visit (INDEPENDENT_AMBULATORY_CARE_PROVIDER_SITE_OTHER): Payer: BLUE CROSS/BLUE SHIELD | Admitting: Family Medicine

## 2018-07-25 DIAGNOSIS — Z23 Encounter for immunization: Secondary | ICD-10-CM | POA: Diagnosis not present

## 2018-08-15 ENCOUNTER — Telehealth: Payer: Self-pay

## 2018-08-15 NOTE — Telephone Encounter (Signed)
According to CDC I do not think that you need two doses.   From CDC.  Certain adults may need 2 doses. Adults who are going to be in a setting that poses a high risk for measles or mumps transmission should make sure they have had two doses separated by at least 28 days. These adults include  students at post-high school education institutions healthcare personnel international travelers people who public health authorities determine are at increased risk during an outbreak

## 2018-08-15 NOTE — Telephone Encounter (Signed)
Rosan called and states she had 1 MMR and wanted to know if she needs the 2nd MMR. Please advise.

## 2018-08-16 NOTE — Telephone Encounter (Signed)
Called the patient left a message to call back. Irma Delancey,CMA

## 2018-08-19 ENCOUNTER — Other Ambulatory Visit: Payer: Self-pay | Admitting: Family Medicine

## 2018-08-19 DIAGNOSIS — J439 Emphysema, unspecified: Secondary | ICD-10-CM

## 2018-08-19 DIAGNOSIS — B001 Herpesviral vesicular dermatitis: Secondary | ICD-10-CM

## 2018-08-19 NOTE — Telephone Encounter (Signed)
Patient has been advised. Rhonda Cunningham,CMA  

## 2018-08-20 ENCOUNTER — Ambulatory Visit: Payer: BLUE CROSS/BLUE SHIELD | Admitting: Family Medicine

## 2018-08-21 ENCOUNTER — Ambulatory Visit (INDEPENDENT_AMBULATORY_CARE_PROVIDER_SITE_OTHER): Payer: BLUE CROSS/BLUE SHIELD | Admitting: Family Medicine

## 2018-08-21 ENCOUNTER — Encounter: Payer: Self-pay | Admitting: Family Medicine

## 2018-08-21 VITALS — BP 153/69 | HR 91 | Ht 64.0 in | Wt 177.0 lb

## 2018-08-21 DIAGNOSIS — F321 Major depressive disorder, single episode, moderate: Secondary | ICD-10-CM | POA: Diagnosis not present

## 2018-08-21 DIAGNOSIS — L853 Xerosis cutis: Secondary | ICD-10-CM

## 2018-08-21 DIAGNOSIS — E782 Mixed hyperlipidemia: Secondary | ICD-10-CM

## 2018-08-21 DIAGNOSIS — N951 Menopausal and female climacteric states: Secondary | ICD-10-CM | POA: Diagnosis not present

## 2018-08-21 DIAGNOSIS — I700 Atherosclerosis of aorta: Secondary | ICD-10-CM

## 2018-08-21 MED ORDER — TRIAMCINOLONE ACETONIDE 0.5 % EX CREA: 1.0000 "application " | TOPICAL_CREAM | Freq: Two times a day (BID) | CUTANEOUS | 3 refills | Status: DC

## 2018-08-21 MED ORDER — VENLAFAXINE HCL ER 75 MG PO CP24: 75.0000 mg | ORAL_CAPSULE | Freq: Every day | ORAL | 1 refills | Status: DC

## 2018-08-21 NOTE — Progress Notes (Signed)
Diana Solis is a 62 y.o. female who presents to [facility] today for follow-up hot flashes and hyperlipidemia.  Diana Solis was seen in June for hot flashes.  At that point she was thought to be postmenopausal and had a work-up that did show suppressed estrogen and progesterone levels and elevated FSH and LH.  We discussed the possibility of starting Effexor.  However she was somewhat lost to follow-up.  We also discussed black cohosh which she did not start.  She has bothersome hot flashes which wake her up from sleep and are quite obnoxious.  She is interested in taking medication now if possible.  Additionally as part of her work-up at the last visit she was found to have mildly elevated LDL.  She was taking 20 mg of atorvastatin at that time and LDL was 90.  She has a history of mild aortic atherosclerosis seen on chest x-ray and given that information her LDL goal is probably 70.  Increased atorvastatin to 40 mg and she tolerates it well with no muscle aches or pain.  She notes her mood is been worsening slightly.  Her beloved dog developed cancer and had to be put down on August 1.  The dog was 46 years old.  She notes this is very bothersome and devastating for her.  She also has some other life stressors and notes that she is been having slightly worsening mood recently.  She denies any SI or HI.  Rash: Patient notes a mildly crusted skin and rash on her extensor elbows bilaterally.  This is been present off and on for months and worsened slightly.  She thinks the dry air has something to do with it.  She has not tried any treatment yet.  ROS as above:  Exam:  BP (!) 153/69   Pulse 91   Ht 5\' 4"  (1.626 m)   Wt 177 lb (80.3 kg)   BMI 30.38 kg/m  Wt Readings from Last 5 Encounters:  08/21/18 177 lb (80.3 kg)  04/30/18 179 lb (81.2 kg)  03/18/18 176 lb (79.8 kg)    01/08/18 176 lb (79.8 kg)  12/18/17 178 lb (80.7 kg)    Gen: Well NAD HEENT: EOMI,  MMM Lungs: Normal work of breathing. CTABL Heart: RRR no MRG Abd: NABS, Soft. Nondistended, Nontender Exts: Brisk capillary refill, warm and well perfused.  Psych alert and oriented normal speech and thought process.  Affect somewhat tearful at times.  No SI or HI expressed. Skin: Slightly scaly thickened skin extensor elbows bilaterally nontender no erythema.  Lab and Radiology Results No results found for this or any previous visit (from the past 72 hour(s)). No results found.    Assessment and Plan: 62 y.o. female with  Hot flashes likely postmenopausal.  Discussed options.  Plan for trial of venlafaxine.  Will start at 75 mg extended release and recheck in 1 month.    Blood pressure mildly elevated today and will recheck blood pressure at that time if still elevated start medication.  Hyperlipidemia: Tolerating atorvastatin well continue current regimen  Rash elbows: Likely dry skin.  Trial of triamcinolone cream recheck in 1 month.  No orders of the defined types were placed in this encounter.  Meds ordered this encounter  Medications  . venlafaxine XR (EFFEXOR XR) 75 MG 24 hr capsule    Sig: Take 1 capsule (75 mg total) by mouth daily with breakfast.    Dispense:  30 capsule    Refill:  1  . triamcinolone cream (KENALOG) 0.5 %    Sig: Apply 1 application topically 2 (two) times daily. To affected areas.    Dispense:  30 g    Refill:  3     Historical information moved to improve visibility of documentation.  Past Medical History:  Diagnosis Date  . COPD (chronic obstructive pulmonary disease) ([facility])   . DIVERTICULOSIS, COLON 12/10/2010   Qualifier: Diagnosis of  By: Orson Aloe MD, Tinnie Gens    . GERD (gastroesophageal reflux disease)   . GLAUCOMA 09/29/2009   Qualifier: Diagnosis of  By: Linford Arnold MD, Santina Evans    . Hypothyroid   . Urinary incontinence 10/19/2011   Past  Surgical History:  Procedure Laterality Date  . BREAST CYST EXCISION    . INCONTINENCE SURGERY    . TONSILLECTOMY    . VAGINAL HYSTERECTOMY     Social History   Tobacco Use  . Smoking status: Former Smoker    Packs/day: 0.50    Years: 40.00    Pack years: 20.00    Types: Cigarettes  . Smokeless tobacco: Current User  . Tobacco comment: e-cig  Substance Use Topics  . Alcohol use: Yes    Alcohol/week: 6.0 standard drinks    Types: 6 Standard drinks or equivalent per week   family history includes Asthma in her mother; Lung cancer in her father; Lupus in her father; Thyroid disease in her mother.  Medications: Current Outpatient Medications  Medication Sig Dispense Refill  . albuterol (PROAIR HFA) 108 (90 Base) MCG/ACT inhaler Inhale 2 puffs into the lungs every 6 (six) hours as needed for wheezing or shortness of breath. 18 g 1  . atorvastatin (LIPITOR) 40 MG tablet Take 1 tablet (40 mg total) by mouth daily. 90 tablet 1  . BREO ELLIPTA 200-25 MCG/INH AEPB INHALE 1 PUFF BY MOUTH ONCE DAILY. 60 each 0  . Esomeprazole Magnesium (NEXIUM PO) Take by mouth daily.    Marland Kitchen ketoconazole (NIZORAL) 2 % cream Apply 1 application topically daily. To affected areas. 15 g 3  . levothyroxine (SYNTHROID, LEVOTHROID) 100 MCG tablet Take 1 tablet (100 mcg total) by mouth daily. 90 tablet 1  . LUMIGAN 0.01 % SOLN Place 1 drop into both eyes at bedtime.   0  . Multiple Vitamin (MULTIVITAMIN) tablet Take 1 tablet by mouth daily.    . polyethylene glycol powder (GLYCOLAX/MIRALAX) powder Take 17 g by mouth daily. 850 g 12  . SPIRIVA RESPIMAT 1.25 MCG/ACT AERS inhale 1 puff by mouth daily 4 g 12  . valACYclovir (VALTREX) 1000 MG tablet TAKE 1 TABLET BY MOUTH ONCE DAILY 90 tablet 0  . vitamin C (ASCORBIC ACID) 500 MG tablet Take 500 mg by mouth daily.    Marland Kitchen triamcinolone cream (KENALOG) 0.5 % Apply 1 application topically 2 (two) times daily. To affected areas. 30 g 3  . venlafaxine XR (EFFEXOR XR) 75 MG 24  hr capsule Take 1 capsule (75 mg total) by mouth daily with breakfast. 30 capsule 1   No current facility-administered medications for this visit.    No Known Allergies   Discussed warning signs or symptoms. Please see discharge instructions. Patient expresses understanding.

## 2018-08-21 NOTE — Patient Instructions (Addendum)
Thank you for coming in today. Start Effexor for hot flashes.  Apply the Triamolone cream to the skin on the elbows as needed.   Recheck hot flashes and mood in 1 month.  Return sooner if needed.   Menopause Menopause is the normal time of life when menstrual periods stop completely. Menopause is complete when you have missed 12 consecutive menstrual periods. It usually occurs between the ages of 48 years and 55 years. Very rarely does a woman develop menopause before the age of 40 years. At menopause, your ovaries stop producing the female hormones estrogen and progesterone. This can cause undesirable symptoms and also affect your health. Sometimes the symptoms may occur 4-5 years before the menopause begins. There is no relationship between menopause and:  Oral contraceptives.  Number of children you had.  Race.  The age your menstrual periods started (menarche).  Heavy smokers and very thin women may develop menopause earlier in life. What are the causes?  The ovaries stop producing the female hormones estrogen and progesterone. Other causes include:  Surgery to remove both ovaries.  The ovaries stop functioning for no known reason.  Tumors of the pituitary gland in the brain.  Medical disease that affects the ovaries and hormone production.  Radiation treatment to the abdomen or pelvis.  Chemotherapy that affects the ovaries.  What are the signs or symptoms?  Hot flashes.  Night sweats.  Decrease in sex drive.  Vaginal dryness and thinning of the vagina causing painful intercourse.  Dryness of the skin and developing wrinkles.  Headaches.  Tiredness.  Irritability.  Memory problems.  Weight gain.  Bladder infections.  Hair growth of the face and chest.  Infertility. More serious symptoms include:  Loss of bone (osteoporosis) causing breaks (fractures).  Depression.  Hardening and narrowing of the arteries (atherosclerosis) causing heart attacks  and strokes.  How is this diagnosed?  When the menstrual periods have stopped for 12 straight months.  Physical exam.  Hormone studies of the blood. How is this treated? There are many treatment choices and nearly as many questions about them. The decisions to treat or not to treat menopausal changes is an individual choice made with your health care provider. Your health care provider can discuss the treatments with you. Together, you can decide which treatment will work best for you. Your treatment choices may include:  Hormone therapy (estrogen and progesterone).  Non-hormonal medicines.  Treating the individual symptoms with medicine (for example antidepressants for depression).  Herbal medicines that may help specific symptoms.  Counseling by a psychiatrist or psychologist.  Group therapy.  Lifestyle changes including: ? Eating healthy. ? Regular exercise. ? Limiting caffeine and alcohol. ? Stress management and meditation.  No treatment.  Follow these instructions at home:  Take the medicine your health care provider gives you as directed.  Get plenty of sleep and rest.  Exercise regularly.  Eat a diet that contains calcium (good for the bones) and soy products (acts like estrogen hormone).  Avoid alcoholic beverages.  Do not smoke.  If you have hot flashes, dress in layers.  Take supplements, calcium, and vitamin D to strengthen bones.  You can use over-the-counter lubricants or moisturizers for vaginal dryness.  Group therapy is sometimes very helpful.  Acupuncture may be helpful in some cases. Contact a health care provider if:  You are not sure you are in menopause.  You are having menopausal symptoms and need advice and treatment.  You are still having menstrual periods after  age 1 years.  You have pain with intercourse.  Menopause is complete (no menstrual period for 12 months) and you develop vaginal bleeding.  You need a referral to a  specialist (gynecologist, psychiatrist, or psychologist) for treatment. Get help right away if:  You have severe depression.  You have excessive vaginal bleeding.  You fell and think you have a broken bone.  You have pain when you urinate.  You develop leg or chest pain.  You have a fast pounding heart beat (palpitations).  You have severe headaches.  You develop vision problems.  You feel a lump in your breast.  You have abdominal pain or severe indigestion. This information is not intended to replace advice given to you by your health care provider. Make sure you discuss any questions you have with your health care provider. Document Released: 01/20/2004 Document Revised: 04/06/2016 Document Reviewed: 05/29/2013 Elsevier Interactive Patient Education  2017 [employer].

## 2018-09-05 ENCOUNTER — Other Ambulatory Visit: Payer: Self-pay | Admitting: Family Medicine

## 2018-09-17 ENCOUNTER — Other Ambulatory Visit: Payer: Self-pay | Admitting: Family Medicine

## 2018-09-17 DIAGNOSIS — J439 Emphysema, unspecified: Secondary | ICD-10-CM

## 2018-09-18 ENCOUNTER — Ambulatory Visit: Payer: BLUE CROSS/BLUE SHIELD | Admitting: Family Medicine

## 2018-10-13 ENCOUNTER — Other Ambulatory Visit: Payer: Self-pay | Admitting: Family Medicine

## 2018-10-13 DIAGNOSIS — J439 Emphysema, unspecified: Secondary | ICD-10-CM

## 2018-10-28 ENCOUNTER — Emergency Department (INDEPENDENT_AMBULATORY_CARE_PROVIDER_SITE_OTHER)
Admission: EM | Admit: 2018-10-28 | Discharge: 2018-10-28 | Disposition: A | Discharge status: Home / Self Care | Payer: BLUE CROSS/BLUE SHIELD | Source: Home / Self Care | Attending: Family Medicine | Admitting: Family Medicine

## 2018-10-28 ENCOUNTER — Other Ambulatory Visit: Payer: Self-pay

## 2018-10-28 ENCOUNTER — Encounter: Payer: Self-pay | Admitting: *Deleted

## 2018-10-28 DIAGNOSIS — J439 Emphysema, unspecified: Secondary | ICD-10-CM

## 2018-10-28 DIAGNOSIS — N3000 Acute cystitis without hematuria: Secondary | ICD-10-CM | POA: Diagnosis not present

## 2018-10-28 DIAGNOSIS — J069 Acute upper respiratory infection, unspecified: Secondary | ICD-10-CM

## 2018-10-28 DIAGNOSIS — R300 Dysuria: Secondary | ICD-10-CM

## 2018-10-28 DIAGNOSIS — J441 Chronic obstructive pulmonary disease with (acute) exacerbation: Secondary | ICD-10-CM

## 2018-10-28 DIAGNOSIS — B9789 Other viral agents as the cause of diseases classified elsewhere: Secondary | ICD-10-CM

## 2018-10-28 MED ORDER — METHYLPREDNISOLONE ACETATE 80 MG/ML IJ SUSP
80.0000 mg | Freq: Once | INTRAMUSCULAR | Status: AC
  Administered 2018-10-28: 80 mg via INTRAMUSCULAR

## 2018-10-28 MED ORDER — AMOXICILLIN 875 MG PO TABS: 875.0000 mg | ORAL_TABLET | Freq: Two times a day (BID) | ORAL | 0 refills | Status: DC

## 2018-10-28 MED ORDER — ALBUTEROL SULFATE HFA 108 (90 BASE) MCG/ACT IN AERS: 2.0000 | INHALATION_SPRAY | Freq: Four times a day (QID) | RESPIRATORY_TRACT | 1 refills | Status: DC | PRN

## 2018-10-28 NOTE — Discharge Instructions (Addendum)
Take plain guaifenesin (1200mg  extended release tabs such as Mucinex) twice daily, with plenty of water, for cough and congestion.  May add Pseudoephedrine (30mg , one or two every 4 to 6 hours) for sinus congestion.  Get adequate rest.   May use Afrin nasal spray (or generic oxymetazoline) each morning for about 5 days and then discontinue.  Also recommend using saline nasal spray several times daily and saline nasal irrigation (AYR is a common brand).   Try warm salt water gargles for sore throat.  May take Delsym Cough Suppressant at bedtime for nighttime cough.  Stop all antihistamines for now, and other non-prescription cough/cold preparations. Continue Breo Ellipta and Spiriva inhalers

## 2018-10-28 NOTE — ED Provider Notes (Signed)
[name]    CSN: [id number] Arrival date & time: 10/28/18  1208     History   Chief Complaint Chief Complaint  Patient presents with  . Nasal Congestion  . Dysuria    HPI Diana Solis is a 62 y.o. female.   Patient complains of five day history of typical cold-like symptoms developing over several days, including mild sore throat, sinus congestion, fatigue, and cough.  Approximately one week ago she had developed dysuria and frequency that has persisted.  She has COPD and has increased her use of her albuterol inhaler.  She also uses Spiriva and Breo Ellipta.  The history is provided by the patient.    Past Medical History:  Diagnosis Date  . COPD (chronic obstructive pulmonary disease) (HCC)   . DIVERTICULOSIS, COLON 12/10/2010   Qualifier: Diagnosis of  By: Orson Aloe MD, Tinnie Gens    . GERD (gastroesophageal reflux disease)   . GLAUCOMA 09/29/2009   Qualifier: Diagnosis of  By: Linford Arnold MD, Santina Evans    . 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 Hypothyroid   . Urinary incontinence 10/19/2011    Patient Active Problem List   Diagnosis Date Noted  . Dysphasia 04/30/2018  . HLD (hyperlipidemia) 01/08/2018  . Fatigue 12/18/2017  . MDD (major depressive disorder) 12/18/2017  . Arteriosclerosis of abdominal aorta (HCC) 05/07/2017  . Constipation 03/22/2017  . Ventral hernia 08/28/2016  . Prediabetes 07/27/2016  . Nonspecific abnormal electrocardiogram (ECG) (EKG) 07/24/2016  . OSA (obstructive sleep apnea) 09/01/2015  . Hypothyroidism 06/01/2015  . Hypersomnia 03/09/2015  . Chronic obstructive pulmonary disease (HCC) 03/23/2014  . Spastic colon 10/19/2011  . Urinary incontinence 10/19/2011  . DIVERTICULOSIS, COLON 12/10/2010  . OTHER SPECIFIED DISORDERS OF THYROID 10/13/2010  . GLAUCOMA 09/29/2009    Past Surgical History:  Procedure Laterality Date  . BREAST CYST EXCISION    . INCONTINENCE SURGERY    . TONSILLECTOMY    . VAGINAL HYSTERECTOMY       OB History   No obstetric history on file.      Home Medications    Prior to Admission medications   Medication Sig Start Date End Date Taking? Authorizing Provider  albuterol (PROAIR HFA) 108 (90 Base) MCG/ACT inhaler Inhale 2 puffs into the lungs every 6 (six) hours as needed for wheezing or shortness of breath. 10/28/18   Lattie Haw, MD  amoxicillin (AMOXIL) 875 MG tablet Take 1 tablet (875 mg total) by mouth 2 (two) times daily. 10/28/18   Lattie Haw, MD  atorvastatin (LIPITOR) 40 MG tablet Take 1 tablet (40 mg total) by mouth daily. 05/09/18   Rodolph Bong, MD  BREO ELLIPTA [id number] MCG/INH AEPB INHALE 1 PUFF BY MOUTH EVERY DAY 10/14/18   Rodolph Bong, MD  Esomeprazole Magnesium (NEXIUM PO) Take by mouth daily.    [provider]  ketoconazole (NIZORAL) 2 % cream Apply 1 application topically daily. To affected areas. 01/08/18   Rodolph Bong, MD  levothyroxine (SYNTHROID, LEVOTHROID) 100 MCG tablet TAKE 1 TABLET BY MOUTH DAILY 09/06/18   Rodolph Bong, MD  LUMIGAN 0.01 % SOLN Place 1 drop into both eyes at bedtime.  07/21/16   [provider]  Multiple Vitamin (MULTIVITAMIN) tablet Take 1 tablet by mouth daily.    [provider]  polyethylene glycol powder (GLYCOLAX/MIRALAX) powder Take 17 g by mouth daily. 03/22/17   Rodolph Bong, MD  SPIRIVA RESPIMAT 1.25 MCG/ACT AERS inhale 1 puff by mouth daily 03/22/18  Rodolph Bongorey, Evan S, MD  triamcinolone cream (KENALOG) 0.5 % Apply 1 application topically 2 (two) times daily. To affected areas. 08/21/18   Rodolph Bongorey, Evan S, MD  valACYclovir (VALTREX) 1000 MG tablet TAKE 1 TABLET BY MOUTH ONCE DAILY 08/19/18   Rodolph Bongorey, Evan S, MD  venlafaxine XR (EFFEXOR XR) 75 MG 24 hr capsule Take 1 capsule (75 mg total) by mouth daily with breakfast. 08/21/18   Rodolph Bongorey, Evan S, MD  vitamin C (ASCORBIC ACID) 500 MG tablet Take 500 mg by mouth daily.    [provider]    Family History Family History  Problem Relation Age  of Onset  . Asthma Mother   . Thyroid disease Mother   . Lung cancer Father        smoker  . Lupus Father     Social History Social History   Tobacco Use  . Smoking status: Former Smoker    Packs/day: 0.50    Years: 40.00    Pack years: 20.00    Types: Cigarettes  . Smokeless tobacco: Current User  . Tobacco comment: e-cig  Substance Use Topics  . Alcohol use: Yes    Alcohol/week: 6.0 standard drinks    Types: 6 Standard drinks or equivalent per week  . Drug use: No     Allergies   Patient has no known allergies.   Review of Systems Review of Systems + sore throat + cough No pleuritic pain No wheezing + nasal congestion + post-nasal drainage + sinus pain/pressure No itchy/red eyes ? earache No hemoptysis No SOB No fever, + chills No nausea No vomiting + lower abdominal pain No diarrhea + dysuria/frequency No skin rash + fatigue No myalgias + headache Used OTC meds without relief   Physical Exam Triage Vital Signs ED Triage Vitals  Enc Vitals Group     BP 10/28/18 1305 116/62     Pulse Rate 10/28/18 1305 64     Resp 10/28/18 1305 18     Temp 10/28/18 1305 97.6 F (36.4 C)     Temp Source 10/28/18 1305 Oral     SpO2 10/28/18 1305 94 %     Weight 10/28/18 1306 178 lb (80.7 kg)     Height 10/28/18 1306 5\' 4"  (1.626 m)     Head Circumference --      Peak Flow --      Pain Score 10/28/18 1306 0     Pain Loc --      Pain Edu? --      Excl. in GC? --    No data found.  Updated Vital Signs BP 116/62 (BP Location: Right Arm)   Pulse 64   Temp 97.6 F (36.4 C) (Oral)   Resp 18   Ht 5\' 4"  (1.626 m)   Wt 80.7 kg   SpO2 94%   BMI 30.55 kg/m   Visual Acuity Right Eye Distance:   Left Eye Distance:   Bilateral Distance:    Right Eye Near:   Left Eye Near:    Bilateral Near:     Physical Exam Nursing notes and Vital Signs reviewed. Appearance:  Patient appears stated age, and in no acute distress Eyes:  Pupils are equal, round, and  reactive to light and accomodation.  Extraocular movement is intact.  Conjunctivae are not inflamed  Ears:  Canals normal.  Tympanic membranes normal.  Nose:  Mildly congested turbinates.  No sinus tenderness.   Pharynx:  Normal Neck:  Supple.  Enlarged posterior/lateral nodes are  palpated bilaterally, tender to palpation on the left.   Lungs:  Clear to auscultation.  Breath sounds are equal.  Moving air well. Heart:  Regular rate and rhythm without murmurs, rubs, or gallops.  Abdomen:  Nontender without masses or hepatosplenomegaly.  Bowel sounds are present.  No CVA or flank tenderness.  Extremities:  No edema.  Skin:  No rash present.    UC Treatments / Results  Labs (all labs ordered are listed, but only abnormal results are displayed) Labs Reviewed  URINE CULTURE  POCT URINALYSIS DIP (MANUAL ENTRY):  LEU Small, otherwise negative.    EKG None  Radiology No results found.  Procedures Procedures (including critical care time)  Medications Ordered in UC Medications  methylPREDNISolone acetate (DEPO-MEDROL) injection 80 mg (has no administration in time range)    Initial Impression / Assessment and Plan / UC Course  I have reviewed the triage vital signs and the nursing notes.  Pertinent labs & imaging results that were available during my care of the patient were reviewed by me and considered in my medical decision making (see chart for details).    Begin amoxicillin to cover possible bacterial bronchitis and cystitis.  Urine culture pending. Administered Depo Medrol 80mg  IM.  Refill albuterol inhaler. Followup with Family Doctor if not improved in 7 to 10 days.   Final Clinical Impressions(s) / UC Diagnoses   Final diagnoses:  Dysuria  Viral URI with cough  COPD exacerbation (HCC)  Acute cystitis without hematuria     Discharge Instructions     Take plain guaifenesin (1200mg  extended release tabs such as Mucinex) twice daily, with plenty of water, for cough  and congestion.  May add Pseudoephedrine (30mg , one or two every 4 to 6 hours) for sinus congestion.  Get adequate rest.   May use Afrin nasal spray (or generic oxymetazoline) each morning for about 5 days and then discontinue.  Also recommend using saline nasal spray several times daily and saline nasal irrigation (AYR is a common brand).   Try warm salt water gargles for sore throat.  May take Delsym Cough Suppressant at bedtime for nighttime cough.  Stop all antihistamines for now, and other non-prescription cough/cold preparations. Continue Breo Ellipta and Spiriva inhalers    ED Prescriptions    Medication Sig Dispense Auth. Provider   albuterol (PROAIR HFA) 108 (90 Base) MCG/ACT inhaler Inhale 2 puffs into the lungs every 6 (six) hours as needed for wheezing or shortness of breath. 18 g Lattie Haw, MD   amoxicillin (AMOXIL) 875 MG tablet Take 1 tablet (875 mg total) by mouth 2 (two) times daily. 20 tablet Lattie Haw, MD        Lattie Haw, MD 10/28/18 [phone]

## 2018-10-28 NOTE — ED Triage Notes (Signed)
Pt c/o nasal congestion, sinuses burning and bilateral ear ache x 3 days. She also c/o lower abd pain and dysuria x 1 wk.

## 2018-10-29 LAB — URINE CULTURE
MICRO NUMBER:: 91502296
SPECIMEN QUALITY:: ADEQUATE

## 2018-10-30 ENCOUNTER — Telehealth: Payer: Self-pay

## 2018-10-30 NOTE — Telephone Encounter (Signed)
Spoke with patient, feeling about the same.  Gave lab results.  Will follow up as needed.

## 2018-11-14 ENCOUNTER — Other Ambulatory Visit: Payer: Self-pay | Admitting: Family Medicine

## 2018-11-14 DIAGNOSIS — J439 Emphysema, unspecified: Secondary | ICD-10-CM

## 2018-12-09 ENCOUNTER — Other Ambulatory Visit: Payer: Self-pay | Admitting: Family Medicine

## 2018-12-09 DIAGNOSIS — B001 Herpesviral vesicular dermatitis: Secondary | ICD-10-CM

## 2018-12-13 ENCOUNTER — Other Ambulatory Visit: Payer: Self-pay | Admitting: Family Medicine

## 2018-12-13 DIAGNOSIS — J439 Emphysema, unspecified: Secondary | ICD-10-CM

## 2019-01-15 ENCOUNTER — Other Ambulatory Visit: Payer: Self-pay | Admitting: Family Medicine

## 2019-01-15 DIAGNOSIS — J439 Emphysema, unspecified: Secondary | ICD-10-CM

## 2019-02-05 ENCOUNTER — Other Ambulatory Visit: Payer: Self-pay

## 2019-02-05 ENCOUNTER — Ambulatory Visit (INDEPENDENT_AMBULATORY_CARE_PROVIDER_SITE_OTHER): Payer: BLUE CROSS/BLUE SHIELD | Admitting: Family Medicine

## 2019-02-05 DIAGNOSIS — F320 Major depressive disorder, single episode, mild: Secondary | ICD-10-CM | POA: Diagnosis not present

## 2019-02-05 DIAGNOSIS — E039 Hypothyroidism, unspecified: Secondary | ICD-10-CM | POA: Diagnosis not present

## 2019-02-05 DIAGNOSIS — R300 Dysuria: Secondary | ICD-10-CM

## 2019-02-05 DIAGNOSIS — J439 Emphysema, unspecified: Secondary | ICD-10-CM | POA: Diagnosis not present

## 2019-02-05 DIAGNOSIS — I700 Atherosclerosis of aorta: Secondary | ICD-10-CM

## 2019-02-05 MED ORDER — FLUTICASONE FUROATE-VILANTEROL 200-25 MCG/INH IN AEPB: 1.0000 | INHALATION_SPRAY | Freq: Every day | RESPIRATORY_TRACT | 3 refills | Status: DC

## 2019-02-05 MED ORDER — LEVOTHYROXINE SODIUM 100 MCG PO TABS: 100.0000 ug | ORAL_TABLET | Freq: Every day | ORAL | 1 refills | Status: DC

## 2019-02-05 MED ORDER — CEFDINIR 300 MG PO CAPS: 300.0000 mg | ORAL_CAPSULE | Freq: Two times a day (BID) | ORAL | 0 refills | Status: DC

## 2019-02-05 NOTE — Progress Notes (Addendum)
Virtual Visit  via Video or Phone Note  I connected with      Diana Solis  by a telemedicine application and verified that I am speaking with the correct person using two identifiers.   I discussed the limitations of evaluation and management by telemedicine and the availability of in person appointments. The patient expressed understanding and agreed to proceed.  History of Present Illness: Diana Solis is a 63 y.o. female who would like to discuss dysuria and urinary tract infection symptoms.  Patient has a one-week history of urinary frequency urgency and dysuria with pain radiating to the low back.  Symptoms are consistent with previous episodes of UTI.  She denies any blood in the urine fevers or chills nausea vomiting or diarrhea.  She not tried any treatment yet.  She would very much like to avoid going to the clinic or lab to avoid exposure to COVID-19.  She additionally has a history of COPD.  She notes this is pretty well controlled with Breo and Spiriva.  She needs a refill of Breo.  She feels well otherwise.  She notes her mood has been reasonably stable recently.  She has a history of depression.  She is had several traumas recently.  Her mom died last year her mother-in-law died a few months ago and her dog died recently.  She notes extra stressors but feels like she is doing okay.  She was prescribed Effexor but never started it.      Observations/Objective: There were no vitals taken for this visit. Wt Readings from Last 5 Encounters:  10/28/18 178 lb (80.7 kg)  08/21/18 177 lb (80.3 kg)  04/30/18 179 lb (81.2 kg)  03/18/18 176 lb (79.8 kg)  01/08/18 176 lb (79.8 kg)   Exam: Normal Speech.  No shortness of breath hoarse voice or stridor or wheeze. Psych alert and oriented normal speech thought process and verbal affect.  No SI or HI expressed.  [facility] 02/05/2019 03/18/2018 12/18/2017  Decreased Interest 2 1 2   Down, Depressed, Hopeless 0 1 1   PHQ - 2 Score 2 2 3   Altered sleeping 1 1 2   Tired, decreased energy 2 1 3   Change in appetite 0 0 0  Feeling bad or failure about yourself  0 0 0  Trouble concentrating 0 0 0  Moving slowly or fidgety/restless 0 0 -  Suicidal thoughts 0 0 0  PHQ-9 Score 5 4 8   Difficult doing work/chores Not difficult at all Not difficult at all Somewhat difficult       Assessment and Plan: 63 y.o. female with  Dysuria.  Symptoms consistent with urinary tract infection.  Ideally would like to obtain a culture however patient would very much like to avoid the lab if possible.  Plan for empiric treatment with Omnicef and watchful waiting.  Report back if not improving.  COPD: Stable but very vulnerable especially to COVID-19 infection.  Remains socially isolated and avoid contact if possible.  Refill Breo.  Refill levothyroxine for hypothyroidism.  PDMP not reviewed this encounter. No orders of the defined types were placed in this encounter.  Meds ordered this encounter  Medications  . fluticasone furoate-vilanterol (BREO ELLIPTA) 200-25 MCG/INH AEPB    Sig: Inhale 1 puff into the lungs daily.    Dispense:  90 each    Refill:  3  . levothyroxine (SYNTHROID, LEVOTHROID) 100 MCG tablet    Sig: Take 1 tablet (100 mcg total) by mouth daily.  Dispense:  90 tablet    Refill:  1  . cefdinir (OMNICEF) 300 MG capsule    Sig: Take 1 capsule (300 mg total) by mouth 2 (two) times daily.    Dispense:  14 capsule    Refill:  0    Follow Up Instructions:    I discussed the assessment and treatment plan with the patient. The patient was provided an opportunity to ask questions and all were answered. The patient agreed with the plan and demonstrated an understanding of the instructions.   The patient was advised to call back or seek an in-person evaluation if the symptoms worsen or if the condition fails to improve as anticipated.  I provided 25 minutes of non-face-to-face time during this  encounter.    Historical information moved to improve visibility of documentation.  Past Medical History:  Diagnosis Date  . COPD (chronic obstructive pulmonary disease) (HCC)   . DIVERTICULOSIS, COLON 12/10/2010   Qualifier: Diagnosis of  By: Orson Aloe MD, Tinnie Gens    . GERD (gastroesophageal reflux disease)   . GLAUCOMA 09/29/2009   Qualifier: Diagnosis of  By: Linford Arnold MD, Santina Evans    . 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 Hypothyroid   . Urinary incontinence 10/19/2011   Past Surgical History:  Procedure Laterality Date  . BREAST CYST EXCISION    . INCONTINENCE SURGERY    . TONSILLECTOMY    . VAGINAL HYSTERECTOMY     Social History   Tobacco Use  . Smoking status: Former Smoker    Packs/day: 0.50    Years: 40.00    Pack years: 20.00    Types: Cigarettes  . Smokeless tobacco: Current User  . Tobacco comment: e-cig  Substance Use Topics  . Alcohol use: Yes    Alcohol/week: 6.0 standard drinks    Types: 6 Standard drinks or equivalent per week   family history includes Asthma in her mother; Lung cancer in her father; Lupus in her father; Thyroid disease in her mother.  Medications: Current Outpatient Medications  Medication Sig Dispense Refill  . albuterol (PROAIR HFA) 108 (90 Base) MCG/ACT inhaler Inhale 2 puffs into the lungs every 6 (six) hours as needed for wheezing or shortness of breath. 18 g 1  . atorvastatin (LIPITOR) 40 MG tablet TAKE 1 TABLET BY MOUTH ONCE DAILY 90 tablet 1  . cefdinir (OMNICEF) 300 MG capsule Take 1 capsule (300 mg total) by mouth 2 (two) times daily. 14 capsule 0  . Esomeprazole Magnesium (NEXIUM PO) Take by mouth daily.    . fluticasone furoate-vilanterol (BREO ELLIPTA) 200-25 MCG/INH AEPB Inhale 1 puff into the lungs daily. 90 each 3  . ketoconazole (NIZORAL) 2 % cream Apply 1 application topically daily. To affected areas. 15 g 3  . levothyroxine (SYNTHROID, LEVOTHROID) 100 MCG tablet Take 1 tablet (100 mcg total) by mouth daily. 90  tablet 1  . LUMIGAN 0.01 % SOLN Place 1 drop into both eyes at bedtime.   0  . Multiple Vitamin (MULTIVITAMIN) tablet Take 1 tablet by mouth daily.    . polyethylene glycol powder (GLYCOLAX/MIRALAX) powder Take 17 g by mouth daily. 850 g 12  . SPIRIVA RESPIMAT 1.25 MCG/ACT AERS inhale 1 puff by mouth daily 4 g 12  . triamcinolone cream (KENALOG) 0.5 % Apply 1 application topically 2 (two) times daily. To affected areas. 30 g 3  . valACYclovir (VALTREX) 1000 MG tablet TAKE 1 TABLET BY MOUTH ONCE DAILY 90 tablet 0  . vitamin C (ASCORBIC ACID)  500 MG tablet Take 500 mg by mouth daily.     No current facility-administered medications for this visit.    No Known Allergies  PDMP not reviewed this encounter. No orders of the defined types were placed in this encounter.  Meds ordered this encounter  Medications  . fluticasone furoate-vilanterol (BREO ELLIPTA) 200-25 MCG/INH AEPB    Sig: Inhale 1 puff into the lungs daily.    Dispense:  90 each    Refill:  3  . levothyroxine (SYNTHROID, LEVOTHROID) 100 MCG tablet    Sig: Take 1 tablet (100 mcg total) by mouth daily.    Dispense:  90 tablet    Refill:  1  . cefdinir (OMNICEF) 300 MG capsule    Sig: Take 1 capsule (300 mg total) by mouth 2 (two) times daily.    Dispense:  14 capsule    Refill:  0    Addendum to correct incorrect date due to templating error

## 2019-03-12 ENCOUNTER — Telehealth: Payer: Self-pay | Admitting: Family Medicine

## 2019-03-12 NOTE — Telephone Encounter (Signed)
Patient appears to be due for mammogram.  Please contact her and ask her if she is had mammogram outside of our system and if not she should have repeat mammogram.

## 2019-03-12 NOTE — Telephone Encounter (Signed)
Left pt msg on ID'd VM to call with mammogram update

## 2019-03-13 ENCOUNTER — Other Ambulatory Visit: Payer: Self-pay | Admitting: Family Medicine

## 2019-03-13 DIAGNOSIS — B001 Herpesviral vesicular dermatitis: Secondary | ICD-10-CM

## 2019-03-13 NOTE — Telephone Encounter (Signed)
RX was sent 12/09/18 for #90. Just wanted to make sure pt is to be taking this daily before sending refill.  Please advise. Thanks!

## 2019-04-17 ENCOUNTER — Other Ambulatory Visit: Payer: Self-pay | Admitting: Family Medicine

## 2019-04-21 ENCOUNTER — Other Ambulatory Visit: Payer: Self-pay | Admitting: Family Medicine

## 2019-04-21 NOTE — Telephone Encounter (Addendum)
Patient called about medication concerns.   1.Spirivia was prescribed only 1 puff a day. Pt need that changed to 2 puffs.  2.Need a refill on miralax.  Please advise.

## 2019-04-22 NOTE — Addendum Note (Signed)
Addended by: Narda Rutherford on: 04/22/2019 07:19 AM   Modules accepted: Orders

## 2019-04-23 MED ORDER — TIOTROPIUM BROMIDE MONOHYDRATE 1.25 MCG/ACT IN AERS: 2.0000 | INHALATION_SPRAY | Freq: Every day | RESPIRATORY_TRACT | 12 refills | Status: DC

## 2019-04-23 MED ORDER — POLYETHYLENE GLYCOL 3350 17 GM/SCOOP PO POWD: 17.0000 g | Freq: Every day | ORAL | 12 refills | Status: DC

## 2019-05-19 ENCOUNTER — Other Ambulatory Visit: Payer: Self-pay | Admitting: Family Medicine

## 2019-06-20 ENCOUNTER — Other Ambulatory Visit: Payer: Self-pay | Admitting: Family Medicine

## 2019-06-27 ENCOUNTER — Telehealth: Payer: Self-pay | Admitting: Family Medicine

## 2019-06-27 DIAGNOSIS — E039 Hypothyroidism, unspecified: Secondary | ICD-10-CM

## 2019-06-27 DIAGNOSIS — I700 Atherosclerosis of aorta: Secondary | ICD-10-CM

## 2019-06-27 DIAGNOSIS — E782 Mixed hyperlipidemia: Secondary | ICD-10-CM

## 2019-06-27 NOTE — Telephone Encounter (Signed)
Patient called and want to know if she can get her labs for her TSH and Lipid checked before she has a virtula visit with you since its been more than 6 months since you last seen her. Please advise.

## 2019-06-30 NOTE — Telephone Encounter (Signed)
Labs ordered  Should be done fasting

## 2019-06-30 NOTE — Telephone Encounter (Signed)
Left pt msg advising labs ordered and should be done fasting

## 2019-07-04 LAB — COMPLETE METABOLIC PANEL WITH GFR
AG Ratio: 1.9 (calc) (ref 1.0–2.5)
ALT: 27 U/L (ref 6–29)
AST: 23 U/L (ref 10–35)
Albumin: 4.6 g/dL (ref 3.6–5.1)
Alkaline phosphatase (APISO): 124 U/L (ref 37–153)
BUN: 14 mg/dL (ref 7–25)
CO2: 29 mmol/L (ref 20–32)
Calcium: 10.2 mg/dL (ref 8.6–10.4)
Chloride: 101 mmol/L (ref 98–110)
Creat: 0.73 mg/dL (ref 0.50–0.99)
GFR, Est African American: 102 mL/min/{1.73_m2} (ref >=60)
GFR, Est Non African American: 88 mL/min/{1.73_m2} (ref >=60)
Globulin: 2.4 g/dL (calc) (ref 1.9–3.7)
Glucose, Bld: 108 mg/dL — ABNORMAL HIGH (ref 65–99)
Potassium: 4.8 mmol/L (ref 3.5–5.3)
Sodium: 139 mmol/L (ref 135–146)
Total Bilirubin: 0.6 mg/dL (ref 0.2–1.2)
Total Protein: 7 g/dL (ref 6.1–8.1)

## 2019-07-04 LAB — LIPID PANEL W/REFLEX DIRECT LDL
Cholesterol: 178 mg/dL (ref <200)
HDL: 74 mg/dL (ref >=50)
LDL Cholesterol (Calc): 88 mg/dL (calc)
Non-HDL Cholesterol (Calc): 104 mg/dL (calc) (ref <130)
Total CHOL/HDL Ratio: 2.4 (calc) (ref <5.0)
Triglycerides: 70 mg/dL (ref <150)

## 2019-07-04 LAB — CBC
HCT: 40.4 % (ref 35.0–45.0)
Hemoglobin: 13.8 g/dL (ref 11.7–15.5)
MCH: 31.7 pg (ref 27.0–33.0)
MCHC: 34.2 g/dL (ref 32.0–36.0)
MCV: 92.9 fL (ref 80.0–100.0)
MPV: 9.8 fL (ref 7.5–12.5)
Platelets: 261 10*3/uL (ref 140–400)
RBC: 4.35 10*6/uL (ref 3.80–5.10)
RDW: 12.6 % (ref 11.0–15.0)
WBC: 5 10*3/uL (ref 3.8–10.8)

## 2019-07-04 LAB — TSH: TSH: 1.56 mIU/L (ref 0.40–4.50)

## 2019-07-09 ENCOUNTER — Other Ambulatory Visit: Payer: Self-pay | Admitting: Family Medicine

## 2019-07-09 ENCOUNTER — Ambulatory Visit (INDEPENDENT_AMBULATORY_CARE_PROVIDER_SITE_OTHER): Payer: BC Managed Care – PPO | Admitting: Family Medicine

## 2019-07-09 DIAGNOSIS — Z23 Encounter for immunization: Secondary | ICD-10-CM | POA: Diagnosis not present

## 2019-07-15 ENCOUNTER — Telehealth: Payer: Self-pay | Admitting: Family Medicine

## 2019-07-15 MED ORDER — ATORVASTATIN CALCIUM 40 MG PO TABS: 40.0000 mg | ORAL_TABLET | Freq: Every day | ORAL | 3 refills | Status: DC

## 2019-07-15 NOTE — Telephone Encounter (Signed)
Refill atorvastatin 90 pills x 3 refills

## 2019-08-20 ENCOUNTER — Other Ambulatory Visit: Payer: Self-pay | Admitting: Family Medicine

## 2019-10-02 ENCOUNTER — Ambulatory Visit (INDEPENDENT_AMBULATORY_CARE_PROVIDER_SITE_OTHER): Payer: BC Managed Care – PPO | Admitting: Family Medicine

## 2019-10-02 ENCOUNTER — Encounter: Payer: Self-pay | Admitting: Family Medicine

## 2019-10-02 VITALS — Temp 97.6°F

## 2019-10-02 DIAGNOSIS — R519 Headache, unspecified: Secondary | ICD-10-CM | POA: Diagnosis not present

## 2019-10-02 MED ORDER — AZITHROMYCIN 250 MG PO TABS: ORAL_TABLET | ORAL | 0 refills | Status: AC

## 2019-10-02 NOTE — Progress Notes (Signed)
Virtual Visit via Telephone Note  I connected with Diana Solis on 10/02/19 at  4:20 PM EST by telephone and verified that I am speaking with the correct person using two identifiers.   I discussed the limitations, risks, security and privacy concerns of performing an evaluation and management service by telephone and the availability of in person appointments. I also discussed with the patient that there may be a patient responsible charge related to this service. The patient expressed understanding and agreed to proceed.   Subjective:    CC: Sinus issues  HPI:   C/O of 3 weeks of sinus sx. No major congestion but has had HA and pressure above her eyes and top of her head and left ear.  Did a virtual visit with her health insurance and was given 10 days of amoxicillin.  Says her sxs started after being outside near a woodfire.  She hasn't otherwise been out of her ohome. No known sick contacts and no known exposure to [place].  She says the pressure is severe.  No GI sxs. No sore throat.  No GI sxs.  No fever, chills ore sw  She denies any new medications, decongestants or supplements.      Past medical history, Surgical history, Family history not pertinant except as noted below, Social history, Allergies, and medications have been entered into the medical record, reviewed, and corrections made.   Review of Systems: No fevers, chills, night sweats, weight loss, chest pain, or shortness of breath.   Objective:    General: Speaking clearly in complete sentences without any shortness of breath.  Alert and oriented x3.  Normal judgment. No apparent acute distress.    Impression and Recommendations:    Headache/sinus pressure - will try 2nd round of ABX. Will tx with zpack.  If not improving then may be other underlying cause. Call if nto bette in one week.  comtinue allergy medication.        I discussed the assessment and treatment plan with the patient. The patient was provided an  opportunity to ask questions and all were answered. The patient agreed with the plan and demonstrated an understanding of the instructions.   The patient was advised to call back or seek an in-person evaluation if the symptoms worsen or if the condition fails to improve as anticipated.  I provided 18 minutes of non-face-to-face time during this encounter.   Beatrice Lecher, MD

## 2019-10-08 ENCOUNTER — Ambulatory Visit: Payer: BC Managed Care – PPO | Admitting: Physician Assistant

## 2019-10-18 ENCOUNTER — Other Ambulatory Visit: Payer: Self-pay | Admitting: Family Medicine

## 2019-10-18 DIAGNOSIS — B001 Herpesviral vesicular dermatitis: Secondary | ICD-10-CM

## 2019-12-24 ENCOUNTER — Telehealth (INDEPENDENT_AMBULATORY_CARE_PROVIDER_SITE_OTHER): Payer: BC Managed Care – PPO | Admitting: Family Medicine

## 2019-12-24 ENCOUNTER — Encounter: Payer: Self-pay | Admitting: Family Medicine

## 2019-12-24 DIAGNOSIS — J0100 Acute maxillary sinusitis, unspecified: Secondary | ICD-10-CM | POA: Diagnosis not present

## 2019-12-24 MED ORDER — AMOXICILLIN-POT CLAVULANATE 875-125 MG PO TABS: 1.0000 | ORAL_TABLET | Freq: Two times a day (BID) | ORAL | 0 refills | Status: DC

## 2019-12-24 NOTE — Assessment & Plan Note (Signed)
Will treat with 10 day course of augmentin.  I think alternatives such as COVID are less likely at this time, but discussed that if she has worsening symptoms to let me know.  Increase fluid intake Supportive care with OTC medications prn.  Instructed to call for new or worsening symptoms. She expresses understanding.

## 2019-12-24 NOTE — Progress Notes (Signed)
Acetaminophen, Ibuprofen, as needed.   Flonase every day.   Sinus pressure.   Started about the end of last week.

## 2019-12-24 NOTE — Progress Notes (Signed)
Diana Solis - 64 y.o. female MRN [id number]  Date of birth: [birth year]   This visit type was conducted due to national recommendations for restrictions regarding the COVID-19 Pandemic (e.g. social distancing).  This format is felt to be most appropriate for this patient at this time.  All issues noted in this document were discussed and addressed.  No physical exam was performed (except for noted visual exam findings with Video Visits).  I discussed the limitations of evaluation and management by telemedicine and the availability of in person appointments. The patient expressed understanding and agreed to proceed.  I connected with@ on 12/24/19 at  2:20 PM EST by a video enabled telemedicine application and verified that I am speaking with the correct person using two identifiers.  Present at visit: Everrett Coombe, DO Vangie Bicker   Patient Location:  Home [address] [place] [address]   Provider location:   Primary Care [facility]  Chief Complaint  Patient presents with  . Sinusitis    HPI  Diana Solis is a 64 y.o. female who presents via audio/video conferencing for a telehealth visit today.  She has complaint of sinus pain and pressure with thick yellow discharge and scant blood mixed in.  She also has mild headache and mildly elevated temp.  Symptoms started about 1 week ago.  She has history of COPD but denies increased cough, wheezing or shortness of breath.  She has not had body aches or GI symptoms.   ROS:  A comprehensive ROS was completed and negative except as noted per HPI  Past Medical History:  Diagnosis Date  . COPD (chronic obstructive pulmonary disease) (HCC)   . DIVERTICULOSIS, COLON 12/10/2010   Qualifier: Diagnosis of  By: Orson Aloe MD, Tinnie Gens    . GERD (gastroesophageal reflux disease)   . GLAUCOMA 09/29/2009   Qualifier: Diagnosis of  By: Linford Arnold MD, Santina Evans    . 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 Hypothyroid   . Urinary  incontinence 10/19/2011    Past Surgical History:  Procedure Laterality Date  . BREAST CYST EXCISION    . INCONTINENCE SURGERY    . TONSILLECTOMY    . VAGINAL HYSTERECTOMY      Family History  Problem Relation Age of Onset  . Asthma Mother   . Thyroid disease Mother   . Lung cancer Father        smoker  . Lupus Father     Social History   Socioeconomic History  . Marital status: Married    Spouse name: Darrel  . Number of children: 1  . Years of education: Not on file  . Highest education level: Not on file  Occupational History  . Not on file  Tobacco Use  . Smoking status: Former Smoker    Packs/day: 0.50    Years: 40.00    Pack years: 20.00    Types: Cigarettes  . Smokeless tobacco: Current User  . Tobacco comment: e-cig  Substance and Sexual Activity  . Alcohol use: Yes    Alcohol/week: 6.0 standard drinks    Types: 6 Standard drinks or equivalent per week  . Drug use: No  . Sexual activity: Not on file  Other Topics Concern  . Not on file  Social History Narrative   No regular exercise. 3- 4 caffeine drinks per day. 3 grandkids.    Social Determinants of Health   Financial Resource Strain:   . Difficulty of Paying Living Expenses: Not on file  Food  Insecurity:   . Worried About Programme researcher, broadcasting/film/video in the Last Year: Not on file  . Ran Out of Food in the Last Year: Not on file  Transportation Needs:   . Lack of Transportation (Medical): Not on file  . Lack of Transportation (Non-Medical): Not on file  Physical Activity:   . Days of Exercise per Week: Not on file  . Minutes of Exercise per Session: Not on file  Stress:   . Feeling of Stress : Not on file  Social Connections:   . Frequency of Communication with Friends and Family: Not on file  . Frequency of Social Gatherings with Friends and Family: Not on file  . Attends Religious Services: Not on file  . Active Member of Clubs or Organizations: Not on file  . Attends Banker  Meetings: Not on file  . Marital Status: Not on file  Intimate Partner Violence:   . Fear of Current or Ex-Partner: Not on file  . Emotionally Abused: Not on file  . Physically Abused: Not on file  . Sexually Abused: Not on file     Current Outpatient Medications:  .  albuterol (PROAIR HFA) 108 (90 Base) MCG/ACT inhaler, Inhale 2 puffs into the lungs every 6 (six) hours as needed for wheezing or shortness of breath., Disp: 18 g, Rfl: 1 .  atorvastatin (LIPITOR) 40 MG tablet, Take 1 tablet (40 mg total) by mouth daily., Disp: 90 tablet, Rfl: 3 .  Esomeprazole Magnesium (NEXIUM PO), Take by mouth daily., Disp: , Rfl:  .  fluticasone furoate-vilanterol (BREO ELLIPTA) 200-25 MCG/INH AEPB, Inhale 1 puff into the lungs daily., Disp: 90 each, Rfl: 3 .  levothyroxine (SYNTHROID) 100 MCG tablet, TAKE 1 TABLET BY MOUTH DAILY, Disp: 90 tablet, Rfl: 1 .  LUMIGAN 0.01 % SOLN, Place 1 drop into both eyes at bedtime. , Disp: , Rfl: 0 .  Multiple Vitamin (MULTIVITAMIN) tablet, Take 1 tablet by mouth daily., Disp: , Rfl:  .  polyethylene glycol powder (GLYCOLAX/MIRALAX) 17 GM/SCOOP powder, Take 17 g by mouth daily., Disp: 850 g, Rfl: 12 .  Tiotropium Bromide Monohydrate (SPIRIVA RESPIMAT) 1.25 MCG/ACT AERS, Take 2 puffs by mouth daily., Disp: 4 g, Rfl: 12 .  valACYclovir (VALTREX) 1000 MG tablet, TAKE 1 TABLET BY MOUTH EVERY DAY, Disp: 90 tablet, Rfl: 1 .  vitamin C (ASCORBIC ACID) 500 MG tablet, Take 500 mg by mouth daily., Disp: , Rfl:  .  amoxicillin-clavulanate (AUGMENTIN) 875-125 MG tablet, Take 1 tablet by mouth 2 (two) times daily., Disp: 20 tablet, Rfl: 0  EXAM:  VITALS per patient if applicable: Temp 99.8 F (37.7 C) (Oral)   Ht 5\' 4"  (1.626 m)   Wt 177 lb (80.3 kg)   BMI 30.38 kg/m   GENERAL: alert, oriented, appears well and in no acute distress  HEENT: atraumatic, conjunttiva clear, no obvious abnormalities on inspection of external nose and ears  NECK: normal movements of the head  and neck  LUNGS: on inspection no signs of respiratory distress, breathing rate appears normal, no obvious gross SOB, gasping or wheezing  CV: no obvious cyanosis  MS: moves all visible extremities without noticeable abnormality  PSYCH/NEURO: pleasant and cooperative, no obvious depression or anxiety, speech and thought processing grossly intact  ASSESSMENT AND PLAN:  Discussed the following assessment and plan:  Acute maxillary sinusitis Will treat with 10 day course of augmentin.  I think alternatives such as COVID are less likely at this time, but discussed that if  she has worsening symptoms to let me know.  Increase fluid intake Supportive care with OTC medications prn.  Instructed to call for new or worsening symptoms. She expresses understanding.    35 minutes spent including pre visit preparation, review of prior notes and labs, encounter with patient via video visit and same day documentation.     I discussed the assessment and treatment plan with the patient. The patient was provided an opportunity to ask questions and all were answered. The patient agreed with the plan and demonstrated an understanding of the instructions.   The patient was advised to call back or seek an in-person evaluation if the symptoms worsen or if the condition fails to improve as anticipated.    Luetta Nutting, DO

## 2020-01-12 ENCOUNTER — Other Ambulatory Visit: Payer: Self-pay | Admitting: Family Medicine

## 2020-01-12 DIAGNOSIS — J439 Emphysema, unspecified: Secondary | ICD-10-CM

## 2020-01-30 ENCOUNTER — Other Ambulatory Visit: Payer: Self-pay | Admitting: Family Medicine

## 2020-01-30 DIAGNOSIS — Z1231 Encounter for screening mammogram for malignant neoplasm of breast: Secondary | ICD-10-CM

## 2020-03-02 ENCOUNTER — Other Ambulatory Visit: Payer: Self-pay | Admitting: Family Medicine

## 2020-03-08 ENCOUNTER — Encounter: Payer: Self-pay | Admitting: Family Medicine

## 2020-03-11 ENCOUNTER — Other Ambulatory Visit: Payer: Self-pay

## 2020-03-11 MED ORDER — ALBUTEROL SULFATE HFA 108 (90 BASE) MCG/ACT IN AERS: 2.0000 | INHALATION_SPRAY | Freq: Four times a day (QID) | RESPIRATORY_TRACT | 1 refills | Status: DC | PRN

## 2020-04-07 ENCOUNTER — Ambulatory Visit: Payer: BC Managed Care – PPO

## 2020-04-08 ENCOUNTER — Ambulatory Visit: Payer: BC Managed Care – PPO | Admitting: Family Medicine

## 2020-04-09 ENCOUNTER — Encounter: Payer: Self-pay | Admitting: Family Medicine

## 2020-04-09 ENCOUNTER — Ambulatory Visit (INDEPENDENT_AMBULATORY_CARE_PROVIDER_SITE_OTHER): Payer: BC Managed Care – PPO | Admitting: Family Medicine

## 2020-04-09 VITALS — BP 120/70 | HR 73

## 2020-04-09 DIAGNOSIS — E039 Hypothyroidism, unspecified: Secondary | ICD-10-CM | POA: Diagnosis not present

## 2020-04-09 DIAGNOSIS — M25512 Pain in left shoulder: Secondary | ICD-10-CM | POA: Insufficient documentation

## 2020-04-09 MED ORDER — MELOXICAM 7.5 MG PO TABS: 7.5000 mg | ORAL_TABLET | Freq: Every day | ORAL | 0 refills | Status: DC

## 2020-04-09 MED ORDER — CYCLOBENZAPRINE HCL 10 MG PO TABS: 10.0000 mg | ORAL_TABLET | Freq: Three times a day (TID) | ORAL | 0 refills | Status: DC | PRN

## 2020-04-09 NOTE — Progress Notes (Signed)
Diana Solis - 64 y.o. female MRN [id number]  Date of birth: 31-Mar-1956  Subjective Chief Complaint  Patient presents with  . Arm Pain    HPI Diana Solis is a 64 y.o. female with history of COPD, hypothyroidism,  And GERD here today with complaint of L neck and shoulder pain.  She reports that this started a coule of weeks ago. She does not recall any injury other that running into a barbell bar a few weeks ago but that didn't seem to bother her too much at that time.  Pain radiates from shoulder into arm and elbow.  Arm and hand feel weak at times.  She does have pain with movement at the shoulder joint as well.  There has been no numbness, tingling.   ROS:  A comprehensive ROS was completed and negative except as noted per HPI  No Known Allergies  Past Medical History:  Diagnosis Date  . COPD (chronic obstructive pulmonary disease) (HCC)   . DIVERTICULOSIS, COLON 12/10/2010   Qualifier: Diagnosis of  By: Orson Aloe MD, Tinnie Gens    . GERD (gastroesophageal reflux disease)   . GLAUCOMA 09/29/2009   Qualifier: Diagnosis of  By: Linford Arnold MD, Santina Evans    . 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 Hypothyroid   . Urinary incontinence 10/19/2011    Past Surgical History:  Procedure Laterality Date  . BREAST CYST EXCISION    . INCONTINENCE SURGERY    . TONSILLECTOMY    . VAGINAL HYSTERECTOMY      Social History   Socioeconomic History  . Marital status: Married    Spouse name: Darrel  . Number of children: 1  . Years of education: Not on file  . Highest education level: Not on file  Occupational History  . Not on file  Tobacco Use  . Smoking status: Former Smoker    Packs/day: 0.50    Years: 40.00    Pack years: 20.00    Types: Cigarettes  . Smokeless tobacco: Current User  . Tobacco comment: e-cig  Substance and Sexual Activity  . Alcohol use: Yes    Alcohol/week: 6.0 standard drinks    Types: 6 Standard drinks or equivalent per week  . Drug use: No  . Sexual  activity: Not on file  Other Topics Concern  . Not on file  Social History Narrative   No regular exercise. 3- 4 caffeine drinks per day. 3 grandkids.    Social Determinants of Health   Financial Resource Strain:   . Difficulty of Paying Living Expenses:   Food Insecurity:   . Worried About Programme researcher, broadcasting/film/video in the Last Year:   . Barista in the Last Year:   Transportation Needs:   . Freight forwarder (Medical):   Marland Kitchen Lack of Transportation (Non-Medical):   Physical Activity:   . Days of Exercise per Week:   . Minutes of Exercise per Session:   Stress:   . Feeling of Stress :   Social Connections:   . Frequency of Communication with Friends and Family:   . Frequency of Social Gatherings with Friends and Family:   . Attends Religious Services:   . Active Member of Clubs or Organizations:   . Attends Banker Meetings:   Marland Kitchen Marital Status:     Family History  Problem Relation Age of Onset  . Asthma Mother   . Thyroid disease Mother   . Lung cancer Father  smoker  . Lupus Father     Health Maintenance  Topic Date Due  . MAMMOGRAM  02/22/2019  . INFLUENZA VACCINE  06/13/2020  . TETANUS/TDAP  10/13/2020  . COLONOSCOPY  03/27/2027  . COVID-19 Vaccine  Completed  . Hepatitis C Screening  Completed  . HIV Screening  Completed     ----------------------------------------------------------------------------------------------------------------------------------------------------------------------------------------------------------------- Physical Exam BP 120/70   Pulse 73   Physical Exam Constitutional:      Appearance: Normal appearance.  Cardiovascular:     Rate and Rhythm: Normal rate and regular rhythm.  Pulmonary:     Effort: Pulmonary effort is normal.     Breath sounds: Normal breath sounds.  Musculoskeletal:     Cervical back: Normal range of motion and neck supple.     Comments: TTP along upper trapezius and cervical  paraspinals on L.  +spurlings on L. ROM of shoulder is normal but has pain with abduction and flexion.  This is improved with passive ROM testing.  Mild impingement signs but strength is normal throughout.  Good grip strength and negative tinel at carpal tunnel, [place] and cubital tunnel.   Neurological:     Mental Status: She is alert.     ------------------------------------------------------------------------------------------------------------------------------------------------------------------------------------------------------------------- Assessment and Plan  Hypothyroidism Will have her stop by lab to update TSH today   Shoulder pain, left Her symptoms sound more radicular based on history.  She does have some mild pain with ROM testing of the shoulder which may indicate some rotator cuff tendinitis but this does not really seem significant at this time.  Discussed trial of steroids however she declines due to side effects she has experienced with these in the past.  Will start meloxicam and flexeril and given HEP for neck.   F/u in 6 weeks, if not improving may need trial of formal PT.     Meds ordered this encounter  Medications  . cyclobenzaprine (FLEXERIL) 10 MG tablet    Sig: Take 1 tablet (10 mg total) by mouth 3 (three) times daily as needed for muscle spasms.    Dispense:  30 tablet    Refill:  0  . meloxicam (MOBIC) 7.5 MG tablet    Sig: Take 1-2 tablets (7.5-15 mg total) by mouth daily.    Dispense:  45 tablet    Refill:  0    Return in about 6 weeks (around 05/21/2020) for neck/shoulder pain.    This visit occurred during the SARS-CoV-2 public health emergency.  Safety protocols were in place, including screening questions prior to the visit, additional usage of staff PPE, and extensive cleaning of exam room while observing appropriate contact time as indicated for disinfecting solutions.

## 2020-04-09 NOTE — Assessment & Plan Note (Signed)
Her symptoms sound more radicular based on history.  She does have some mild pain with ROM testing of the shoulder which may indicate some rotator cuff tendinitis but this does not really seem significant at this time.  Discussed trial of steroids however she declines due to side effects she has experienced with these in the past.  Will start meloxicam and flexeril and given HEP for neck.   F/u in 6 weeks, if not improving may need trial of formal PT.

## 2020-04-09 NOTE — Patient Instructions (Addendum)
Start meloxicam 1-2 tablets daily.  Take with food Flexeril as needed for spasm.  Follow up with me in 6 weeks.    Stretching and range-of-motion exercises Cervical side bending  1. Using good posture, sit on a stable chair or stand up. 2. Without moving your shoulders, slowly tilt your left / right ear to your shoulder until you feel a stretch in the opposite side neck muscles. You should be looking straight ahead. 3. Hold for __________ seconds. 4. Repeat with the other side of your neck. Repeat __________ times. Complete this exercise __________ times a day. Cervical rotation  1. Using good posture, sit on a stable chair or stand up. 2. Slowly turn your head to the side as if you are looking over your left / right shoulder. ? Keep your eyes level with the ground. ? Stop when you feel a stretch along the side and the back of your neck. 3. Hold for __________ seconds. 4. Repeat this by turning to your other side. Repeat __________ times. Complete this exercise __________ times a day. Thoracic extension and pectoral stretch 1. Roll a towel or a small blanket so it is about 4 inches (10 cm) in diameter. 2. Lie down on your back on a firm surface. 3. Put the towel lengthwise, under your spine in the middle of your back. It should not be under your shoulder blades. The towel should line up with your spine from your middle back to your lower back. 4. Put your hands behind your head and let your elbows fall out to your sides. 5. Hold for __________ seconds. Repeat __________ times. Complete this exercise __________ times a day. Strengthening exercises Isometric upper cervical flexion 1. Lie on your back with a thin pillow behind your head and a small rolled-up towel under your neck. 2. Gently tuck your chin toward your chest and nod your head down to look toward your feet. Do not lift your head off the pillow. 3. Hold for __________ seconds. 4. Release the tension slowly. Relax your neck  muscles completely before you repeat this exercise. Repeat __________ times. Complete this exercise __________ times a day. Isometric cervical extension  1. Stand about 6 inches (15 cm) away from a wall, with your back facing the wall. 2. Place a soft object, about 6-8 inches (15-20 cm) in diameter, between the back of your head and the wall. A soft object could be a small pillow, a ball, or a folded towel. 3. Gently tilt your head back and press into the soft object. Keep your jaw and forehead relaxed. 4. Hold for __________ seconds. 5. Release the tension slowly. Relax your neck muscles completely before you repeat this exercise. Repeat __________ times. Complete this exercise __________ times a day. Posture and body mechanics Body mechanics refers to the movements and positions of your body while you do your daily activities. Posture is part of body mechanics. Good posture and healthy body mechanics can help to relieve stress in your body's tissues and joints. Good posture means that your spine is in its natural S-curve position (your spine is neutral), your shoulders are pulled back slightly, and your head is not tipped forward. The following are general guidelines for applying improved posture and body mechanics to your everyday activities. Sitting  1. When sitting, keep your spine neutral and keep your feet flat on the floor. Use a footrest, if necessary, and keep your thighs parallel to the floor. Avoid rounding your shoulders, and avoid tilting your head  forward. 2. When working at a desk or a computer, keep your desk at a height where your hands are slightly lower than your elbows. Slide your chair under your desk so you are close enough to maintain good posture. 3. When working at a computer, place your monitor at a height where you are looking straight ahead and you do not have to tilt your head forward or downward to look at the screen. Standing   When standing, keep your spine  neutral and keep your feet about hip-width apart. Keep a slight bend in your knees. Your ears, shoulders, and hips should line up.  When you do a task in which you stand in one place for a long time, place one foot up on a stable object that is 2-4 inches (5-10 cm) high, such as a footstool. This helps keep your spine neutral. Resting When lying down and resting, avoid positions that are most painful for you. Try to support your neck in a neutral position. You can use a contour pillow or a small rolled-up towel. Your pillow should support your neck but not push on it. This information is not intended to replace advice given to you by your health care provider. Make sure you discuss any questions you have with your health care provider. Document Revised: 02/19/2019 Document Reviewed: 07/31/2018 Elsevier Patient Education  2020 [employer].

## 2020-04-09 NOTE — Assessment & Plan Note (Signed)
Will have her stop by lab to update TSH today

## 2020-04-10 ENCOUNTER — Other Ambulatory Visit: Payer: Self-pay | Admitting: Physician Assistant

## 2020-04-10 DIAGNOSIS — B001 Herpesviral vesicular dermatitis: Secondary | ICD-10-CM

## 2020-04-10 LAB — TSH: TSH: 2.36 mIU/L (ref 0.40–4.50)

## 2020-04-15 ENCOUNTER — Other Ambulatory Visit: Payer: Self-pay

## 2020-04-15 ENCOUNTER — Ambulatory Visit (INDEPENDENT_AMBULATORY_CARE_PROVIDER_SITE_OTHER): Payer: BC Managed Care – PPO

## 2020-04-15 DIAGNOSIS — Z1231 Encounter for screening mammogram for malignant neoplasm of breast: Secondary | ICD-10-CM

## 2020-04-15 IMAGING — MG DIGITAL SCREENING BILAT W/ TOMO W/ CAD
8 series · 8 of 24 positions shown · non-contrast
Comparison: Previous exam(s).

CLINICAL DATA: Screening.

EXAM:
DIGITAL SCREENING BILATERAL MAMMOGRAM WITH TOMO AND CAD

[R MLO synth-2D]
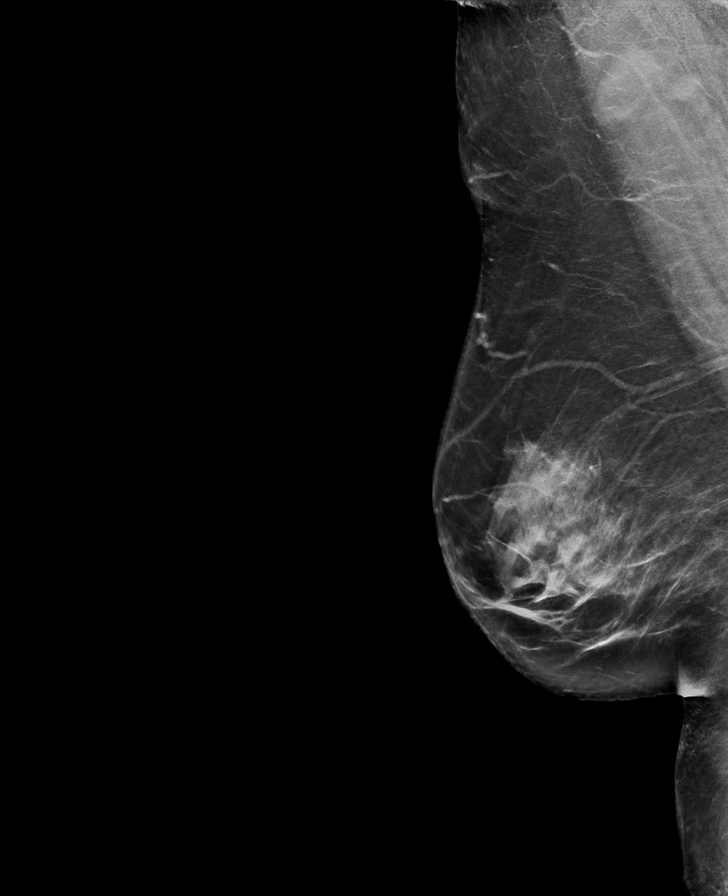

[L CC synth-2D]
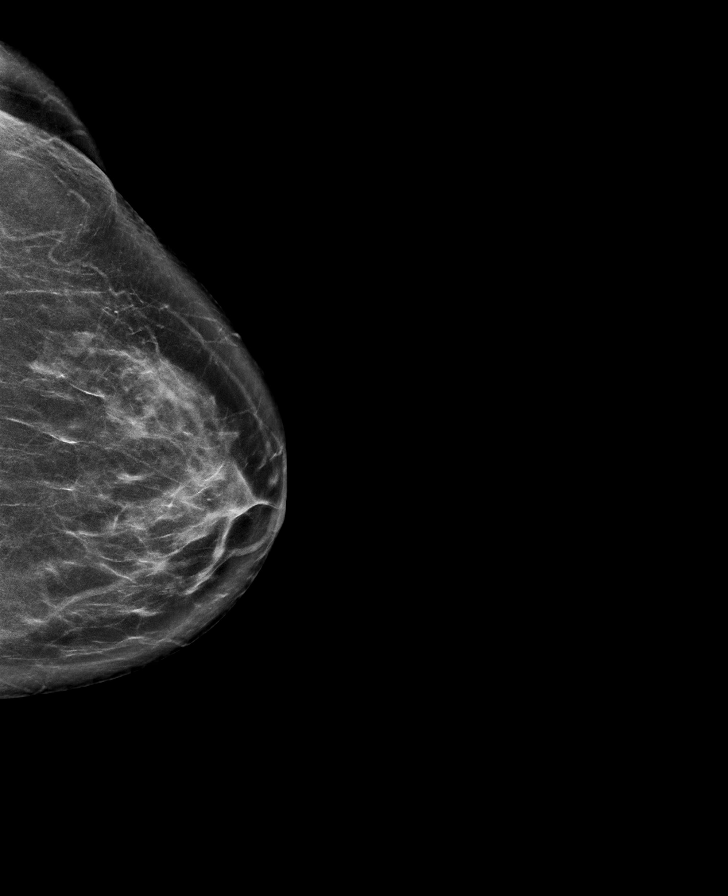

[R CC synth-2D]
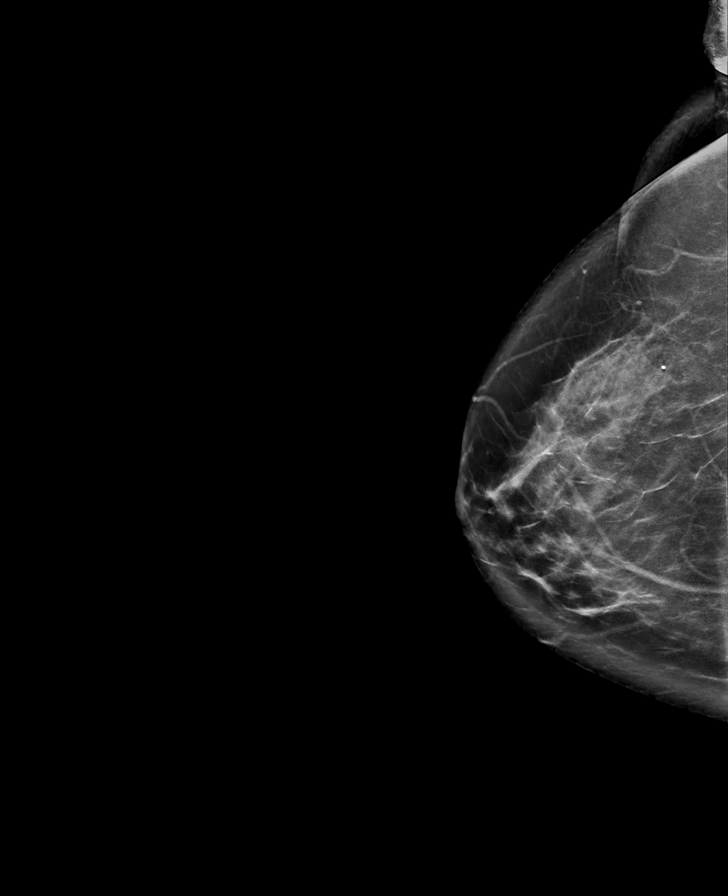

[L MLO synth-2D]
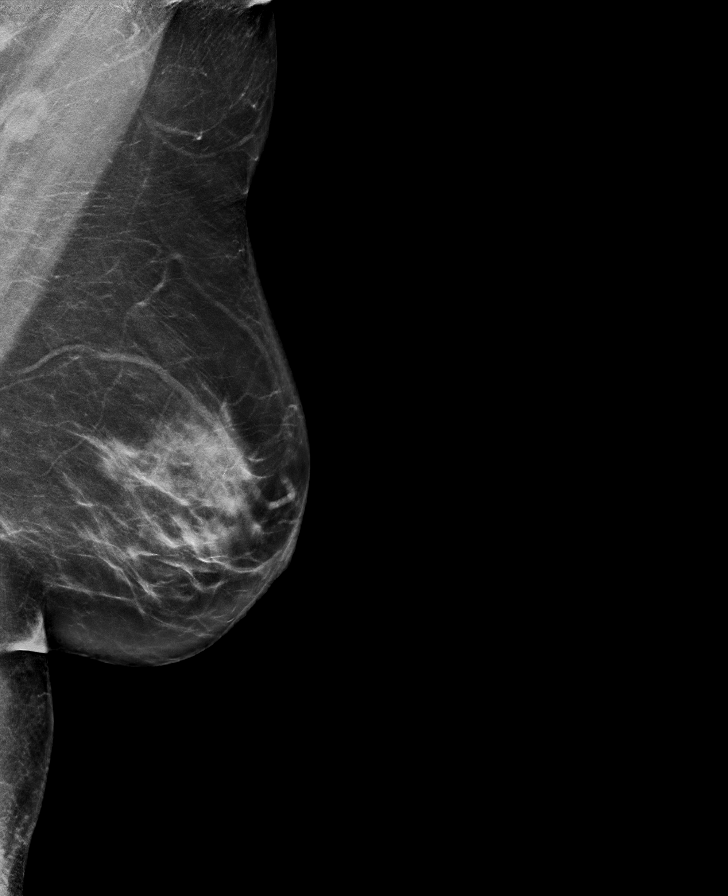

[R MLO tomo · tomo slice 41/81.0]
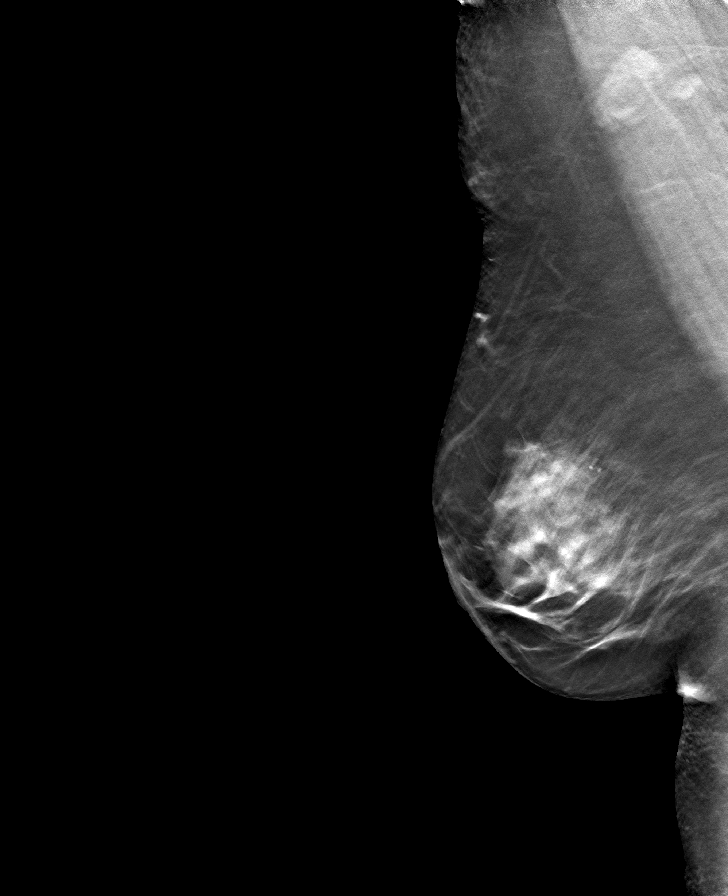

[R CC tomo · tomo slice 39/78.0]
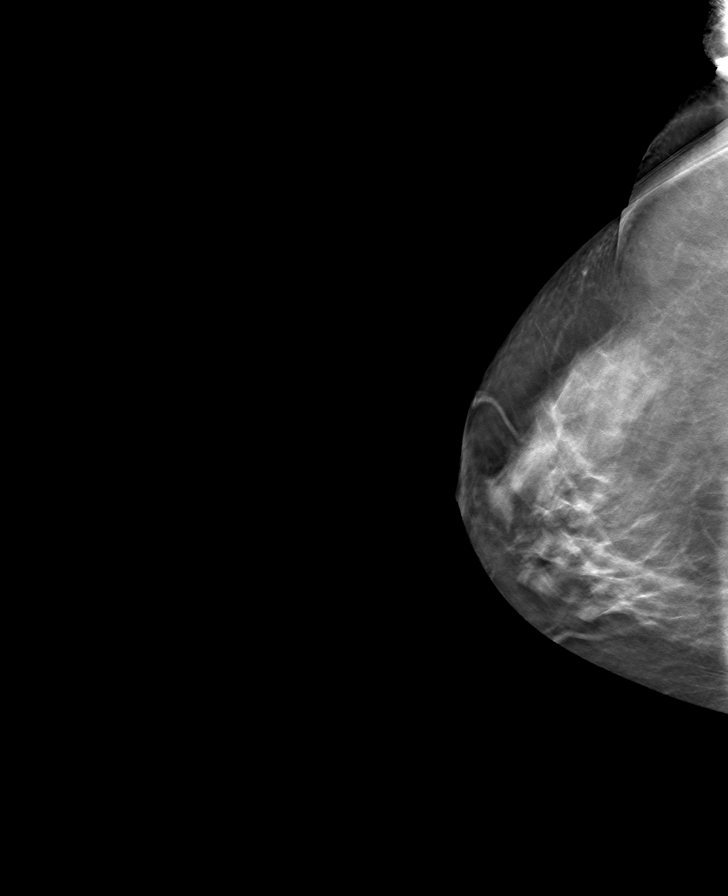

[L CC tomo · tomo slice 37/74.0]
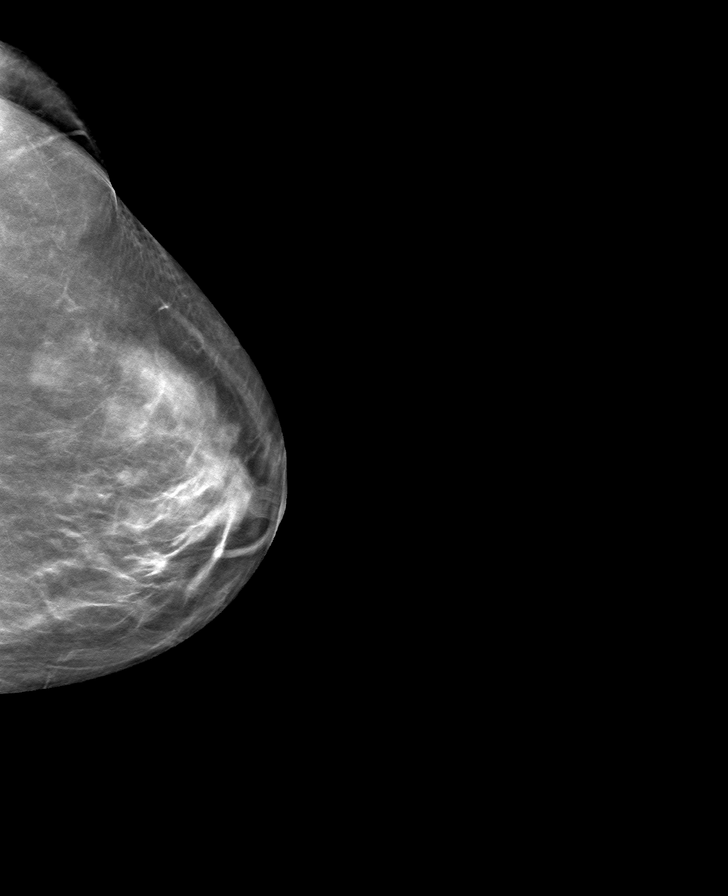

[L MLO tomo · tomo slice 40/79.0]
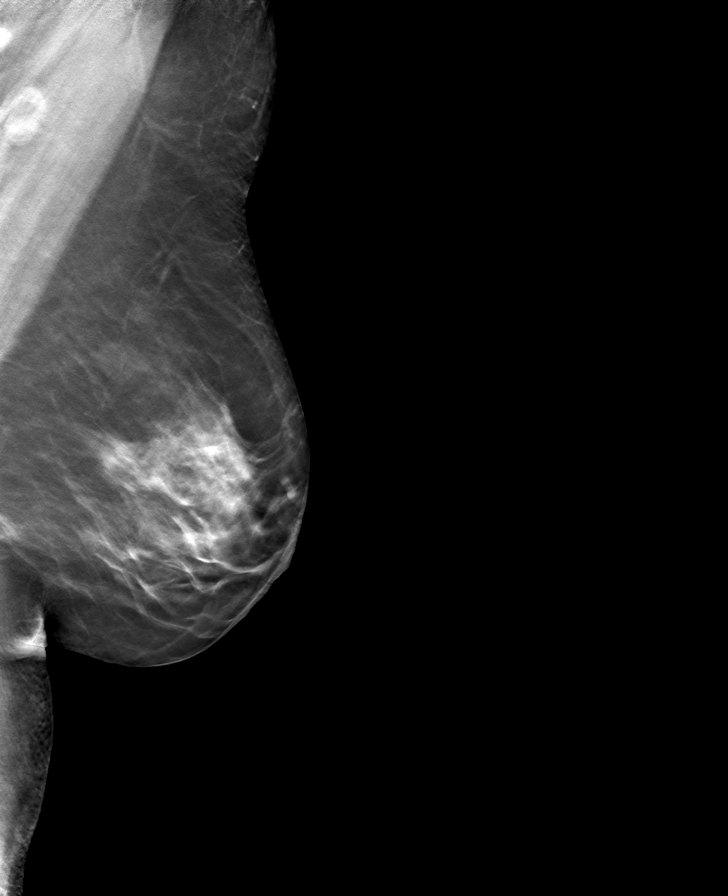

[8 of 24 positions shown; findings below may reference images not displayed]

ACR Breast Density Category c: The breast tissue is heterogeneously
dense, which may obscure small masses.
FINDINGS: There are no findings suspicious for malignancy. Images were
processed with CAD.
IMPRESSION: No mammographic evidence of malignancy. A result letter of this
screening mammogram will be mailed directly to the patient.

RECOMMENDATION:
Screening mammogram in one year. (Code:FT-U-LHB)

BI-RADS CATEGORY  1: Negative.

## 2020-05-21 ENCOUNTER — Other Ambulatory Visit: Payer: Self-pay

## 2020-05-21 ENCOUNTER — Ambulatory Visit: Payer: BC Managed Care – PPO | Admitting: Family Medicine

## 2020-05-21 MED ORDER — SPIRIVA RESPIMAT 1.25 MCG/ACT IN AERS: 2.0000 | INHALATION_SPRAY | Freq: Every day | RESPIRATORY_TRACT | 12 refills | Status: DC

## 2020-07-09 ENCOUNTER — Other Ambulatory Visit: Payer: Self-pay | Admitting: Family Medicine

## 2020-07-09 DIAGNOSIS — J439 Emphysema, unspecified: Secondary | ICD-10-CM

## 2020-07-12 ENCOUNTER — Telehealth: Payer: Self-pay | Admitting: Family Medicine

## 2020-07-12 NOTE — Telephone Encounter (Signed)
Received PA for Spiriva sent through cover my meds waiting on determination. - CF

## 2020-07-13 NOTE — Telephone Encounter (Signed)
Received fax from [facility] stating the plan does not require a PA and that their is a paid claim from 06/17/20 and next refill will be on 07/14/20. - CF

## 2020-09-02 ENCOUNTER — Other Ambulatory Visit: Payer: Self-pay | Admitting: Family Medicine

## 2020-10-09 ENCOUNTER — Other Ambulatory Visit: Payer: Self-pay | Admitting: Family Medicine

## 2020-10-11 NOTE — Telephone Encounter (Signed)
Filled for 30 days.  Needs appt/updated labs

## 2020-10-12 ENCOUNTER — Telehealth (INDEPENDENT_AMBULATORY_CARE_PROVIDER_SITE_OTHER): Payer: BC Managed Care – PPO | Admitting: Family Medicine

## 2020-10-12 ENCOUNTER — Encounter: Payer: Self-pay | Admitting: Family Medicine

## 2020-10-12 VITALS — BP 117/63 | HR 63 | Temp 97.7°F | Wt 172.0 lb

## 2020-10-12 DIAGNOSIS — E039 Hypothyroidism, unspecified: Secondary | ICD-10-CM | POA: Diagnosis not present

## 2020-10-12 DIAGNOSIS — B001 Herpesviral vesicular dermatitis: Secondary | ICD-10-CM

## 2020-10-12 DIAGNOSIS — E782 Mixed hyperlipidemia: Secondary | ICD-10-CM | POA: Diagnosis not present

## 2020-10-12 DIAGNOSIS — B009 Herpesviral infection, unspecified: Secondary | ICD-10-CM

## 2020-10-12 DIAGNOSIS — R7303 Prediabetes: Secondary | ICD-10-CM

## 2020-10-12 DIAGNOSIS — J439 Emphysema, unspecified: Secondary | ICD-10-CM

## 2020-10-12 MED ORDER — VALACYCLOVIR HCL 1 G PO TABS: 1000.0000 mg | ORAL_TABLET | Freq: Every day | ORAL | 1 refills | Status: DC

## 2020-10-12 MED ORDER — ALBUTEROL SULFATE HFA 108 (90 BASE) MCG/ACT IN AERS: 2.0000 | INHALATION_SPRAY | Freq: Four times a day (QID) | RESPIRATORY_TRACT | 1 refills | Status: DC | PRN

## 2020-10-12 NOTE — Assessment & Plan Note (Signed)
Valtrex working well for HSV suppression, continue at current strength.

## 2020-10-12 NOTE — Assessment & Plan Note (Signed)
COPD remains well controlled with spiriva and breo with albuterol as needed.  Albuterol renewed, continue daily inhalers.

## 2020-10-12 NOTE — Progress Notes (Signed)
Diana Solis - 64 y.o. female MRN [id number]  Date of birth: Oct 29, 1956   This visit type was conducted due to national recommendations for restrictions regarding the COVID-19 Pandemic (e.g. social distancing).  This format is felt to be most appropriate for this patient at this time.  All issues noted in this document were discussed and addressed.  No physical exam was performed (except for noted visual exam findings with Video Visits).  I discussed the limitations of evaluation and management by telemedicine and the availability of in person appointments. The patient expressed understanding and agreed to proceed.  I connected with@ on 10/12/20 at 11:10 AM EST by a video enabled telemedicine application and verified that I am speaking with the correct person using two identifiers.  Interactive audio and video telecommunications were attempted between this provider and patient, however failed, due to patient having technical difficulties OR patient did not have access to video capability.  We continued and completed visit with audio only.    Present at visit: Everrett Coombe, DO Vangie Bicker   Patient Location: Home [address] [place] [address]   Provider location:   [facility]  Chief Complaint  Patient presents with  . Medication Refill    HPI  Diana Solis is a 64 y.o. female who presents via audio/video conferencing for a telehealth visit today.  She is following up for hypothyroidism, HLD, COPD and HSV.    She reports that she is doing well at this time.  She is taking Breo and spiriva daily. She uses albuterol occasionally for cough or wheezing associated with COPD.  She is no longer smoking but does vape occasionally.   She remains compliant with current dose of levothyroxine and feels good at current dose.    She needs refills of valtrex, this is working well for HSV suppression.  No recent outbreaks.   She is tolerating atorvastatin well.  No myalgias with this.    ROS:  A comprehensive ROS was completed and negative except as noted per HPI  Past Medical History:  Diagnosis Date  . COPD (chronic obstructive pulmonary disease) (HCC)   . DIVERTICULOSIS, COLON 12/10/2010   Qualifier: Diagnosis of  By: Orson Aloe MD, Tinnie Gens    . GERD (gastroesophageal reflux disease)   . GLAUCOMA 09/29/2009   Qualifier: Diagnosis of  By: Linford Arnold MD, Santina Evans    . 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 Hypothyroid   . Urinary incontinence 10/19/2011    Past Surgical History:  Procedure Laterality Date  . BREAST CYST EXCISION    . BREAST EXCISIONAL BIOPSY    . INCONTINENCE SURGERY    . TONSILLECTOMY    . VAGINAL HYSTERECTOMY      Family History  Problem Relation Age of Onset  . Asthma Mother   . Thyroid disease Mother   . Lung cancer Father        smoker  . Lupus Father     Social History   Socioeconomic History  . Marital status: Married    Spouse name: Darrel  . Number of children: 1  . Years of education: Not on file  . Highest education level: Not on file  Occupational History  . Not on file  Tobacco Use  . Smoking status: Former Smoker    Packs/day: 0.50    Years: 40.00    Pack years: 20.00    Types: Cigarettes  . Smokeless tobacco: Current User  . Tobacco comment: e-cig  Vaping Use  . Vaping  Use: Never used  Substance and Sexual Activity  . Alcohol use: Yes    Alcohol/week: 6.0 standard drinks    Types: 6 Standard drinks or equivalent per week  . Drug use: No  . Sexual activity: Not on file  Other Topics Concern  . Not on file  Social History Narrative   No regular exercise. 3- 4 caffeine drinks per day. 3 grandkids.    Social Determinants of Health   Financial Resource Strain:   . Difficulty of Paying Living Expenses: Not on file  Food Insecurity:   . Worried About Programme researcher, broadcasting/film/video in the Last Year: Not on file  . Ran Out of Food in the Last Year: Not on file  Transportation Needs:   . Lack of Transportation  (Medical): Not on file  . Lack of Transportation (Non-Medical): Not on file  Physical Activity:   . Days of Exercise per Week: Not on file  . Minutes of Exercise per Session: Not on file  Stress:   . Feeling of Stress : Not on file  Social Connections:   . Frequency of Communication with Friends and Family: Not on file  . Frequency of Social Gatherings with Friends and Family: Not on file  . Attends Religious Services: Not on file  . Active Member of Clubs or Organizations: Not on file  . Attends Banker Meetings: Not on file  . Marital Status: Not on file  Intimate Partner Violence:   . Fear of Current or Ex-Partner: Not on file  . Emotionally Abused: Not on file  . Physically Abused: Not on file  . Sexually Abused: Not on file     Current Outpatient Medications:  .  albuterol (VENTOLIN HFA) 108 (90 Base) MCG/ACT inhaler, Inhale 2 puffs into the lungs every 6 (six) hours as needed for wheezing or shortness of breath., Disp: 18 g, Rfl: 1 .  atorvastatin (LIPITOR) 40 MG tablet, TAKE 1 TABLET(40 MG) BY MOUTH DAILY, Disp: 30 tablet, Rfl: 0 .  bimatoprost (LUMIGAN) 0.03 % ophthalmic solution, 1 drop at bedtime., Disp: , Rfl:  .  BREO ELLIPTA 200-25 MCG/INH AEPB, INHALE 1 PUFF INTO THE LUNGS DAILY, Disp: 180 each, Rfl: 1 .  Esomeprazole Magnesium (NEXIUM PO), Take by mouth daily., Disp: , Rfl:  .  levothyroxine (SYNTHROID) 100 MCG tablet, TAKE 1 TABLET BY MOUTH DAILY, Disp: 90 tablet, Rfl: 1 .  LUMIGAN 0.01 % SOLN, Place 1 drop into both eyes at bedtime. , Disp: , Rfl: 0 .  Multiple Vitamin (MULTIVITAMIN) tablet, Take 1 tablet by mouth daily., Disp: , Rfl:  .  polyethylene glycol powder (GLYCOLAX/MIRALAX) 17 GM/SCOOP powder, Take 17 g by mouth daily., Disp: 850 g, Rfl: 12 .  Tiotropium Bromide Monohydrate (SPIRIVA RESPIMAT) 1.25 MCG/ACT AERS, Take 2 puffs by mouth daily., Disp: 4 g, Rfl: 12 .  valACYclovir (VALTREX) 1000 MG tablet, Take 1 tablet (1,000 mg total) by mouth  daily., Disp: 90 tablet, Rfl: 1 .  vitamin C (ASCORBIC ACID) 500 MG tablet, Take 500 mg by mouth daily., Disp: , Rfl:   EXAM:  VITALS per patient if applicable: BP 117/63   Pulse 63   Temp 97.7 F (36.5 C)   Wt 172 lb (78 kg)   BMI 29.52 kg/m   GENERAL: alert, oriented,  and in no acute distress  PSYCH/NEURO: pleasant and cooperative, no obvious depression or anxiety, speech and thought processing grossly intact  ASSESSMENT AND PLAN:  Discussed the following assessment and plan:  Chronic obstructive pulmonary disease (HCC) COPD remains well controlled with spiriva and breo with albuterol as needed.  Albuterol renewed, continue daily inhalers.    Hypothyroidism Doing well at current dose.  Update TSH  HLD (hyperlipidemia) She is tolerating atorvastatin well.  Updated lipid panel ordered.   HSV infection Valtrex working well for HSV suppression, continue at current strength.   30 minutes spent including pre visit preparation, review of prior notes and labs, encounter with patient via telephone visit and same day documentation.    I discussed the assessment and treatment plan with the patient. The patient was provided an opportunity to ask questions and all were answered. The patient agreed with the plan and demonstrated an understanding of the instructions.   The patient was advised to call back or seek an in-person evaluation if the symptoms worsen or if the condition fails to improve as anticipated.    Everrett Coombe, DO

## 2020-10-12 NOTE — Assessment & Plan Note (Signed)
She is tolerating atorvastatin well.  Updated lipid panel ordered.

## 2020-10-12 NOTE — Assessment & Plan Note (Signed)
Doing well at current dose.  Update TSH

## 2020-10-21 LAB — COMPLETE METABOLIC PANEL WITH GFR
AG Ratio: 1.8 (calc) (ref 1.0–2.5)
ALT: 19 U/L (ref 6–29)
AST: 18 U/L (ref 10–35)
Albumin: 4.6 g/dL (ref 3.6–5.1)
Alkaline phosphatase (APISO): 103 U/L (ref 37–153)
BUN: 10 mg/dL (ref 7–25)
CO2: 30 mmol/L (ref 20–32)
Calcium: 9.8 mg/dL (ref 8.6–10.4)
Chloride: 102 mmol/L (ref 98–110)
Creat: 0.68 mg/dL (ref 0.50–0.99)
GFR, Est African American: 107 mL/min/{1.73_m2} (ref >=60)
GFR, Est Non African American: 92 mL/min/{1.73_m2} (ref >=60)
Globulin: 2.6 g/dL (calc) (ref 1.9–3.7)
Glucose, Bld: 116 mg/dL — ABNORMAL HIGH (ref 65–99)
Potassium: 4.7 mmol/L (ref 3.5–5.3)
Sodium: 138 mmol/L (ref 135–146)
Total Bilirubin: 0.4 mg/dL (ref 0.2–1.2)
Total Protein: 7.2 g/dL (ref 6.1–8.1)

## 2020-10-21 LAB — CBC
HCT: 40.9 % (ref 35.0–45.0)
Hemoglobin: 14.3 g/dL (ref 11.7–15.5)
MCH: 32.6 pg (ref 27.0–33.0)
MCHC: 35 g/dL (ref 32.0–36.0)
MCV: 93.4 fL (ref 80.0–100.0)
MPV: 9.8 fL (ref 7.5–12.5)
Platelets: 265 10*3/uL (ref 140–400)
RBC: 4.38 10*6/uL (ref 3.80–5.10)
RDW: 12.2 % (ref 11.0–15.0)
WBC: 6.1 10*3/uL (ref 3.8–10.8)

## 2020-10-21 LAB — HEMOGLOBIN A1C
Hgb A1c MFr Bld: 6 % of total Hgb — ABNORMAL HIGH (ref <5.7)
Mean Plasma Glucose: 126 mg/dL
eAG (mmol/L): 7 mmol/L

## 2020-10-21 LAB — LIPID PANEL
Cholesterol: 160 mg/dL (ref <200)
HDL: 70 mg/dL (ref >=50)
LDL Cholesterol (Calc): 71 mg/dL (calc)
Non-HDL Cholesterol (Calc): 90 mg/dL (calc) (ref <130)
Total CHOL/HDL Ratio: 2.3 (calc) (ref <5.0)
Triglycerides: 105 mg/dL (ref <150)

## 2020-10-21 LAB — TSH: TSH: 3 mIU/L (ref 0.40–4.50)

## 2020-11-07 ENCOUNTER — Other Ambulatory Visit: Payer: Self-pay | Admitting: Family Medicine

## 2020-12-26 ENCOUNTER — Other Ambulatory Visit: Payer: Self-pay | Admitting: Family Medicine

## 2021-01-06 ENCOUNTER — Other Ambulatory Visit: Payer: Self-pay | Admitting: Family Medicine

## 2021-01-06 DIAGNOSIS — J439 Emphysema, unspecified: Secondary | ICD-10-CM

## 2021-02-11 ENCOUNTER — Other Ambulatory Visit: Payer: Self-pay | Admitting: Family Medicine

## 2021-02-15 ENCOUNTER — Other Ambulatory Visit: Payer: Self-pay

## 2021-02-15 MED ORDER — ALBUTEROL SULFATE HFA 108 (90 BASE) MCG/ACT IN AERS: 2.0000 | INHALATION_SPRAY | Freq: Four times a day (QID) | RESPIRATORY_TRACT | 1 refills | Status: DC | PRN

## 2021-02-20 ENCOUNTER — Other Ambulatory Visit: Payer: Self-pay | Admitting: Family Medicine

## 2021-02-22 ENCOUNTER — Other Ambulatory Visit: Payer: Self-pay

## 2021-02-22 MED ORDER — ATORVASTATIN CALCIUM 40 MG PO TABS: ORAL_TABLET | ORAL | 3 refills | Status: DC

## 2021-02-22 MED ORDER — ALBUTEROL SULFATE HFA 108 (90 BASE) MCG/ACT IN AERS: 2.0000 | INHALATION_SPRAY | Freq: Four times a day (QID) | RESPIRATORY_TRACT | 1 refills | Status: DC | PRN

## 2021-03-14 ENCOUNTER — Telehealth (INDEPENDENT_AMBULATORY_CARE_PROVIDER_SITE_OTHER): Payer: Medicare Other | Admitting: Family Medicine

## 2021-03-14 ENCOUNTER — Encounter: Payer: Self-pay | Admitting: Family Medicine

## 2021-03-14 DIAGNOSIS — J441 Chronic obstructive pulmonary disease with (acute) exacerbation: Secondary | ICD-10-CM | POA: Diagnosis not present

## 2021-03-14 DIAGNOSIS — J069 Acute upper respiratory infection, unspecified: Secondary | ICD-10-CM | POA: Diagnosis not present

## 2021-03-14 NOTE — Patient Instructions (Signed)
I'm sorry you aren't feeling well. It is still too early to start antibiotics because most viral infections will begin improving within the first 10 days. We don't want to start antibiotics too early, because that increases antibiotic resistance rates, which causes antibiotics to be less effective. If you do not noticed any improvement over the next several days, or if your symptoms become more severe (high fevers, severe pain, worsening breathing, coughing up blood, confusion, etc.) before then, please send me a message and I will give you an antibiotic or we will get you seen in person so we can listen to your lungs and do a thorough assessment. In the meantime, here is a list of some over-the-counter medicines that might be helpful for you to try.  Over the counter medications that may be helpful for symptoms:  . Guaifenesin 1200 mg extended release tabs twice daily, with plenty of water o For cough and congestion o Brand name: Mucinex   . Pseudoephedrine 30 mg, one or two tabs every 4 to 6 hours o For sinus congestion o Brand name: Sudafed o You must get this from the pharmacy counter.  . Oxymetazoline nasal spray each morning, one spray in each nostril, for NO MORE THAN 3 days  o For nasal and sinus congestion o Brand name: Afrin . Saline nasal spray or Saline Nasal Irrigation 3-5 times a day o For nasal and sinus congestion o Brand names: Ocean or AYR . Fluticasone nasal spray, one spray in each nostril, each morning (after oxymetazoline and saline, if used) o For nasal and sinus congestion o Brand name: Flonase . Warm salt water gargles  o For sore throat o Every few hours as needed . Alternate ibuprofen 400-600 mg and acetaminophen 1000 mg every 4-6 hours o For fever, body aches, headache o Brand names: Motrin or Advil and Tylenol . Dextromethorphan 12-hour cough version 30 mg every 12 hours  o For cough o Brand name: Delsym Stop all other cold medications for now (Nyquil,  Dayquil, Tylenol Cold, Theraflu, etc) and other non-prescription cough/cold preparations. Many of these have the same ingredients listed above and could cause an overdose of medication.   Herbal treatments that have been shown to be helpful in some patients include: Vitamin C 1000mg  per day Vitamin D 4000iU per day Zinc 100mg  per day Quercetin 25-500mg  twice a day Melatonin 5-10mg  at bedtime  General Instructions . Allow your body to rest . Drink PLENTY of fluids  If you develop severe shortness of breath, uncontrolled fevers, coughing up blood, confusion, chest pain, or signs of dehydration (such as significantly decreased urine amounts or dizziness with standing) please go to the ER.

## 2021-03-14 NOTE — Progress Notes (Signed)
Virtual Visit via Telephone Note  I connected with  Diana Solis on 03/14/21 at 11:10 AM EDT by telephone and verified that I am speaking with the correct person using two identifiers.   I discussed the limitations, risks, security and privacy concerns of performing an evaluation and management service by telephone and the availability of in person appointments. I also discussed with the patient that there may be a patient responsible charge related to this service. The patient expressed understanding and agreed to proceed.  Participating parties included in this telephone visit include: The patient and the nurse practitioner listed.  The patient is: out of town I am: In the office  Subjective:    CC: sore throat, sinus pain  HPI: Diana Solis is a 65 y.o. year old female presenting today via telephone visit to discuss URI symptoms.  Patient with 4 days of sore throat, nasal congestion, postnasal drip, occasional cough to clear her throat with clear phlegm, bilateral ear fullness 1/10, and pansinusitis pressure 4/10. Reports she had a temp of 100 F yesterday, but no fevers today. Tylenol has been helping her symptoms somewhat. She is tired, but is not having any body aches, headaches, nausea, vomiting, diarrhea, chest pain, wheezing. States she does have COPD and has had to use her albuterol inhaler a total of about 3-4x over the past 4 days. She has been taking, Flonase, Zyrtec, Tylenol, in addition to her regular medications and COPD inhalers.   No known sick contacts. Currently out of town. Had 2 negative home COVID tests over the past 2 days.  Past medical history, Surgical history, Family history not pertinant except as noted below, Social history, Allergies, and medications have been entered into the medical record, reviewed, and corrections made.   Review of Systems:  All review of systems negative except what is listed in the HPI  Objective:    General:  Patient speaking  clearly in complete sentences. No shortness of breath noted.   Alert and oriented x3.   Normal judgment.  No apparent acute distress.  Impression and Recommendations:    1. Viral upper respiratory tract infection Given it has been only 4 days of mild URI symptoms and two negative home COVID tests, I would like to start conservatively. Patient was educated on viral vs bacterial infections and the need to wait closer to 10 days of symptoms before starting antibiotics unless symptoms become more severe. I encouraged her to continue Flonase, Zyrtec, Tylenol/Ibuprofen and recommend she add daily Mucinex, hydration, humidifier use, warm compresses, warm liquids, salt water gargles, OTC cough/cold medicines, and rest. I offered to give her a short burst of prednisone, but she declined, stating that she just wanted an antibiotic. Re-educated and offered a compromise, asking her to try a few more days of conservative measures; then, if she is not noticing any improvement over the next 3-4 days, or if symptoms become significantly worse before then, she can send me a message and I will send in an antibiotic. Given her negative COVID tests, she can be evaluated in office if symptoms worsen. Patient is aware of signs and symptoms requiring urgent evaluation.   Follow-up if symptoms worsen or fail to improve.  I discussed the assessment and treatment plan with the patient. The patient was provided an opportunity to ask questions and all were answered. The patient agreed with the plan and demonstrated an understanding of the instructions.   The patient was advised to call back or seek an in-person evaluation if  the symptoms worsen or if the condition fails to improve as anticipated.  I provided 20 minutes of non-face-to-face time during this TELEPHONE encounter.    Clayborne Dana, NP

## 2021-03-18 ENCOUNTER — Encounter: Payer: Self-pay | Admitting: Family Medicine

## 2021-03-18 DIAGNOSIS — J0100 Acute maxillary sinusitis, unspecified: Secondary | ICD-10-CM

## 2021-03-18 MED ORDER — AMOXICILLIN-POT CLAVULANATE 875-125 MG PO TABS: 1.0000 | ORAL_TABLET | Freq: Two times a day (BID) | ORAL | 0 refills | Status: AC

## 2021-03-23 ENCOUNTER — Encounter: Payer: Self-pay | Admitting: Family Medicine

## 2021-03-24 ENCOUNTER — Ambulatory Visit (INDEPENDENT_AMBULATORY_CARE_PROVIDER_SITE_OTHER): Payer: Medicare Other | Admitting: Family Medicine

## 2021-03-24 ENCOUNTER — Other Ambulatory Visit: Payer: Self-pay

## 2021-03-24 ENCOUNTER — Encounter: Payer: Self-pay | Admitting: Family Medicine

## 2021-03-24 ENCOUNTER — Ambulatory Visit (INDEPENDENT_AMBULATORY_CARE_PROVIDER_SITE_OTHER): Payer: Medicare Other

## 2021-03-24 VITALS — BP 131/71 | HR 83 | Temp 98.8°F | Resp 18

## 2021-03-24 DIAGNOSIS — J209 Acute bronchitis, unspecified: Secondary | ICD-10-CM | POA: Diagnosis not present

## 2021-03-24 DIAGNOSIS — J440 Chronic obstructive pulmonary disease with acute lower respiratory infection: Secondary | ICD-10-CM | POA: Diagnosis not present

## 2021-03-24 DIAGNOSIS — B009 Herpesviral infection, unspecified: Secondary | ICD-10-CM

## 2021-03-24 DIAGNOSIS — J329 Chronic sinusitis, unspecified: Secondary | ICD-10-CM

## 2021-03-24 DIAGNOSIS — E039 Hypothyroidism, unspecified: Secondary | ICD-10-CM | POA: Diagnosis not present

## 2021-03-24 DIAGNOSIS — R059 Cough, unspecified: Secondary | ICD-10-CM | POA: Diagnosis not present

## 2021-03-24 DIAGNOSIS — E782 Mixed hyperlipidemia: Secondary | ICD-10-CM

## 2021-03-24 DIAGNOSIS — J439 Emphysema, unspecified: Secondary | ICD-10-CM | POA: Diagnosis not present

## 2021-03-24 DIAGNOSIS — R21 Rash and other nonspecific skin eruption: Secondary | ICD-10-CM | POA: Diagnosis not present

## 2021-03-24 DIAGNOSIS — J40 Bronchitis, not specified as acute or chronic: Secondary | ICD-10-CM

## 2021-03-24 IMAGING — DX DG CHEST 2V
2 series · 2 of 2 positions shown · non-contrast
Comparison: Chest radiograph dated 03/18/2018

CLINICAL DATA: 65-year-old female with cough.

EXAM:
CHEST - 2 VIEW

[chest pa]
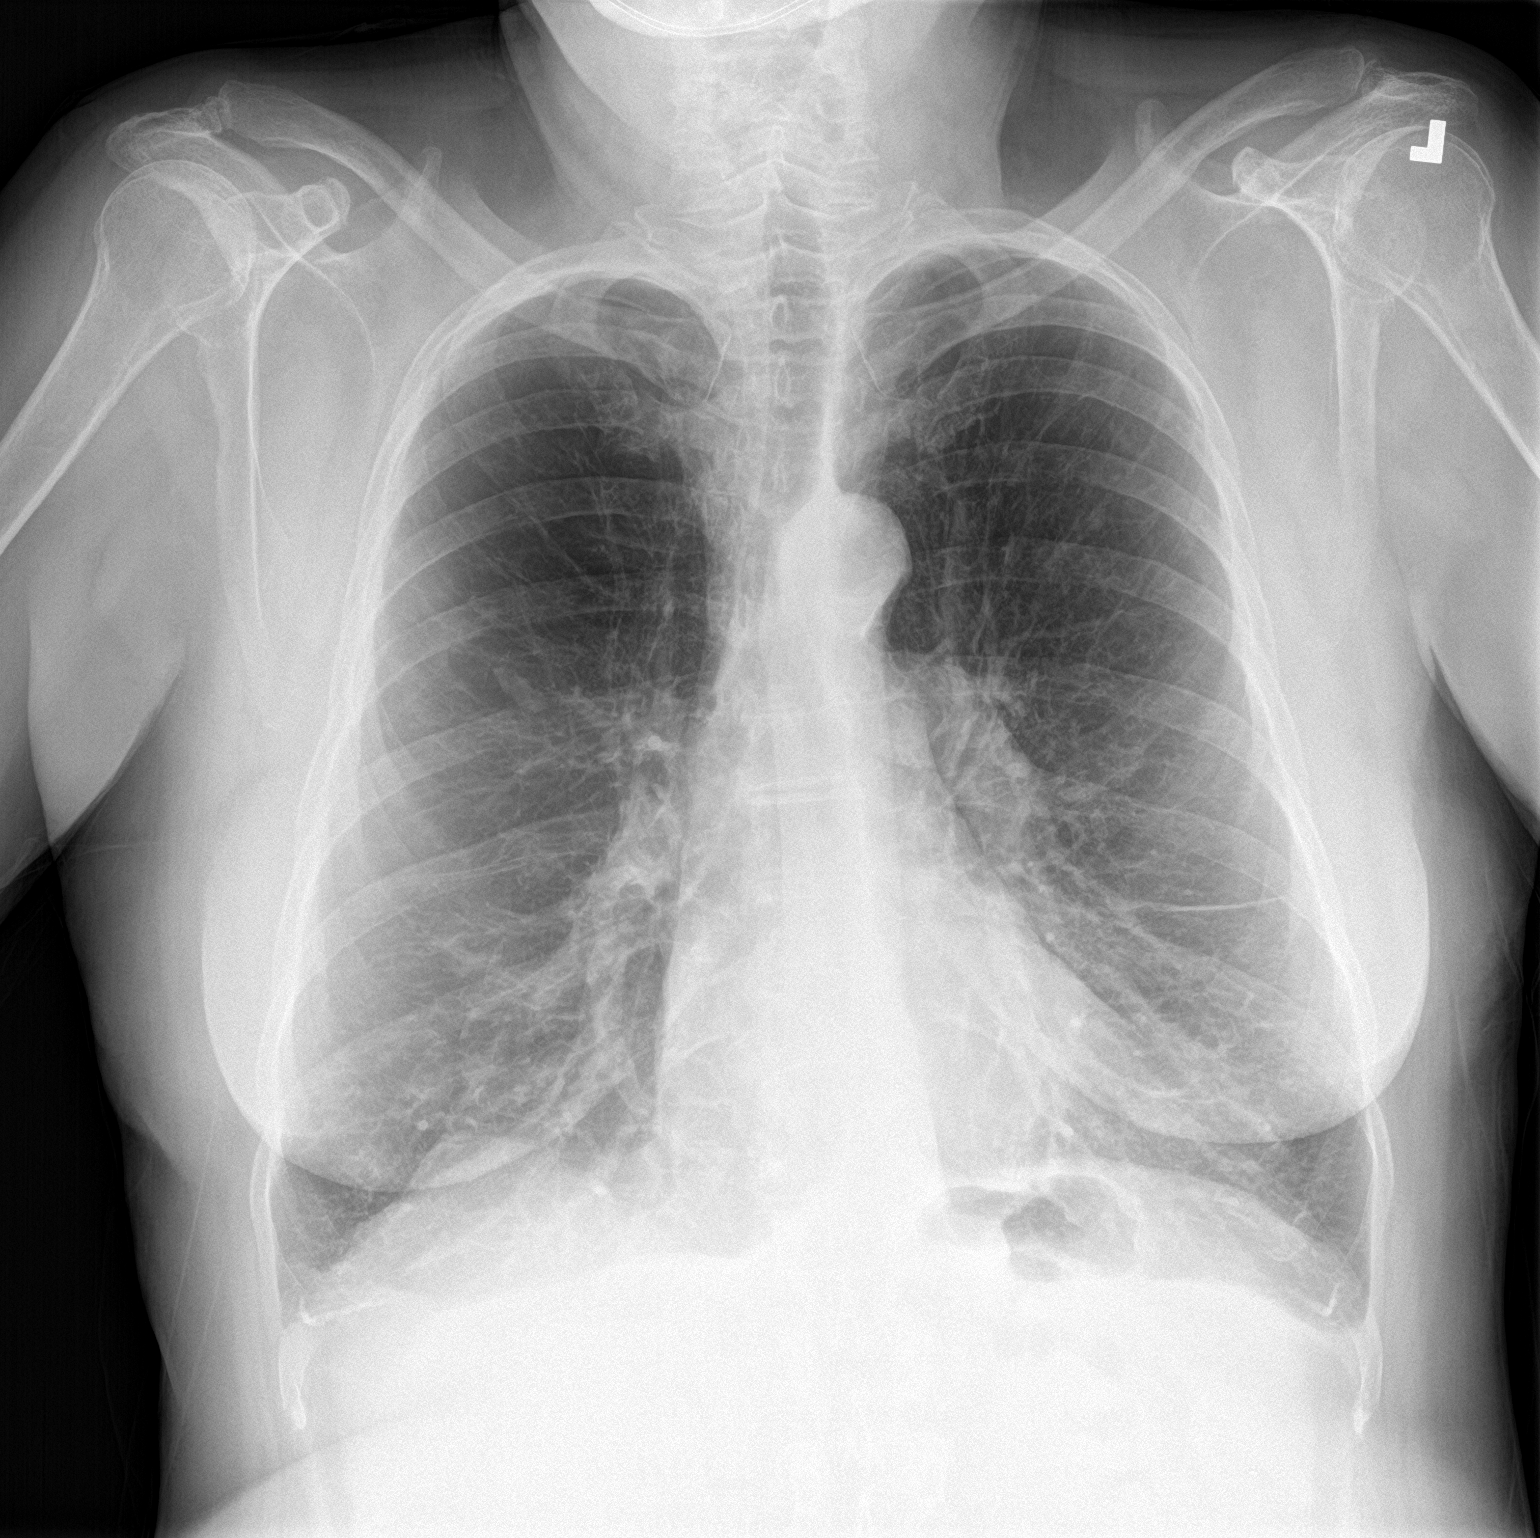

[chest lat]
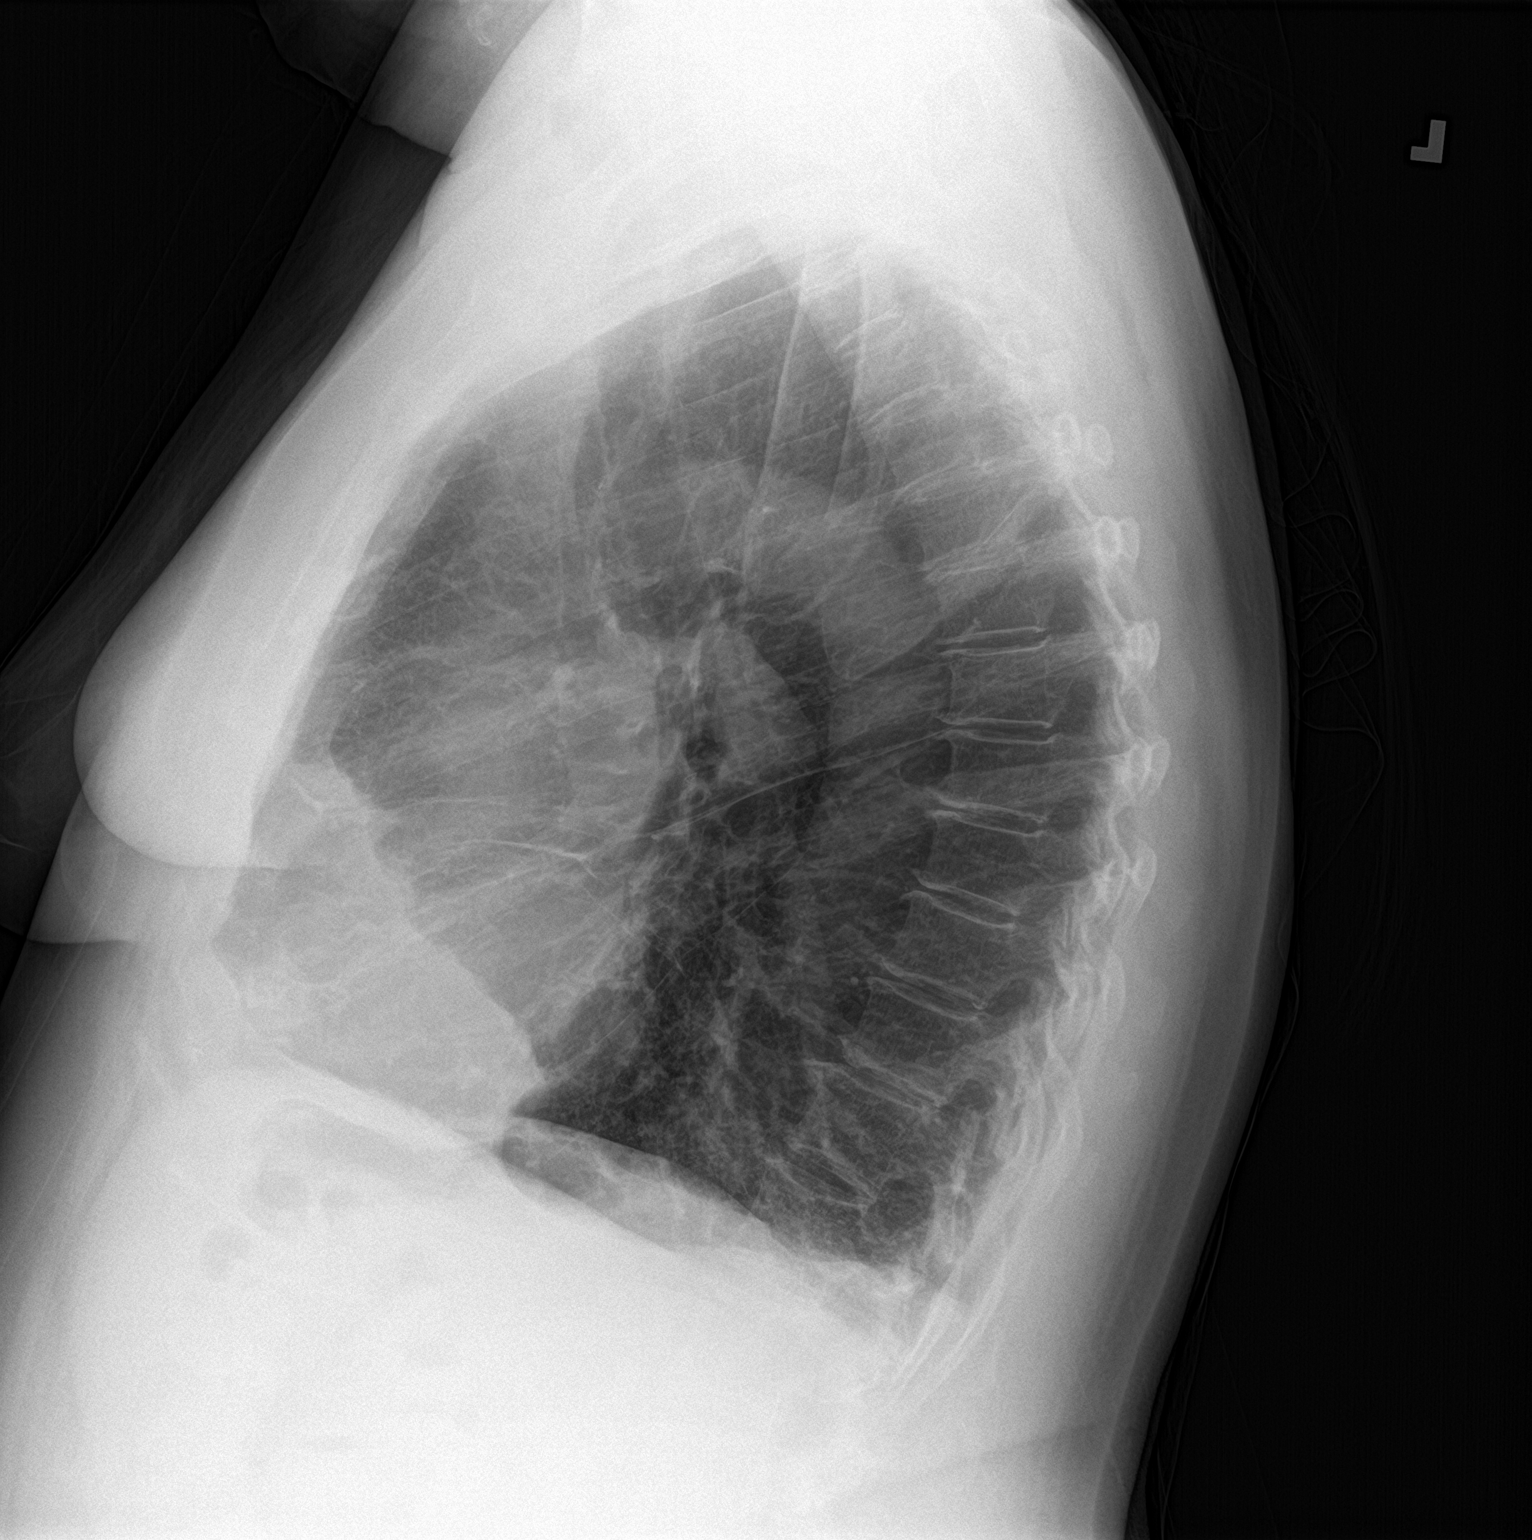

[2 of 2 positions shown; findings below may reference images not displayed]

FINDINGS: Background of emphysema. There are bibasilar linear
atelectasis/scarring. No focal consolidation, pleural effusion, or
pneumothorax. A 15 mm nodular density in the right mid lung field
was present on the radiograph of 4871 and may represent an area of
scarring or a partially calcified granuloma. CT may provide better
evaluation if clinically indicated. The cardiac silhouette is within
limits. No acute osseous pathology. Degenerative changes of the
spine.
IMPRESSION: 1. No acute cardiopulmonary process.
2. Emphysema.

## 2021-03-24 MED ORDER — DOXYCYCLINE HYCLATE 100 MG PO TABS: 100.0000 mg | ORAL_TABLET | Freq: Two times a day (BID) | ORAL | 0 refills | Status: AC

## 2021-03-24 MED ORDER — PREDNISONE 20 MG PO TABS: 40.0000 mg | ORAL_TABLET | Freq: Every day | ORAL | 0 refills | Status: AC

## 2021-03-24 NOTE — Progress Notes (Signed)
Acute Office Visit  Subjective:    Patient ID: Diana Solis, female    DOB: [birth year], 65 y.o.   MRN: [id number]  Chief Complaint  Patient presents with  . Cough    HPI Patient is in today for ongoing cough/URI.  Patient seen on 03/14/21 for 4 days of URI symptoms and encouraged to continue supportive therapy. On 03/18/21, patient reported she was not feeling any better and Augmentin was sent in. Patient reports she ended up having a day or two of feeling a little bit better, but now she feels worse again. Continues to have mild headache, 7/10 pansinusitis pressure, bilateral ear pressure - worse on left, productive cough with cloudy sputum, fatigue. No recent fevers, body aches, chest pain, baseline dyspnea per patient. Has been taking Mucinex DM and other OTC cough/cold medicines with little improvement. She gets very dyspneic on exertion. No audible wheezing per patient.    Chronic Disease Management; Routine Follow-up:  COPD - exacerbation now; taking breo and spiriva, albuterol occasionally (3x this week)  Hypothyroid - Compliant with synthroid. No complaints or concerns.   HSV - no recent outbreaks  HLD - tolerating atorvastatin. No adverse effects. Heart healthy diet, trying to stay active.   BP - good today, no medications. Does not monitor at home.    Past Medical History:  Diagnosis Date  . COPD (chronic obstructive pulmonary disease) (HCC)   . DIVERTICULOSIS, COLON 12/10/2010   Qualifier: Diagnosis of  By: Orson Aloe MD, Tinnie Gens    . GERD (gastroesophageal reflux disease)   . GLAUCOMA 09/29/2009   Qualifier: Diagnosis of  By: Linford Arnold MD, Santina Evans    . 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 Hypothyroid   . Urinary incontinence 10/19/2011    Past Surgical History:  Procedure Laterality Date  . BREAST CYST EXCISION    . BREAST EXCISIONAL BIOPSY    . INCONTINENCE SURGERY    . TONSILLECTOMY    . VAGINAL HYSTERECTOMY      Family History  Problem Relation Age  of Onset  . Asthma Mother   . Thyroid disease Mother   . Lung cancer Father        smoker  . Lupus Father     Social History   Socioeconomic History  . Marital status: Married    Spouse name: Diana Solis  . Number of children: 1  . Years of education: Not on file  . Highest education level: Not on file  Occupational History  . Not on file  Tobacco Use  . Smoking status: Former Smoker    Packs/day: 0.50    Years: 40.00    Pack years: 20.00    Types: Cigarettes  . Smokeless tobacco: Current User  . Tobacco comment: e-cig  Vaping Use  . Vaping Use: Never used  Substance and Sexual Activity  . Alcohol use: Yes    Alcohol/week: 6.0 standard drinks    Types: 6 Standard drinks or equivalent per week  . Drug use: No  . Sexual activity: Not on file  Other Topics Concern  . Not on file  Social History Narrative   No regular exercise. 3- 4 caffeine drinks per day. 3 grandkids.    Social Determinants of Health   Financial Resource Strain: Not on file  Food Insecurity: Not on file  Transportation Needs: Not on file  Physical Activity: Not on file  Stress: Not on file  Social Connections: Not on file  Intimate Partner Violence: Not on file  Outpatient Medications Prior to Visit  Medication Sig Dispense Refill  . albuterol (VENTOLIN HFA) 108 (90 Base) MCG/ACT inhaler Inhale 2 puffs into the lungs every 6 (six) hours as needed for wheezing or shortness of breath. 18 g 1  . atorvastatin (LIPITOR) 40 MG tablet TAKE 1 TABLET(40 MG) BY MOUTH DAILY 30 tablet 3  . bimatoprost (LUMIGAN) 0.03 % ophthalmic solution 1 drop at bedtime.    Marland Kitchen BREO ELLIPTA 200-25 MCG/INH AEPB INHALE 1 PUFF INTO THE LUNGS DAILY 180 each 1  . Esomeprazole Magnesium (NEXIUM PO) Take by mouth daily.    Marland Kitchen levothyroxine (SYNTHROID) 100 MCG tablet TAKE 1 TABLET BY MOUTH DAILY 90 tablet 1  . LUMIGAN 0.01 % SOLN Place 1 drop into both eyes at bedtime.   0  . Multiple Vitamin (MULTIVITAMIN) tablet Take 1 tablet by  mouth daily.    . polyethylene glycol powder (GLYCOLAX/MIRALAX) 17 GM/SCOOP powder Take 17 g by mouth daily. 850 g 12  . Tiotropium Bromide Monohydrate (SPIRIVA RESPIMAT) 1.25 MCG/ACT AERS Take 2 puffs by mouth daily. 4 g 12  . valACYclovir (VALTREX) 1000 MG tablet Take 1 tablet (1,000 mg total) by mouth daily. 90 tablet 1  . vitamin C (ASCORBIC ACID) 500 MG tablet Take 500 mg by mouth daily.     No facility-administered medications prior to visit.    No Known Allergies  Review of Systems All review of systems negative except what is listed in the HPI     Objective:    Physical Exam Vitals reviewed.  Constitutional:      General: She is not in acute distress.    Appearance: Normal appearance.  HENT:     Head: Normocephalic and atraumatic.     Right Ear: Tympanic membrane normal.     Left Ear: Tympanic membrane normal.     Nose: Nose normal.     Mouth/Throat:     Mouth: Mucous membranes are moist.     Pharynx: Oropharynx is clear.  Cardiovascular:     Rate and Rhythm: Normal rate and regular rhythm.     Pulses: Normal pulses.     Heart sounds: Normal heart sounds.  Pulmonary:     Effort: No respiratory distress.     Breath sounds: Normal breath sounds. No stridor. No wheezing, rhonchi or rales.     Comments: Diminished/constricted in bilateral bases Chest:     Chest wall: No tenderness.  Musculoskeletal:     Cervical back: Normal range of motion and neck supple. No tenderness.  Lymphadenopathy:     Cervical: No cervical adenopathy.  Skin:    General: Skin is warm and dry.     Findings: No rash.  Neurological:     General: No focal deficit present.     Mental Status: She is alert and oriented to person, place, and time. Mental status is at baseline.  Psychiatric:        Mood and Affect: Mood normal.        Behavior: Behavior normal.        Thought Content: Thought content normal.        Judgment: Judgment normal.     BP 131/71   Pulse 83   Temp 98.8 F (37.1  C)   Resp 18   SpO2 95%  Wt Readings from Last 3 Encounters:  10/12/20 172 lb (78 kg)  12/24/19 177 lb (80.3 kg)  07/09/19 175 lb (79.4 kg)    Health Maintenance Due  Topic Date Due  .  COVID-19 Vaccine (3 - Booster for Pfizer series) 07/25/2020  . TETANUS/TDAP  10/13/2020  . DEXA SCAN  Never done  . PNA vac Low Risk Adult (1 of 2 - PCV13) 03/11/2021    There are no preventive care reminders to display for this patient.   Lab Results  Component Value Date   TSH 3.00 10/20/2020   Lab Results  Component Value Date   WBC 6.1 10/20/2020   HGB 14.3 10/20/2020   HCT 40.9 10/20/2020   MCV 93.4 10/20/2020   PLT 265 10/20/2020   Lab Results  Component Value Date   NA 138 10/20/2020   K 4.7 10/20/2020   CO2 30 10/20/2020   GLUCOSE 116 (H) 10/20/2020   BUN 10 10/20/2020   CREATININE 0.68 10/20/2020   BILITOT 0.4 10/20/2020   ALKPHOS 80 03/15/2017   AST 18 10/20/2020   ALT 19 10/20/2020   PROT 7.2 10/20/2020   ALBUMIN 4.5 03/15/2017   CALCIUM 9.8 10/20/2020   Lab Results  Component Value Date   CHOL 160 10/20/2020   Lab Results  Component Value Date   HDL 70 10/20/2020   Lab Results  Component Value Date   LDLCALC 71 10/20/2020   Lab Results  Component Value Date   TRIG 105 10/20/2020   Lab Results  Component Value Date   CHOLHDL 2.3 10/20/2020   Lab Results  Component Value Date   HGBA1C 6.0 (H) 10/20/2020       Assessment & Plan:   1. Sinobronchitis 2. Acute bronchitis with COPD (HCC) Patient with sinobronchitis symptoms and COPD flare. Did not respond much to Augmentin. Will go ahead and give a course of steroids and doxycycline and get a chest x-ray. Continue other supportive measures and stay well hydrated and rested. Patient aware of signs and symptoms requiring urgent evaluation. Will update patient with x-ray results and any changes to plan of care.   - DG Chest 2 View; Future - doxycycline (VIBRA-TABS) 100 MG tablet; Take 1 tablet (100 mg  total) by mouth 2 (two) times daily for 7 days.  Dispense: 14 tablet; Refill: 0 - predniSONE (DELTASONE) 20 MG tablet; Take 2 tablets (40 mg total) by mouth daily with breakfast for 5 days.  Dispense: 10 tablet; Refill: 0   3. Hypothyroidism, unspecified type 4. HSV infection 5. Mixed hyperlipidemia  Chronic disease follow-up done at appointment today as well. VSS. No acute complaints or concerns regarding other chronic diseases. She is compliant with all meds and tolerating well. Continue healthy diet and daily physical activity. F/u with PCP in 2-3 months for assessment and labs or sooner if needed.    Clayborne Dana, NP

## 2021-03-28 ENCOUNTER — Ambulatory Visit: Payer: BC Managed Care – PPO | Admitting: Family Medicine

## 2021-03-28 NOTE — Progress Notes (Signed)
MyChart message sent: There were no new changes to your chest x-ray. Have you started feeling any better since starting the new antibiotic?

## 2021-04-05 ENCOUNTER — Other Ambulatory Visit: Payer: Self-pay | Admitting: Family Medicine

## 2021-04-05 DIAGNOSIS — B001 Herpesviral vesicular dermatitis: Secondary | ICD-10-CM

## 2021-05-26 ENCOUNTER — Encounter: Payer: Self-pay | Admitting: Family Medicine

## 2021-05-26 ENCOUNTER — Other Ambulatory Visit: Payer: Self-pay | Admitting: Family Medicine

## 2021-05-26 DIAGNOSIS — B001 Herpesviral vesicular dermatitis: Secondary | ICD-10-CM

## 2021-05-26 MED ORDER — VALACYCLOVIR HCL 1 G PO TABS: ORAL_TABLET | ORAL | 1 refills | Status: DC

## 2021-05-26 MED ORDER — ALBUTEROL SULFATE HFA 108 (90 BASE) MCG/ACT IN AERS: 2.0000 | INHALATION_SPRAY | Freq: Four times a day (QID) | RESPIRATORY_TRACT | 3 refills | Status: DC | PRN

## 2021-05-26 NOTE — Telephone Encounter (Signed)
New albuterol rx sent in.  Valtrex renewed

## 2021-05-31 ENCOUNTER — Other Ambulatory Visit: Payer: Self-pay

## 2021-05-31 MED ORDER — SPIRIVA RESPIMAT 1.25 MCG/ACT IN AERS: 2.0000 | INHALATION_SPRAY | Freq: Every day | RESPIRATORY_TRACT | 12 refills | Status: DC

## 2021-06-24 ENCOUNTER — Ambulatory Visit: Payer: Medicare Other | Admitting: Family Medicine

## 2021-07-02 ENCOUNTER — Other Ambulatory Visit: Payer: Self-pay | Admitting: Family Medicine

## 2021-07-02 DIAGNOSIS — J439 Emphysema, unspecified: Secondary | ICD-10-CM

## 2021-07-04 ENCOUNTER — Encounter: Payer: Self-pay | Admitting: Family Medicine

## 2021-07-13 ENCOUNTER — Ambulatory Visit (INDEPENDENT_AMBULATORY_CARE_PROVIDER_SITE_OTHER): Payer: Medicare Other | Admitting: Family Medicine

## 2021-07-13 ENCOUNTER — Other Ambulatory Visit: Payer: Self-pay

## 2021-07-13 ENCOUNTER — Encounter: Payer: Self-pay | Admitting: Family Medicine

## 2021-07-13 VITALS — BP 157/65 | HR 73 | Temp 97.2°F | Ht 64.0 in | Wt 166.0 lb

## 2021-07-13 DIAGNOSIS — Z780 Asymptomatic menopausal state: Secondary | ICD-10-CM

## 2021-07-13 DIAGNOSIS — Z13220 Encounter for screening for lipoid disorders: Secondary | ICD-10-CM

## 2021-07-13 DIAGNOSIS — R7303 Prediabetes: Secondary | ICD-10-CM | POA: Diagnosis not present

## 2021-07-13 DIAGNOSIS — E782 Mixed hyperlipidemia: Secondary | ICD-10-CM

## 2021-07-13 DIAGNOSIS — J439 Emphysema, unspecified: Secondary | ICD-10-CM

## 2021-07-13 DIAGNOSIS — B001 Herpesviral vesicular dermatitis: Secondary | ICD-10-CM

## 2021-07-13 DIAGNOSIS — Z0000 Encounter for general adult medical examination without abnormal findings: Secondary | ICD-10-CM | POA: Diagnosis not present

## 2021-07-13 DIAGNOSIS — Z13820 Encounter for screening for osteoporosis: Secondary | ICD-10-CM

## 2021-07-13 DIAGNOSIS — Z0001 Encounter for general adult medical examination with abnormal findings: Secondary | ICD-10-CM | POA: Diagnosis not present

## 2021-07-13 DIAGNOSIS — E039 Hypothyroidism, unspecified: Secondary | ICD-10-CM

## 2021-07-13 DIAGNOSIS — B009 Herpesviral infection, unspecified: Secondary | ICD-10-CM

## 2021-07-13 DIAGNOSIS — Z23 Encounter for immunization: Secondary | ICD-10-CM

## 2021-07-13 MED ORDER — LEVOTHYROXINE SODIUM 100 MCG PO TABS: 100.0000 ug | ORAL_TABLET | Freq: Every day | ORAL | 3 refills | Status: DC

## 2021-07-13 MED ORDER — ATORVASTATIN CALCIUM 40 MG PO TABS: ORAL_TABLET | ORAL | 3 refills | Status: DC

## 2021-07-13 MED ORDER — VALACYCLOVIR HCL 1 G PO TABS: ORAL_TABLET | ORAL | 1 refills | Status: DC

## 2021-07-13 NOTE — Patient Instructions (Addendum)
  Diana Solis , Thank you for taking time to come for your Medicare Wellness Visit. I appreciate your ongoing commitment to your health goals. Please review the following plan we discussed and let me know if I can assist you in the future.   These are the goals we discussed:  Goals   None     This is a list of the screening recommended for you and due dates:  Health Maintenance  Topic Date Due   Tetanus Vaccine  10/13/2020   DEXA scan (bone density measurement)  Never done   Pneumonia vaccines (1 of 2 - PCV13) 03/11/2021   Flu Shot  06/13/2021   Mammogram  04/15/2022   Colon Cancer Screening  03/27/2027   COVID-19 Vaccine  Completed   Hepatitis C Screening: USPSTF Recommendation to screen - Ages 18-79 yo.  Completed   HIV Screening  Completed   Zoster (Shingles) Vaccine  Completed   HPV Vaccine  Aged Out

## 2021-07-14 LAB — CBC WITH DIFFERENTIAL/PLATELET
Absolute Monocytes: 490 cells/uL (ref 200–950)
Basophils Absolute: 48 cells/uL (ref 0–200)
Basophils Relative: 1 %
Eosinophils Absolute: 130 cells/uL (ref 15–500)
Eosinophils Relative: 2.7 %
HCT: 43.3 % (ref 35.0–45.0)
Hemoglobin: 14.9 g/dL (ref 11.7–15.5)
Lymphs Abs: 1968 cells/uL (ref 850–3900)
MCH: 32.3 pg (ref 27.0–33.0)
MCHC: 34.4 g/dL (ref 32.0–36.0)
MCV: 93.9 fL (ref 80.0–100.0)
MPV: 10.1 fL (ref 7.5–12.5)
Monocytes Relative: 10.2 %
Neutro Abs: 2165 cells/uL (ref 1500–7800)
Neutrophils Relative %: 45.1 %
Platelets: 251 10*3/uL (ref 140–400)
RBC: 4.61 10*6/uL (ref 3.80–5.10)
RDW: 13 % (ref 11.0–15.0)
Total Lymphocyte: 41 %
WBC: 4.8 10*3/uL (ref 3.8–10.8)

## 2021-07-14 LAB — COMPLETE METABOLIC PANEL WITH GFR
AG Ratio: 2.1 (calc) (ref 1.0–2.5)
ALT: 21 U/L (ref 6–29)
AST: 20 U/L (ref 10–35)
Albumin: 4.9 g/dL (ref 3.6–5.1)
Alkaline phosphatase (APISO): 102 U/L (ref 37–153)
BUN: 13 mg/dL (ref 7–25)
CO2: 29 mmol/L (ref 20–32)
Calcium: 9.8 mg/dL (ref 8.6–10.4)
Chloride: 101 mmol/L (ref 98–110)
Creat: 0.6 mg/dL (ref 0.50–1.05)
Globulin: 2.3 g/dL (calc) (ref 1.9–3.7)
Glucose, Bld: 110 mg/dL — ABNORMAL HIGH (ref 65–99)
Potassium: 4.5 mmol/L (ref 3.5–5.3)
Sodium: 137 mmol/L (ref 135–146)
Total Bilirubin: 0.4 mg/dL (ref 0.2–1.2)
Total Protein: 7.2 g/dL (ref 6.1–8.1)
eGFR: 100 mL/min/{1.73_m2} (ref >=60)

## 2021-07-14 LAB — LIPID PANEL W/REFLEX DIRECT LDL
Cholesterol: 184 mg/dL (ref <200)
HDL: 77 mg/dL (ref >=50)
LDL Cholesterol (Calc): 86 mg/dL (calc)
Non-HDL Cholesterol (Calc): 107 mg/dL (calc) (ref <130)
Total CHOL/HDL Ratio: 2.4 (calc) (ref <5.0)
Triglycerides: 109 mg/dL (ref <150)

## 2021-07-14 LAB — TSH: TSH: 2.27 mIU/L (ref 0.40–4.50)

## 2021-07-14 LAB — HEMOGLOBIN A1C
Hgb A1c MFr Bld: 5.9 % of total Hgb — ABNORMAL HIGH (ref <5.7)
Mean Plasma Glucose: 123 mg/dL
eAG (mmol/L): 6.8 mmol/L

## 2021-07-18 NOTE — Assessment & Plan Note (Signed)
She is doing well with atorvastatin.  Update lipid panel.  We will continue atorvastatin at current strength for now.

## 2021-07-18 NOTE — Progress Notes (Signed)
Diana Solis - 65 y.o. female MRN [id number]  Date of birth: [birth year]  Subjective Chief Complaint  Patient presents with   Medicare Wellness    HPI Misty Stanley is following up for chronic conditions as well.  These are stable with current medications.  She continues on levothyroxine 100 mcg daily.  She feels the current dose is working well for her.  She denies side effects at this time.  She does have history of COPD.  She is a former smoker.  Currently using Spiriva and Breo daily with albuterol as needed.  Stable symptoms with current medications.  She is vaccinated against COVID with recommend boosters.  She is due for pneumonia vaccine.  She is also due for flu vaccine.  GERD remains well managed with Nexium.  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   Hypothyroid    Urinary incontinence 10/19/2011    Past Surgical History:  Procedure Laterality Date   BREAST CYST EXCISION     BREAST EXCISIONAL BIOPSY     INCONTINENCE SURGERY     TONSILLECTOMY     VAGINAL HYSTERECTOMY      Social History   Socioeconomic History   Marital status: Married    Spouse name: Darrel   Number of children: 1   Years of education: Not on file   Highest education level: Not on file  Occupational History   Not on file  Tobacco Use   Smoking status: Former    Packs/day: 0.50    Years: 40.00    Pack years: 20.00    Types: Cigarettes   Smokeless tobacco: Current   Tobacco comments:    e-cig  Vaping Use   Vaping Use: Never used  Substance and Sexual Activity   Alcohol use: Yes    Alcohol/week:  6.0 standard drinks    Types: 6 Standard drinks or equivalent per week   Drug use: No   Sexual activity: Not on file  Other Topics Concern   Not on file  Social History Narrative   No regular exercise. 3- 4 caffeine drinks per day. 3 grandkids.    Social Determinants of Health   Financial Resource Strain: Not on file  Food Insecurity: Not on file  Transportation Needs: Not on file  Physical Activity: Not on file  Stress: Not on file  Social Connections: Not on file    Family History  Problem Relation Age of Onset   Asthma Mother    Thyroid disease Mother    Lung cancer Father        smoker   Lupus Father     Health Maintenance  Topic Date Due   TETANUS/TDAP  10/13/2020   DEXA SCAN  Never done   PNA vac Low Risk Adult (1 of 2 - PCV13) 03/11/2021   MAMMOGRAM  04/15/2022   COLONOSCOPY (Pts 45-52yrs Insurance coverage will need to be confirmed)  03/27/2027   INFLUENZA VACCINE  Completed   COVID-19 Vaccine  Completed   Hepatitis C Screening  Completed   HIV Screening  Completed   Zoster Vaccines-  Shingrix  Completed   HPV VACCINES  Aged Out     ----------------------------------------------------------------------------------------------------------------------------------------------------------------------------------------------------------------- Physical Exam BP (!) 157/65 (BP Location: Left Arm, Patient Position: Sitting, Cuff Size: Normal)   Pulse 73   Temp (!) 97.2 F (36.2 C)   Ht 5\' 4"  (1.626 m)   Wt 166 lb (75.3 kg)   SpO2 94%   BMI 28.49 kg/m   Physical Exam  ------------------------------------------------------------------------------------------------------------------------------------------------------------------------------------------------------------------- Assessment and Plan  Chronic obstructive pulmonary disease (HCC) Symptoms are well controlled with Spiriva and Breo daily.  She uses albuterol as needed.  Hypothyroidism Updating  TSH today.  She feels good at current dose of levothyroxine.  HLD (hyperlipidemia) She is doing well with atorvastatin.  Update lipid panel.  We will continue atorvastatin at current strength for now.  Prediabetes Update A1c today.  HSV infection Valtrex renewed.   Meds ordered this encounter  Medications   valACYclovir (VALTREX) 1000 MG tablet    Sig: TAKE 1 TABLET(1000 MG) BY MOUTH DAILY    Dispense:  90 tablet    Refill:  1   atorvastatin (LIPITOR) 40 MG tablet    Sig: TAKE 1 TABLET(40 MG) BY MOUTH DAILY    Dispense:  90 tablet    Refill:  3   levothyroxine (SYNTHROID) 100 MCG tablet    Sig: Take 1 tablet (100 mcg total) by mouth daily.    Dispense:  90 tablet    Refill:  3    Return in about 6 months (around 01/10/2022) for HTN/Hypothyroid.    This visit occurred during the SARS-CoV-2 public health emergency.  Safety protocols were in place, including screening questions prior to the visit, additional usage of staff PPE, and extensive cleaning of exam room while observing appropriate contact time as indicated for disinfecting solutions.

## 2021-07-18 NOTE — Assessment & Plan Note (Signed)
Updating TSH today.  She feels good at current dose of levothyroxine.

## 2021-07-18 NOTE — Assessment & Plan Note (Signed)
Valtrex renewed.  

## 2021-07-18 NOTE — Assessment & Plan Note (Signed)
Update A1c today 

## 2021-07-18 NOTE — Progress Notes (Signed)
Medicare Annual Wellness Visit  07/18/2021  Subjective:  Diana Solis is a 65 y.o. female primary care patient of Luetta Nutting, DO who had a Medicare Annual Wellness Visit today via telephone. Shaida is Retired and lives with their spouse.  she reports that she is socially active and does interact with friends/family regularly. she is moderately physically active and enjoys reading.  Patient Care Team: Luetta Nutting, DO as PCP - General (Family Medicine)  Advanced Directives 05/17/2017  Does Patient Have a Medical Advance Directive? Yes  Type of Paramedic of [place];Living will  Copy of [place] in Chart? No - copy requested    Hospital Utilization Over the Past 12 Months: # of hospitalizations or ER visits: 0 # of surgeries: 0  Review of Systems    Patient reports that her overall health is unchanged compared to last year.  Review of Systems  Constitutional:  Negative for chills, fever, malaise/fatigue and weight loss.  HENT:  Negative for congestion, ear pain and sore throat.   Eyes:  Negative for blurred vision, double vision and pain.  Respiratory:  Negative for cough and shortness of breath.   Cardiovascular:  Negative for chest pain and palpitations.  Gastrointestinal:  Negative for abdominal pain, blood in stool, constipation, heartburn and nausea.  Genitourinary:  Negative for dysuria and urgency.  Musculoskeletal:  Negative for joint pain and myalgias.  Neurological:  Negative for dizziness and headaches.  Endo/Heme/Allergies:  Does not bruise/bleed easily.  Psychiatric/Behavioral:  Negative for depression. The patient is not nervous/anxious and does not have insomnia.         Current Medications & Allergies (verified) Allergies as of 07/13/2021   No Known Allergies      Medication List        Accurate as of July 13, 2021 11:59 PM. If you have any questions, ask your nurse or doctor.          albuterol  108 (90 Base) MCG/ACT inhaler Commonly known as: VENTOLIN HFA Inhale 2 puffs into the lungs every 6 (six) hours as needed for wheezing or shortness of breath.   atorvastatin 40 MG tablet Commonly known as: LIPITOR TAKE 1 TABLET(40 MG) BY MOUTH DAILY   bimatoprost 0.03 % ophthalmic solution Commonly known as: LUMIGAN 1 drop at bedtime. What changed: Another medication with the same name was removed. Continue taking this medication, and follow the directions you see here. Changed by: Luetta Nutting, DO   fluticasone furoate-vilanterol 200-25 MCG/INH Aepb Commonly known as: BREO ELLIPTA INHALE 1 PUFF INTO THE LUNGS DAILY What changed: Another medication with the same name was removed. Continue taking this medication, and follow the directions you see here. Changed by: Luetta Nutting, DO   levothyroxine 100 MCG tablet Commonly known as: SYNTHROID Take 1 tablet (100 mcg total) by mouth daily.   multivitamin tablet Take 1 tablet by mouth daily.   NEXIUM PO Take by mouth daily.   polyethylene glycol powder 17 GM/SCOOP powder Commonly known as: GLYCOLAX/MIRALAX Take 17 g by mouth daily.   Spiriva Respimat 1.25 MCG/ACT Aers Generic drug: Tiotropium Bromide Monohydrate Take 2 puffs by mouth daily.   valACYclovir 1000 MG tablet Commonly known as: VALTREX TAKE 1 TABLET(1000 MG) BY MOUTH DAILY   vitamin C 500 MG tablet Commonly known as: ASCORBIC ACID Take 500 mg by mouth daily.        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   Hypothyroid    Urinary incontinence 10/19/2011   Past Surgical History:  Procedure Laterality Date   BREAST CYST EXCISION      BREAST EXCISIONAL BIOPSY     INCONTINENCE SURGERY     TONSILLECTOMY     VAGINAL HYSTERECTOMY     Family History  Problem Relation Age of Onset   Asthma Mother    Thyroid disease Mother    Lung cancer Father        smoker   Lupus Father    Social History   Socioeconomic History   Marital status: Married    Spouse name: Darrel   Number of children: 1   Years of education: Not on file   Highest education level: Not on file  Occupational History   Not on file  Tobacco Use   Smoking status: Former    Packs/day: 0.50    Years: 40.00    Pack years: 20.00    Types: Cigarettes   Smokeless tobacco: Current   Tobacco comments:    e-cig  Vaping Use   Vaping Use: Never used  Substance and Sexual Activity   Alcohol use: Yes    Alcohol/week: 6.0 standard drinks    Types: 6 Standard drinks or equivalent per week   Drug use: No   Sexual activity: Not on file  Other Topics Concern   Not on file  Social History Narrative   No regular exercise. 3- 4 caffeine drinks per day. 3 grandkids.    Social Determinants of Health   Financial Resource Strain: Not on file  Food Insecurity: Not on file  Transportation Needs: Not on file  Physical Activity: Not on file  Stress: Not on file  Social Connections: Not on file    Activities of Daily Living In your present state of health, do you have any difficulty performing the following activities: 07/18/2021  Hearing? N  Vision? N  Difficulty concentrating or making decisions? N  Walking or climbing stairs? N  Dressing or bathing? N  Doing errands, shopping? N  Some recent data might be hidden      Exercise Current Exercise Habits: Home exercise routine, Type of exercise: stretching;walking, Time (Minutes): 20, Frequency (Times/Week): 3, Weekly Exercise (Minutes/Week): 60, Intensity: Mild, Exercise limited by: respiratory conditions(s)  Diet Patient reports consuming 3 meals a day and 2 snack(s) a day Patient reports that her  primary diet is: Regular Patient reports that she does have regular access to food.   Depression Screen PHQ 2/9 Scores 07/13/2021 10/12/2020 12/24/2019 02/05/2019 03/18/2018 12/18/2017  PHQ - 2 Score 0 0 - 2 2 3   PHQ- 9 Score - 1 - 5 4 8   Exception Documentation - - Patient refusal - - -     Fall Risk Fall Risk  07/13/2021 10/12/2020 03/18/2018 12/18/2017  Falls in the past year? 0 0 No No  Number falls in past yr: 0 0 - -  Injury with Fall? 0 0 - -  Risk for fall due to : No Fall Risks No Fall Risks - -  Follow up Falls evaluation completed Falls evaluation completed - -      Exercise    Diet Patient reports consuming 3 meals a day and 2  snack(s) a day Patient reports that her primary diet is: Regular Patient reports that she does have regular access to food.   Depression Screen PHQ 2/9 Scores 07/13/2021 10/12/2020 12/24/2019 02/05/2019 03/18/2018 12/18/2017  PHQ - 2 Score 0 0 - 2 2 3   PHQ- 9 Score - 1 - 5 4 8   Exception Documentation - - Patient refusal - - -     Fall Risk Fall Risk  07/13/2021 10/12/2020 03/18/2018 12/18/2017  Falls in the past year? 0 0 No No  Number falls in past yr: 0 0 - -  Injury with Fall? 0 0 - -  Risk for fall due to : No Fall Risks No Fall Risks - -  Follow up Falls evaluation completed Falls evaluation completed - -     Objective:    Ms. Moilanen was alert and able to participate appropriately during our visit.   Today's Vitals   07/13/21 1052  BP: (!) 157/65  Pulse: 73  Temp: (!) 97.2 F (36.2 C)  SpO2: 94%  Weight: 166 lb (75.3 kg)  Height: 5' 4"  (1.626 m)   Body mass index is 28.49 kg/m.  Advanced Directives 05/17/2017  Does Patient Have a Medical Advance Directive? Yes  Type of Paramedic of [place];Living will  Copy of [place] in Chart? No - copy requested    Hearing/Vision  Brelee did not seem to have difficulty with hearing/understanding during the face to face visit Reports that she has not  had a formal eye exam by an eye care professional within the past year Reports that she has not had a formal hearing evaluation within the past year  Cognitive Function:  Mini-Cog - 07/18/21 2226     Normal clock drawing test? yes    How many words correct? 3             (Normal:0-7, Significant for Dysfunction: >8)  Normal Cognitive Function Screening: Yes  No flowsheet data found.   Immunization & Health Maintenance Record Immunization History  Administered Date(s) Administered   Fluad Quad(high Dose 65+) 07/13/2021   Influenza Split 10/02/2011, 08/24/2012   Influenza Whole 10/13/2010   Influenza,inj,Quad PF,6+ Mos 08/21/2013, 08/27/2014, 07/28/2015, 08/15/2017, 07/25/2018, 07/09/2019   MMR 03/25/2018   PFIZER(Purple Top)SARS-COV-2 Vaccination 01/29/2020, 02/23/2020, 09/20/2020, 05/26/2021   PNEUMOCOCCAL CONJUGATE-20 07/13/2021   PPD Test 08/25/2015   Pneumococcal Polysaccharide-23 03/31/2014   Td 10/13/2010   Zoster Recombinat (Shingrix) 05/18/2017, 08/13/2017   Zoster, Live 07/24/2016    Health Maintenance  Topic Date Due   TETANUS/TDAP  10/13/2020   DEXA SCAN  Never done   PNA vac Low Risk Adult (1 of 2 - PCV13) 03/11/2021   MAMMOGRAM  04/15/2022   COLONOSCOPY (Pts 45-47yr Insurance coverage will need to be confirmed)  03/27/2027   INFLUENZA VACCINE  Completed   COVID-19 Vaccine  Completed   Hepatitis C Screening  Completed   HIV Screening  Completed   Zoster Vaccines- Shingrix  Completed   HPV VACCINES  Aged Out       Assessment:  This is a routine wellness examination for [employer]  Health Maintenance: Due or Overdue Health Maintenance Due  Topic Date Due   TETANUS/TDAP  10/13/2020   DEXA SCAN  Never done   PNA vac Low Risk Adult (1 of 2 - PCV13) 03/11/2021    LForde Dandydoes not need a referral for Community Assistance: Care Management:   no Social Work:    no Prescription Assistance:  no Nutrition/Diabetes Education:  no   Plan:   Personalized Goals  Goals Addressed   None    Personalized Health Maintenance & Screening Recommendations  Pneumococcal vaccine   Lung Cancer Screening Recommended: no (Low Dose CT Chest recommended if Age 48-80 years, 30 pack-year currently smoking OR have quit w/in past 15 years) Hepatitis C Screening recommended: yes HIV Screening recommended: yes  Advanced Directives: Written information was prepared per patient's request.  Referrals & Orders Orders Placed This Encounter  Procedures   DG Bone Density   Flu Vaccine QUAD High Dose(Fluad)   Pneumococcal conjugate vaccine 20-valent (Prevnar 20)   COMPLETE METABOLIC PANEL WITH GFR   CBC with Differential   Lipid Panel w/reflex Direct LDL   TSH   HgB A1c    Follow-up Plan Follow-up with Luetta Nutting, DO as planned Return in about 6 months (around 01/10/2022) for HTN/Hypothyroid.    I have personally reviewed and noted the following in the patient's chart:   Medical and social history Use of alcohol, tobacco or illicit drugs  Current medications and supplements Functional ability and status Nutritional status Physical activity Advanced directives List of other physicians Hospitalizations, surgeries, and ER visits in previous 12 months Vitals Screenings to include cognitive, depression, and falls Referrals and appointments  In addition, I have reviewed and discussed with Forde Dandy certain preventive protocols, quality metrics, and best practice recommendations. A written personalized care plan for preventive services as well as general preventive health recommendations is available and can be mailed to the patient at her request.      Luetta Nutting  07/18/2021

## 2021-07-18 NOTE — Assessment & Plan Note (Signed)
Symptoms are well controlled with Spiriva and Breo daily.  She uses albuterol as needed.

## 2021-09-28 DIAGNOSIS — H524 Presbyopia: Secondary | ICD-10-CM | POA: Diagnosis not present

## 2021-09-28 DIAGNOSIS — H3550 Unspecified hereditary retinal dystrophy: Secondary | ICD-10-CM | POA: Diagnosis not present

## 2021-10-12 ENCOUNTER — Other Ambulatory Visit: Payer: Medicare Other

## 2021-11-02 ENCOUNTER — Other Ambulatory Visit: Payer: Self-pay | Admitting: Radiology

## 2021-11-23 ENCOUNTER — Other Ambulatory Visit: Payer: Medicare Other

## 2021-12-28 ENCOUNTER — Other Ambulatory Visit: Payer: Self-pay

## 2021-12-28 ENCOUNTER — Ambulatory Visit (INDEPENDENT_AMBULATORY_CARE_PROVIDER_SITE_OTHER): Payer: Medicare Other

## 2021-12-28 DIAGNOSIS — Z13820 Encounter for screening for osteoporosis: Secondary | ICD-10-CM

## 2021-12-28 DIAGNOSIS — Z780 Asymptomatic menopausal state: Secondary | ICD-10-CM

## 2021-12-28 DIAGNOSIS — M810 Age-related osteoporosis without current pathological fracture: Secondary | ICD-10-CM | POA: Diagnosis not present

## 2021-12-28 DIAGNOSIS — M85851 Other specified disorders of bone density and structure, right thigh: Secondary | ICD-10-CM | POA: Diagnosis not present

## 2022-02-08 DIAGNOSIS — H401132 Primary open-angle glaucoma, bilateral, moderate stage: Secondary | ICD-10-CM | POA: Diagnosis not present

## 2022-05-24 DIAGNOSIS — H401132 Primary open-angle glaucoma, bilateral, moderate stage: Secondary | ICD-10-CM | POA: Diagnosis not present

## 2022-06-13 ENCOUNTER — Encounter: Payer: Self-pay | Admitting: Family Medicine

## 2022-06-14 ENCOUNTER — Other Ambulatory Visit: Payer: Self-pay

## 2022-06-14 MED ORDER — SPIRIVA RESPIMAT 1.25 MCG/ACT IN AERS: 2.0000 | INHALATION_SPRAY | Freq: Every day | RESPIRATORY_TRACT | 0 refills | Status: DC

## 2022-06-14 MED ORDER — ATORVASTATIN CALCIUM 40 MG PO TABS: ORAL_TABLET | ORAL | 0 refills | Status: DC

## 2022-06-20 ENCOUNTER — Other Ambulatory Visit: Payer: Self-pay | Admitting: Family Medicine

## 2022-06-23 ENCOUNTER — Telehealth: Payer: Self-pay | Admitting: Family Medicine

## 2022-06-23 DIAGNOSIS — B001 Herpesviral vesicular dermatitis: Secondary | ICD-10-CM

## 2022-06-23 NOTE — Telephone Encounter (Signed)
Pt scheduled a F/u with her pcp for 07/19/22 but will need her Valtrex refilled before then.

## 2022-06-26 MED ORDER — VALACYCLOVIR HCL 1 G PO TABS: ORAL_TABLET | ORAL | 1 refills | Status: DC

## 2022-06-26 NOTE — Telephone Encounter (Signed)
Completed.

## 2022-07-05 DIAGNOSIS — K439 Ventral hernia without obstruction or gangrene: Secondary | ICD-10-CM | POA: Diagnosis not present

## 2022-07-14 ENCOUNTER — Other Ambulatory Visit: Payer: Self-pay

## 2022-07-14 DIAGNOSIS — J439 Emphysema, unspecified: Secondary | ICD-10-CM

## 2022-07-14 MED ORDER — FLUTICASONE FUROATE-VILANTEROL 100-25 MCG/ACT IN AEPB
1.0000 | INHALATION_SPRAY | Freq: Every day | RESPIRATORY_TRACT | 11 refills | Status: DC
Start: 2022-07-14 — End: 2023-07-20

## 2022-07-14 MED ORDER — SPIRIVA RESPIMAT 1.25 MCG/ACT IN AERS: 2.0000 | INHALATION_SPRAY | Freq: Every day | RESPIRATORY_TRACT | 0 refills | Status: DC

## 2022-07-19 ENCOUNTER — Encounter: Payer: Self-pay | Admitting: Family Medicine

## 2022-07-19 ENCOUNTER — Ambulatory Visit (INDEPENDENT_AMBULATORY_CARE_PROVIDER_SITE_OTHER): Payer: Medicare Other | Admitting: Family Medicine

## 2022-07-19 VITALS — BP 123/56 | HR 70 | Ht 64.0 in | Wt 172.0 lb

## 2022-07-19 DIAGNOSIS — E782 Mixed hyperlipidemia: Secondary | ICD-10-CM

## 2022-07-19 DIAGNOSIS — J439 Emphysema, unspecified: Secondary | ICD-10-CM

## 2022-07-19 DIAGNOSIS — R7303 Prediabetes: Secondary | ICD-10-CM

## 2022-07-19 DIAGNOSIS — E039 Hypothyroidism, unspecified: Secondary | ICD-10-CM

## 2022-07-19 MED ORDER — ATORVASTATIN CALCIUM 40 MG PO TABS: ORAL_TABLET | ORAL | 0 refills | Status: DC

## 2022-07-19 MED ORDER — TRELEGY ELLIPTA 100-62.5-25 MCG/ACT IN AEPB: 1.0000 | INHALATION_SPRAY | Freq: Every day | RESPIRATORY_TRACT | 11 refills | Status: DC

## 2022-07-19 MED ORDER — PANTOPRAZOLE SODIUM 40 MG PO TBEC: 40.0000 mg | DELAYED_RELEASE_TABLET | Freq: Every day | ORAL | 3 refills | Status: DC

## 2022-07-19 MED ORDER — LEVOTHYROXINE SODIUM 100 MCG PO TABS
100.0000 ug | ORAL_TABLET | Freq: Every day | ORAL | 3 refills | Status: DC
Start: 2022-07-19 — End: 2023-07-26

## 2022-07-19 MED ORDER — ALBUTEROL SULFATE HFA 108 (90 BASE) MCG/ACT IN AERS: 2.0000 | INHALATION_SPRAY | Freq: Four times a day (QID) | RESPIRATORY_TRACT | 3 refills | Status: AC | PRN

## 2022-07-19 NOTE — Assessment & Plan Note (Signed)
Update A1c today 

## 2022-07-19 NOTE — Assessment & Plan Note (Signed)
Rechecking TSH today.  This has been stable.

## 2022-07-19 NOTE — Assessment & Plan Note (Signed)
Adding Trelegy to replace Breo and Spiriva.  Continue albuterol as needed.

## 2022-07-19 NOTE — Patient Instructions (Signed)
Great to see you! I would like for you to try Trelegy.  This will replace Breo and Spiriva.  We'll be in touch with lab results

## 2022-07-19 NOTE — Progress Notes (Signed)
Diana Solis - 66 y.o. female MRN [id number]  Date of birth: 1956-09-15  Subjective Chief Complaint  Patient presents with   COPD    HPI Diana Solis is a 66 y.o. female here today for follow-up.  History of COPD that is managed with daily Breo, Spiriva and albuterol as needed.  She does feel these are helpful but still has some breakthrough symptoms.  She would like to try to minimize inhalers if possible.  She has not had chest pain, increased wheezing or sputum production or shortness of breath.  She would like to try something other than Nexium for her chronic GERD.  She had seen some concerns regarding Nexium is causing dementia.  Feels good with current dose of levothyroxine.  She is due for updated labs.  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   Hypothyroid    Urinary incontinence 10/19/2011    Past Surgical History:  Procedure Laterality Date   BREAST CYST EXCISION     BREAST EXCISIONAL BIOPSY     INCONTINENCE SURGERY     TONSILLECTOMY     VAGINAL HYSTERECTOMY      Social History   Socioeconomic History   Marital status: Married    Spouse name: Darrel   Number of children: 1   Years of education: Not on file   Highest education level: Not on file  Occupational History   Not on file  Tobacco Use   Smoking status: Former    Packs/day: 0.50    Years: 40.00    Total pack years: 20.00    Types: Cigarettes   Smokeless tobacco: Current   Tobacco comments:    e-cig  Vaping Use   Vaping Use: Never used  Substance and Sexual Activity   Alcohol use: Yes     Alcohol/week: 6.0 standard drinks of alcohol    Types: 6 Standard drinks or equivalent per week   Drug use: No   Sexual activity: Not on file  Other Topics Concern   Not on file  Social History Narrative   No regular exercise. 3- 4 caffeine drinks per day. 3 grandkids.    Social Determinants of Health   Financial Resource Strain: Not on file  Food Insecurity: Not on file  Transportation Needs: Not on file  Physical Activity: Not on file  Stress: Not on file  Social Connections: Not on file    Family History  Problem Relation Age of Onset   Asthma Mother    Thyroid disease Mother    Lung cancer Father        smoker   Lupus Father     Health Maintenance  Topic Date Due   INFLUENZA VACCINE  02/11/2023 (Originally 06/13/2022)   MAMMOGRAM  07/20/2023 (Originally 04/15/2022)   TETANUS/TDAP  08/05/2023 (Originally 10/13/2020)   COVID-19 Vaccine (7 - Pfizer series) 08/05/2023 (Originally 09/25/2022)   COLONOSCOPY (Pts 45-28yrs Insurance coverage will need to be confirmed)  03/27/2027   Pneumonia Vaccine 56+ Years old  Completed   DEXA SCAN  Completed   Hepatitis C Screening  Completed   Zoster Vaccines- Shingrix  Completed   HPV VACCINES  Aged Out     ----------------------------------------------------------------------------------------------------------------------------------------------------------------------------------------------------------------- Physical Exam BP (!) 123/56 (BP Location: Left Arm, Patient Position: Sitting, Cuff Size: Normal)   Pulse 70   Ht 5\' 4"  (1.626 m)   Wt 172 lb (78 kg)   SpO2 93%   BMI 29.52 kg/m   Physical Exam Constitutional:      Appearance: Normal appearance.  Eyes:     General: No scleral icterus. Cardiovascular:     Rate and Rhythm: Normal rate and regular rhythm.  Pulmonary:     Effort: Pulmonary effort is normal.     Breath sounds: Normal breath sounds.  Musculoskeletal:     Cervical back: Neck supple.  Neurological:      Mental Status: She is alert.  Psychiatric:        Mood and Affect: Mood normal.        Behavior: Behavior normal.     ------------------------------------------------------------------------------------------------------------------------------------------------------------------------------------------------------------------- Assessment and Plan  Chronic obstructive pulmonary disease (HCC) Adding Trelegy to replace Breo and Spiriva.  Continue albuterol as needed.  Prediabetes Update A1c today.  Hypothyroidism Rechecking TSH today.  This has been stable.   Meds ordered this encounter  Medications   atorvastatin (LIPITOR) 40 MG tablet    Sig: TAKE 1 TABLET(40 MG) BY MOUTH DAILY, pt needs an appointment    Dispense:  90 tablet    Refill:  0   levothyroxine (SYNTHROID) 100 MCG tablet    Sig: Take 1 tablet (100 mcg total) by mouth daily.    Dispense:  90 tablet    Refill:  3   albuterol (VENTOLIN HFA) 108 (90 Base) MCG/ACT inhaler    Sig: Inhale 2 puffs into the lungs every 6 (six) hours as needed for wheezing or shortness of breath.    Dispense:  8 g    Refill:  3    Ok to substitute for preferred brand of albuterol.   Fluticasone-Umeclidin-Vilant (TRELEGY ELLIPTA) 100-62.5-25 MCG/ACT AEPB    Sig: Inhale 1 puff into the lungs daily.    Dispense:  1 each    Refill:  11   pantoprazole (PROTONIX) 40 MG tablet    Sig: Take 1 tablet (40 mg total) by mouth daily.    Dispense:  30 tablet    Refill:  3    No follow-ups on file.    This visit occurred during the SARS-CoV-2 public health emergency.  Safety protocols were in place, including screening questions prior to the visit, additional usage of staff PPE, and extensive cleaning of exam room while observing appropriate contact time as indicated for disinfecting solutions.

## 2022-07-20 LAB — CBC WITH DIFFERENTIAL/PLATELET
Absolute Monocytes: 442 cells/uL (ref 200–950)
Basophils Absolute: 62 cells/uL (ref 0–200)
Basophils Relative: 1.3 %
Eosinophils Absolute: 202 cells/uL (ref 15–500)
Eosinophils Relative: 4.2 %
HCT: 41.6 % (ref 35.0–45.0)
Hemoglobin: 14.4 g/dL (ref 11.7–15.5)
Lymphs Abs: 1906 cells/uL (ref 850–3900)
MCH: 32.2 pg (ref 27.0–33.0)
MCHC: 34.6 g/dL (ref 32.0–36.0)
MCV: 93.1 fL (ref 80.0–100.0)
MPV: 9.6 fL (ref 7.5–12.5)
Monocytes Relative: 9.2 %
Neutro Abs: 2189 cells/uL (ref 1500–7800)
Neutrophils Relative %: 45.6 %
Platelets: 263 10*3/uL (ref 140–400)
RBC: 4.47 10*6/uL (ref 3.80–5.10)
RDW: 12.4 % (ref 11.0–15.0)
Total Lymphocyte: 39.7 %
WBC: 4.8 10*3/uL (ref 3.8–10.8)

## 2022-07-20 LAB — COMPLETE METABOLIC PANEL WITH GFR
AG Ratio: 1.9 (calc) (ref 1.0–2.5)
ALT: 19 U/L (ref 6–29)
AST: 16 U/L (ref 10–35)
Albumin: 4.6 g/dL (ref 3.6–5.1)
Alkaline phosphatase (APISO): 102 U/L (ref 37–153)
BUN: 10 mg/dL (ref 7–25)
CO2: 26 mmol/L (ref 20–32)
Calcium: 9.5 mg/dL (ref 8.6–10.4)
Chloride: 103 mmol/L (ref 98–110)
Creat: 0.6 mg/dL (ref 0.50–1.05)
Globulin: 2.4 g/dL (calc) (ref 1.9–3.7)
Glucose, Bld: 99 mg/dL (ref 65–139)
Potassium: 4.4 mmol/L (ref 3.5–5.3)
Sodium: 139 mmol/L (ref 135–146)
Total Bilirubin: 0.3 mg/dL (ref 0.2–1.2)
Total Protein: 7 g/dL (ref 6.1–8.1)
eGFR: 99 mL/min/{1.73_m2} (ref >=60)

## 2022-07-20 LAB — LIPID PANEL W/REFLEX DIRECT LDL
Cholesterol: 165 mg/dL (ref <200)
HDL: 74 mg/dL (ref >=50)
LDL Cholesterol (Calc): 74 mg/dL (calc)
Non-HDL Cholesterol (Calc): 91 mg/dL (calc) (ref <130)
Total CHOL/HDL Ratio: 2.2 (calc) (ref <5.0)
Triglycerides: 91 mg/dL (ref <150)

## 2022-07-20 LAB — HEMOGLOBIN A1C
Hgb A1c MFr Bld: 5.9 % of total Hgb — ABNORMAL HIGH (ref <5.7)
Mean Plasma Glucose: 123 mg/dL
eAG (mmol/L): 6.8 mmol/L

## 2022-07-20 LAB — TSH: TSH: 1.51 mIU/L (ref 0.40–4.50)

## 2022-08-03 ENCOUNTER — Encounter: Payer: Self-pay | Admitting: Family Medicine

## 2022-09-04 ENCOUNTER — Other Ambulatory Visit: Payer: Self-pay | Admitting: Family Medicine

## 2022-09-04 DIAGNOSIS — J439 Emphysema, unspecified: Secondary | ICD-10-CM

## 2022-09-04 MED ORDER — SPIRIVA RESPIMAT 1.25 MCG/ACT IN AERS: 2.0000 | INHALATION_SPRAY | Freq: Every day | RESPIRATORY_TRACT | 0 refills | Status: DC

## 2022-09-04 NOTE — Telephone Encounter (Signed)
Per [facility]/patient:  "I have tried Trelegy and it is not working as well as Lawyer. Please refill asap as I am going out of town on Tuesday. Thank you".

## 2022-09-15 DIAGNOSIS — Z87891 Personal history of nicotine dependence: Secondary | ICD-10-CM | POA: Diagnosis not present

## 2022-09-15 DIAGNOSIS — J449 Chronic obstructive pulmonary disease, unspecified: Secondary | ICD-10-CM | POA: Diagnosis not present

## 2022-09-21 ENCOUNTER — Encounter (INDEPENDENT_AMBULATORY_CARE_PROVIDER_SITE_OTHER): Payer: Medicare Other | Admitting: Family Medicine

## 2022-09-21 DIAGNOSIS — U071 COVID-19: Secondary | ICD-10-CM

## 2022-09-22 ENCOUNTER — Other Ambulatory Visit: Payer: Self-pay | Admitting: Family Medicine

## 2022-09-22 MED ORDER — NIRMATRELVIR/RITONAVIR (PAXLOVID)TABLET: 3.0000 | ORAL_TABLET | Freq: Two times a day (BID) | ORAL | 0 refills | Status: AC

## 2022-09-22 NOTE — Telephone Encounter (Signed)
Please see the MyChart message reply(ies) for my assessment and plan.    This patient gave consent for this Medical Advice Message and is aware that it may result in a bill to their insurance company, as well as the possibility of receiving a bill for a co-payment or deductible. They are an established patient, but are not seeking medical advice exclusively about a problem treated during an in person or video visit in the last seven days. I did not recommend an in person or video visit within seven days of my reply.    I spent a total of 8 minutes cumulative time within 7 days through MyChart messaging.  Ranie Chinchilla, DO   

## 2022-10-03 ENCOUNTER — Telehealth (INDEPENDENT_AMBULATORY_CARE_PROVIDER_SITE_OTHER): Payer: Medicare Other | Admitting: Family Medicine

## 2022-10-03 ENCOUNTER — Encounter: Payer: Self-pay | Admitting: Family Medicine

## 2022-10-03 VITALS — BP 134/75 | HR 74 | Ht 64.0 in | Wt 172.0 lb

## 2022-10-03 DIAGNOSIS — J40 Bronchitis, not specified as acute or chronic: Secondary | ICD-10-CM

## 2022-10-03 DIAGNOSIS — J329 Chronic sinusitis, unspecified: Secondary | ICD-10-CM

## 2022-10-03 MED ORDER — PREDNISONE 50 MG PO TABS: ORAL_TABLET | ORAL | 0 refills | Status: DC

## 2022-10-03 MED ORDER — DOXYCYCLINE HYCLATE 100 MG PO TABS: 100.0000 mg | ORAL_TABLET | Freq: Two times a day (BID) | ORAL | 0 refills | Status: DC

## 2022-10-03 NOTE — Progress Notes (Signed)
Diana Solis - 66 y.o. female MRN [id number]  Date of birth: Jan 30, 1956   This visit type was conducted due to national recommendations for restrictions regarding the COVID-19 Pandemic (e.g. social distancing).  This format is felt to be most appropriate for this patient at this time.  All issues noted in this document were discussed and addressed.  No physical exam was performed (except for noted visual exam findings with Video Visits).  I discussed the limitations of evaluation and management by telemedicine and the availability of in person appointments. The patient expressed understanding and agreed to proceed.  I connected withNAME@ on 10/03/22 at 11:30 AM EST by a video enabled telemedicine application and verified that I am speaking with the correct person using two identifiers.  Present at visit: Everrett Coombe, DO Vangie Bicker   Patient Location: [facility] [address] [place] [address]   Provider location:   [facility]  No chief complaint on file.   HPI  Diana Solis is a 66 y.o. female who presents via audio/video conferencing for a telehealth visit today.  She reports continued cough, congestion with wheezing and mild dyspnea and sinus pain.  She tested positive for COVID about a week and a half ago.  Prescribed Paxlovid and symptoms did seem to be improving however has had worsening over the past few days.  She has not had any new fever or chills.  She has taken Mucinex which helps some.   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   Hypothyroid    Urinary incontinence 10/19/2011    Past  Surgical History:  Procedure Laterality Date   BREAST CYST EXCISION     BREAST EXCISIONAL BIOPSY     INCONTINENCE SURGERY     TONSILLECTOMY     VAGINAL HYSTERECTOMY      Family History  Problem Relation Age of Onset   Asthma Mother    Thyroid disease Mother    Lung cancer Father        smoker   Lupus Father     Social History   Socioeconomic History   Marital status: Married    Spouse name: Darrel   Number of children: 1   Years of education: Not on file   Highest education level: Not on file  Occupational History   Not on file  Tobacco Use   Smoking status: Former    Packs/day: 0.50    Years: 40.00    Total pack years: 20.00    Types: Cigarettes   Smokeless tobacco: Current   Tobacco comments:    e-cig  Vaping Use   Vaping Use: Never used  Substance and Sexual Activity   Alcohol use: Yes    Alcohol/week: 6.0 standard drinks of alcohol    Types: 6 Standard drinks or equivalent per week   Drug use: No   Sexual activity: Not on file  Other Topics Concern   Not on file  Social History Narrative   No regular exercise. 3- 4 caffeine drinks per day. 3 grandkids.    Social Determinants of Health   Financial  Resource Strain: Not on file  Food Insecurity: Not on file  Transportation Needs: Not on file  Physical Activity: Not on file  Stress: Not on file  Social Connections: Not on file  Intimate Partner Violence: Not on file     Current Outpatient Medications:    albuterol (VENTOLIN HFA) 108 (90 Base) MCG/ACT inhaler, Inhale 2 puffs into the lungs every 6 (six) hours as needed for wheezing or shortness of breath., Disp: 8 g, Rfl: 3   atorvastatin (LIPITOR) 40 MG tablet, TAKE 1 TABLET(40 MG) BY MOUTH DAILY, pt needs an appointment, Disp: 90 tablet, Rfl: 0   bimatoprost (LUMIGAN) 0.03 % ophthalmic solution, 1 drop at bedtime., Disp: , Rfl:    doxycycline (VIBRA-TABS) 100 MG tablet, Take 1 tablet (100 mg total) by mouth 2 (two) times daily., Disp: 20 tablet, Rfl:  0   fluticasone furoate-vilanterol (BREO ELLIPTA) 100-25 MCG/ACT AEPB, Inhale 1 puff into the lungs daily., Disp: 1 each, Rfl: 11   levothyroxine (SYNTHROID) 100 MCG tablet, Take 1 tablet (100 mcg total) by mouth daily., Disp: 90 tablet, Rfl: 3   Multiple Vitamin (MULTIVITAMIN) tablet, Take 1 tablet by mouth daily., Disp: , Rfl:    pantoprazole (PROTONIX) 40 MG tablet, Take 1 tablet (40 mg total) by mouth daily., Disp: 30 tablet, Rfl: 3   polyethylene glycol powder (GLYCOLAX/MIRALAX) 17 GM/SCOOP powder, Take 17 g by mouth daily., Disp: 850 g, Rfl: 12   predniSONE (DELTASONE) 50 MG tablet, Take 50mg  daily x5 days., Disp: 5 tablet, Rfl: 0   valACYclovir (VALTREX) 1000 MG tablet, TAKE 1 TABLET(1000 MG) BY MOUTH DAILY, Disp: 90 tablet, Rfl: 1   vitamin C (ASCORBIC ACID) 500 MG tablet, Take 500 mg by mouth daily., Disp: , Rfl:   EXAM:  VITALS per patient if applicable: BP 134/75   Pulse 74   Ht 5\' 4"  (1.626 m)   Wt 172 lb (78 kg)   SpO2 94%   BMI 29.52 kg/m   GENERAL: alert, oriented, appears well and in no acute distress  HEENT: atraumatic, conjunttiva clear, no obvious abnormalities on inspection of external nose and ears  NECK: normal movements of the head and neck  LUNGS: on inspection no signs of respiratory distress, breathing rate appears normal, no obvious gross SOB, gasping or wheezing  CV: no obvious cyanosis  MS: moves all visible extremities without noticeable abnormality  PSYCH/NEURO: pleasant and cooperative, no obvious depression or anxiety, speech and thought processing grossly intact  ASSESSMENT AND PLAN:  Discussed the following assessment and plan:  Sinobronchitis Recent COVID infection with improvement now having worsening symptoms again including sinus pain and wheezing.  Adding course of doxycycline as well as a course of prednisone as she does have COPD.  Encouraged to stay well-hydrated.  Recommend follow-up in the clinic if she is having continued  symptoms.     I discussed the assessment and treatment plan with the patient. The patient was provided an opportunity to ask questions and all were answered. The patient agreed with the plan and demonstrated an understanding of the instructions.   The patient was advised to call back or seek an in-person evaluation if the symptoms worsen or if the condition fails to improve as anticipated.    , DO

## 2022-10-03 NOTE — Assessment & Plan Note (Signed)
Recent COVID infection with improvement now having worsening symptoms again including sinus pain and wheezing.  Adding course of doxycycline as well as a course of prednisone as she does have COPD.  Encouraged to stay well-hydrated.  Recommend follow-up in the clinic if she is having continued symptoms.

## 2022-10-03 NOTE — Progress Notes (Signed)
Sinus congestion

## 2022-10-11 ENCOUNTER — Other Ambulatory Visit: Payer: Self-pay | Admitting: Family Medicine

## 2022-10-11 DIAGNOSIS — H401132 Primary open-angle glaucoma, bilateral, moderate stage: Secondary | ICD-10-CM | POA: Diagnosis not present

## 2022-10-11 DIAGNOSIS — J439 Emphysema, unspecified: Secondary | ICD-10-CM

## 2022-10-12 DIAGNOSIS — Z87891 Personal history of nicotine dependence: Secondary | ICD-10-CM | POA: Diagnosis not present

## 2022-10-16 DIAGNOSIS — J449 Chronic obstructive pulmonary disease, unspecified: Secondary | ICD-10-CM | POA: Diagnosis not present

## 2022-11-12 ENCOUNTER — Other Ambulatory Visit: Payer: Self-pay | Admitting: Family Medicine

## 2022-11-23 DIAGNOSIS — J449 Chronic obstructive pulmonary disease, unspecified: Secondary | ICD-10-CM | POA: Diagnosis not present

## 2022-11-23 DIAGNOSIS — R918 Other nonspecific abnormal finding of lung field: Secondary | ICD-10-CM | POA: Diagnosis not present

## 2022-12-17 ENCOUNTER — Other Ambulatory Visit: Payer: Self-pay | Admitting: Family Medicine

## 2022-12-18 MED ORDER — ATORVASTATIN CALCIUM 40 MG PO TABS: ORAL_TABLET | ORAL | 2 refills | Status: DC

## 2023-01-23 DIAGNOSIS — J432 Centrilobular emphysema: Secondary | ICD-10-CM | POA: Diagnosis not present

## 2023-01-23 DIAGNOSIS — I2510 Atherosclerotic heart disease of native coronary artery without angina pectoris: Secondary | ICD-10-CM | POA: Diagnosis not present

## 2023-01-23 DIAGNOSIS — J984 Other disorders of lung: Secondary | ICD-10-CM | POA: Diagnosis not present

## 2023-01-23 DIAGNOSIS — R918 Other nonspecific abnormal finding of lung field: Secondary | ICD-10-CM | POA: Diagnosis not present

## 2023-01-24 DIAGNOSIS — H401132 Primary open-angle glaucoma, bilateral, moderate stage: Secondary | ICD-10-CM | POA: Diagnosis not present

## 2023-02-14 DIAGNOSIS — J449 Chronic obstructive pulmonary disease, unspecified: Secondary | ICD-10-CM | POA: Diagnosis not present

## 2023-02-14 DIAGNOSIS — R918 Other nonspecific abnormal finding of lung field: Secondary | ICD-10-CM | POA: Diagnosis not present

## 2023-03-07 DIAGNOSIS — H401132 Primary open-angle glaucoma, bilateral, moderate stage: Secondary | ICD-10-CM | POA: Diagnosis not present

## 2023-03-15 ENCOUNTER — Encounter: Payer: Self-pay | Admitting: Family Medicine

## 2023-03-15 DIAGNOSIS — B001 Herpesviral vesicular dermatitis: Secondary | ICD-10-CM

## 2023-03-15 MED ORDER — VALACYCLOVIR HCL 1 G PO TABS: ORAL_TABLET | ORAL | 1 refills | Status: DC

## 2023-04-09 ENCOUNTER — Other Ambulatory Visit: Payer: Self-pay | Admitting: Family Medicine

## 2023-04-23 DIAGNOSIS — K080 Exfoliation of teeth due to systemic causes: Secondary | ICD-10-CM | POA: Diagnosis not present

## 2023-06-11 DIAGNOSIS — J449 Chronic obstructive pulmonary disease, unspecified: Secondary | ICD-10-CM | POA: Diagnosis not present

## 2023-06-14 ENCOUNTER — Other Ambulatory Visit: Payer: Self-pay | Admitting: Family Medicine

## 2023-06-14 DIAGNOSIS — Z1231 Encounter for screening mammogram for malignant neoplasm of breast: Secondary | ICD-10-CM

## 2023-07-12 ENCOUNTER — Other Ambulatory Visit: Payer: Self-pay | Admitting: Family Medicine

## 2023-07-18 ENCOUNTER — Ambulatory Visit (INDEPENDENT_AMBULATORY_CARE_PROVIDER_SITE_OTHER): Payer: Medicare Other

## 2023-07-18 DIAGNOSIS — Z1231 Encounter for screening mammogram for malignant neoplasm of breast: Secondary | ICD-10-CM

## 2023-07-20 ENCOUNTER — Encounter: Payer: Self-pay | Admitting: Family Medicine

## 2023-07-20 ENCOUNTER — Ambulatory Visit (INDEPENDENT_AMBULATORY_CARE_PROVIDER_SITE_OTHER): Payer: Medicare Other | Admitting: Family Medicine

## 2023-07-20 VITALS — BP 145/74 | HR 76 | Ht 64.0 in | Wt 170.0 lb

## 2023-07-20 DIAGNOSIS — E039 Hypothyroidism, unspecified: Secondary | ICD-10-CM

## 2023-07-20 DIAGNOSIS — R7303 Prediabetes: Secondary | ICD-10-CM

## 2023-07-20 DIAGNOSIS — E782 Mixed hyperlipidemia: Secondary | ICD-10-CM | POA: Diagnosis not present

## 2023-07-20 DIAGNOSIS — D485 Neoplasm of uncertain behavior of skin: Secondary | ICD-10-CM

## 2023-07-20 DIAGNOSIS — J439 Emphysema, unspecified: Secondary | ICD-10-CM

## 2023-07-20 DIAGNOSIS — L309 Dermatitis, unspecified: Secondary | ICD-10-CM

## 2023-07-20 DIAGNOSIS — Z0000 Encounter for general adult medical examination without abnormal findings: Secondary | ICD-10-CM

## 2023-07-20 DIAGNOSIS — B001 Herpesviral vesicular dermatitis: Secondary | ICD-10-CM

## 2023-07-20 MED ORDER — ATORVASTATIN CALCIUM 40 MG PO TABS: ORAL_TABLET | ORAL | 2 refills | Status: DC

## 2023-07-20 MED ORDER — VALACYCLOVIR HCL 1 G PO TABS
ORAL_TABLET | ORAL | 1 refills | Status: DC
Start: 2023-07-20 — End: 2024-07-23

## 2023-07-20 MED ORDER — CLOBETASOL PROPIONATE 0.05 % EX CREA: 1.0000 | TOPICAL_CREAM | Freq: Two times a day (BID) | CUTANEOUS | 0 refills | Status: DC

## 2023-07-20 MED ORDER — SPIRIVA RESPIMAT 2.5 MCG/ACT IN AERS: INHALATION_SPRAY | RESPIRATORY_TRACT | 11 refills | Status: DC

## 2023-07-20 MED ORDER — FLUTICASONE FUROATE-VILANTEROL 100-25 MCG/ACT IN AEPB
1.0000 | INHALATION_SPRAY | Freq: Every day | RESPIRATORY_TRACT | 11 refills | Status: DC
Start: 2023-07-20 — End: 2024-07-23

## 2023-07-20 MED ORDER — PANTOPRAZOLE SODIUM 40 MG PO TBEC: DELAYED_RELEASE_TABLET | ORAL | 3 refills | Status: DC

## 2023-07-20 NOTE — Patient Instructions (Signed)
Preventive Care 65 Years and Older, Female Preventive care refers to lifestyle choices and visits with your health care provider that can promote health and wellness. Preventive care visits are also called wellness exams. What can I expect for my preventive care visit? Counseling Your health care provider may ask you questions about your: Medical history, including: Past medical problems. Family medical history. Pregnancy and menstrual history. History of falls. Current health, including: Memory and ability to understand (cognition). Emotional well-being. Home life and relationship well-being. Sexual activity and sexual health. Lifestyle, including: Alcohol, nicotine or tobacco, and drug use. Access to firearms. Diet, exercise, and sleep habits. Work and work environment. Sunscreen use. Safety issues such as seatbelt and bike helmet use. Physical exam Your health care provider will check your: Height and weight. These may be used to calculate your BMI (body mass index). BMI is a measurement that tells if you are at a healthy weight. Waist circumference. This measures the distance around your waistline. This measurement also tells if you are at a healthy weight and may help predict your risk of certain diseases, such as type 2 diabetes and high blood pressure. Heart rate and blood pressure. Body temperature. Skin for abnormal spots. What immunizations do I need?  Vaccines are usually given at various ages, according to a schedule. Your health care provider will recommend vaccines for you based on your age, medical history, and lifestyle or other factors, such as travel or where you work. What tests do I need? Screening Your health care provider may recommend screening tests for certain conditions. This may include: Lipid and cholesterol levels. Hepatitis C test. Hepatitis B test. HIV (human immunodeficiency virus) test. STI (sexually transmitted infection) testing, if you are at  risk. Lung cancer screening. Colorectal cancer screening. Diabetes screening. This is done by checking your blood sugar (glucose) after you have not eaten for a while (fasting). Mammogram. Talk with your health care provider about how often you should have regular mammograms. BRCA-related cancer screening. This may be done if you have a family history of breast, ovarian, tubal, or peritoneal cancers. Bone density scan. This is done to screen for osteoporosis. Talk with your health care provider about your test results, treatment options, and if necessary, the need for more tests. Follow these instructions at home: Eating and drinking  Eat a diet that includes fresh fruits and vegetables, whole grains, lean protein, and low-fat dairy products. Limit your intake of foods with high amounts of sugar, saturated fats, and salt. Take vitamin and mineral supplements as recommended by your health care provider. Do not drink alcohol if your health care provider tells you not to drink. If you drink alcohol: Limit how much you have to 0-1 drink a day. Know how much alcohol is in your drink. In the [place] one drink equals one 12 oz bottle of beer (355 mL), one 5 oz glass of wine (148 mL), or one 1 oz glass of hard liquor (44 mL). Lifestyle Brush your teeth every morning and night with fluoride toothpaste. Floss one time each day. Exercise for at least 30 minutes 5 or more days each week. Do not use any products that contain nicotine or tobacco. These products include cigarettes, chewing tobacco, and vaping devices, such as e-cigarettes. If you need help quitting, ask your health care provider. Do not use drugs. If you are sexually active, practice safe sex. Use a condom or other form of protection in order to prevent STIs. Take aspirin only as told by   your health care provider. Make sure that you understand how much to take and what form to take. Work with your health care provider to find out whether it  is safe and beneficial for you to take aspirin daily. Ask your health care provider if you need to take a cholesterol-lowering medicine (statin). Find healthy ways to manage stress, such as: Meditation, yoga, or listening to music. Journaling. Talking to a trusted person. Spending time with friends and family. Minimize exposure to UV radiation to reduce your risk of skin cancer. Safety Always wear your seat belt while driving or riding in a vehicle. Do not drive: If you have been drinking alcohol. Do not ride with someone who has been drinking. When you are tired or distracted. While texting. If you have been using any mind-altering substances or drugs. Wear a helmet and other protective equipment during sports activities. If you have firearms in your house, make sure you follow all gun safety procedures. What's next? Visit your health care provider once a year for an annual wellness visit. Ask your health care provider how often you should have your eyes and teeth checked. Stay up to date on all vaccines. This information is not intended to replace advice given to you by your health care provider. Make sure you discuss any questions you have with your health care provider. Document Revised: 04/27/2021 Document Reviewed: 04/27/2021 Elsevier Patient Education  2024 Elsevier Inc.  

## 2023-07-20 NOTE — Progress Notes (Unsigned)
Diana Solis - 67 y.o. female MRN [id number]  Date of birth: 04/05/1956  Subjective No chief complaint on file.   HPI Diana Solis is a 67 y.o. female here today for ***.   Skin issue No Known Allergies  Past Medical History:  Diagnosis Date  . COPD (chronic obstructive pulmonary disease) (HCC)   . DIVERTICULOSIS, COLON 12/10/2010   Qualifier: Diagnosis of  By: Orson Aloe MD, Tinnie Gens    . GERD (gastroesophageal reflux disease)   . GLAUCOMA 09/29/2009   Qualifier: Diagnosis of  By: Linford Arnold MD, Santina Evans    . 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 Hypothyroid   . Urinary incontinence 10/19/2011    Past Surgical History:  Procedure Laterality Date  . BREAST CYST EXCISION    . BREAST EXCISIONAL BIOPSY    . INCONTINENCE SURGERY    . TONSILLECTOMY    . VAGINAL HYSTERECTOMY      Social History   Socioeconomic History  . Marital status: Married    Spouse name: Darrel  . Number of children: 1  . Years of education: Not on file  . Highest education level: Not on file  Occupational History  . Not on file  Tobacco Use  . Smoking status: Former    Current packs/day: 0.50    Average packs/day: 0.5 packs/day for 40.0 years (20.0 ttl pk-yrs)    Types: Cigarettes  . Smokeless tobacco: Current  . Tobacco comments:    e-cig  Vaping Use  . Vaping status: Never Used  Substance and Sexual Activity  . Alcohol use: Yes    Alcohol/week: 6.0 standard drinks of alcohol    Types: 6 Standard drinks or equivalent per week  . Drug use: No  . Sexual activity: Not on file  Other Topics Concern  . Not on file  Social History Narrative   No regular exercise. 3- 4 caffeine drinks per day. 3 grandkids.    Social Determinants of Health   Financial Resource Strain: Not on file  Food Insecurity: Not on file  Transportation Needs: Not on file  Physical Activity: Not on file  Stress: Not on file  Social Connections: Unknown (07/26/2022)   [facility]    Social Network   . Social Network: Not on file    Family History  Problem Relation Age of Onset  . Asthma Mother   . Thyroid disease Mother   . Lung cancer Father        smoker  . Lupus Father     Health Maintenance  Topic Date Due  . Medicare Annual Wellness (AWV)  07/13/2022  . INFLUENZA VACCINE  06/14/2023  . COVID-19 Vaccine (7 - 2023-24 season) 07/15/2023  . Lung Cancer Screening  10/04/2023 (Originally 03/11/2006)  . MAMMOGRAM  07/17/2025  . Colonoscopy  03/29/2027  . DTaP/Tdap/Td (3 - Td or Tdap) 08/01/2032  . Pneumonia Vaccine 10+ Years old  Completed  . DEXA SCAN  Completed  . Hepatitis C Screening  Completed  . Zoster Vaccines- Shingrix  Completed  . HPV VACCINES  Aged Out     ----------------------------------------------------------------------------------------------------------------------------------------------------------------------------------------------------------------- Physical Exam There were no vitals taken for this visit.  Physical Exam  ------------------------------------------------------------------------------------------------------------------------------------------------------------------------------------------------------------------- Assessment and Plan  No problem-specific Assessment & Plan notes found for this encounter.   No orders of the defined types were placed in this encounter.   No follow-ups on file.    This visit occurred during the SARS-CoV-2 public health emergency.  Safety protocols were in place, including screening  questions prior to the visit, additional usage of staff PPE, and extensive cleaning of exam room while observing appropriate contact time as indicated for disinfecting solutions.

## 2023-07-21 LAB — TSH: TSH: 0.872 u[IU]/mL (ref 0.450–4.500)

## 2023-07-21 LAB — CMP14+EGFR
ALT: 22 IU/L (ref 0–32)
AST: 21 IU/L (ref 0–40)
Albumin: 4.7 g/dL (ref 3.9–4.9)
Alkaline Phosphatase: 117 IU/L (ref 44–121)
BUN/Creatinine Ratio: 14 (ref 12–28)
BUN: 9 mg/dL (ref 8–27)
Bilirubin Total: 0.4 mg/dL (ref 0.0–1.2)
CO2: 25 mmol/L (ref 20–29)
Calcium: 10.1 mg/dL (ref 8.7–10.3)
Chloride: 99 mmol/L (ref 96–106)
Creatinine, Ser: 0.65 mg/dL (ref 0.57–1.00)
Globulin, Total: 2.3 g/dL (ref 1.5–4.5)
Glucose: 96 mg/dL (ref 70–99)
Potassium: 4.4 mmol/L (ref 3.5–5.2)
Sodium: 138 mmol/L (ref 134–144)
Total Protein: 7 g/dL (ref 6.0–8.5)
eGFR: 96 mL/min/{1.73_m2} (ref >59)

## 2023-07-21 LAB — LIPID PANEL WITH LDL/HDL RATIO
Cholesterol, Total: 170 mg/dL (ref 100–199)
HDL: 65 mg/dL (ref >39)
LDL Chol Calc (NIH): 82 mg/dL (ref 0–99)
LDL/HDL Ratio: 1.3 ratio (ref 0.0–3.2)
Triglycerides: 133 mg/dL (ref 0–149)
VLDL Cholesterol Cal: 23 mg/dL (ref 5–40)

## 2023-07-21 LAB — CBC WITH DIFFERENTIAL/PLATELET
Basophils Absolute: 0 10*3/uL (ref 0.0–0.2)
Basos: 1 %
EOS (ABSOLUTE): 0.1 10*3/uL (ref 0.0–0.4)
Eos: 2 %
Hematocrit: 42.6 % (ref 34.0–46.6)
Hemoglobin: 14.4 g/dL (ref 11.1–15.9)
Immature Grans (Abs): 0 10*3/uL (ref 0.0–0.1)
Immature Granulocytes: 0 %
Lymphocytes Absolute: 1.4 10*3/uL (ref 0.7–3.1)
Lymphs: 29 %
MCH: 30.6 pg (ref 26.6–33.0)
MCHC: 33.8 g/dL (ref 31.5–35.7)
MCV: 90 fL (ref 79–97)
Monocytes Absolute: 0.5 10*3/uL (ref 0.1–0.9)
Monocytes: 10 %
Neutrophils Absolute: 2.8 10*3/uL (ref 1.4–7.0)
Neutrophils: 58 %
Platelets: 247 10*3/uL (ref 150–450)
RBC: 4.71 x10E6/uL (ref 3.77–5.28)
RDW: 12.8 % (ref 11.7–15.4)
WBC: 4.7 10*3/uL (ref 3.4–10.8)

## 2023-07-21 LAB — HEMOGLOBIN A1C
Est. average glucose Bld gHb Est-mCnc: 128 mg/dL
Hgb A1c MFr Bld: 6.1 % — ABNORMAL HIGH (ref 4.8–5.6)

## 2023-07-22 DIAGNOSIS — Z0000 Encounter for general adult medical examination without abnormal findings: Secondary | ICD-10-CM | POA: Insufficient documentation

## 2023-07-22 DIAGNOSIS — L309 Dermatitis, unspecified: Secondary | ICD-10-CM | POA: Insufficient documentation

## 2023-07-22 NOTE — Assessment & Plan Note (Signed)
New referral to [facility] chest ordered for COPD and pulmonary nodule follow-up.

## 2023-07-22 NOTE — Assessment & Plan Note (Signed)
Well adult Orders Placed This Encounter  Procedures   CBC with Differential/Platelet   CMP14+EGFR   Lipid Panel With LDL/HDL Ratio   TSH   HgB W2N   Ambulatory referral to Pulmonology    Referral Priority:   Routine    Referral Type:   Consultation    Referral Reason:   Specialty Services Required    Requested Specialty:   Pulmonary Disease    Number of Visits Requested:   1   Ambulatory referral to Dermatology    Referral Priority:   Routine    Referral Type:   Consultation    Referral Reason:   Specialty Services Required    Requested Specialty:   Dermatology    Number of Visits Requested:   1  Screenings: Per lab orders Immunizations: She will have these completed at her pharmacy Anticipatory guidance/risk factor reduction: Recommendations per AVS

## 2023-07-22 NOTE — Assessment & Plan Note (Signed)
She will try clobetasol on the elbows as well as on lesion on the ear.  Referral placed to dermatology for continued care as well as evaluation of lesion on the left jawline.

## 2023-07-25 ENCOUNTER — Telehealth: Payer: Self-pay | Admitting: Family Medicine

## 2023-07-25 NOTE — Telephone Encounter (Signed)
Received incoming voicemail from [facility] Chest Specialist requesting Xray report on patient to be faxed to [phone] to be reviewed with referral.   Requested documentation and CT report from [facility] has been faxed to [facility] Chest Specialist at [phone].

## 2023-07-26 ENCOUNTER — Other Ambulatory Visit: Payer: Self-pay | Admitting: Family Medicine

## 2023-09-05 DIAGNOSIS — J449 Chronic obstructive pulmonary disease, unspecified: Secondary | ICD-10-CM | POA: Diagnosis not present

## 2023-09-05 DIAGNOSIS — R918 Other nonspecific abnormal finding of lung field: Secondary | ICD-10-CM | POA: Diagnosis not present

## 2023-09-10 ENCOUNTER — Encounter: Payer: Self-pay | Admitting: Family Medicine

## 2023-09-12 DIAGNOSIS — H401132 Primary open-angle glaucoma, bilateral, moderate stage: Secondary | ICD-10-CM | POA: Diagnosis not present

## 2023-10-24 DIAGNOSIS — H401132 Primary open-angle glaucoma, bilateral, moderate stage: Secondary | ICD-10-CM | POA: Diagnosis not present

## 2023-10-25 DIAGNOSIS — K080 Exfoliation of teeth due to systemic causes: Secondary | ICD-10-CM | POA: Diagnosis not present

## 2023-11-22 ENCOUNTER — Encounter: Payer: Self-pay | Admitting: Family Medicine

## 2023-12-27 ENCOUNTER — Telehealth: Payer: Medicare Other | Admitting: Physician Assistant

## 2023-12-27 DIAGNOSIS — R3989 Other symptoms and signs involving the genitourinary system: Secondary | ICD-10-CM | POA: Diagnosis not present

## 2023-12-27 MED ORDER — CEPHALEXIN 500 MG PO CAPS: 500.0000 mg | ORAL_CAPSULE | Freq: Two times a day (BID) | ORAL | 0 refills | Status: AC

## 2023-12-27 NOTE — Progress Notes (Signed)
 Virtual Visit Consent   Diana Solis, you are scheduled for a virtual visit with a [facility] provider today. Just as with appointments in the office, your consent must be obtained to participate. Your consent will be active for this visit and any virtual visit you may have with one of our providers in the next 365 days. If you have a MyChart account, a copy of this consent can be sent to you electronically.  As this is a virtual visit, video technology does not allow for your provider to perform a traditional examination. This may limit your provider's ability to fully assess your condition. If your provider identifies any concerns that need to be evaluated in person or the need to arrange testing (such as labs, EKG, etc.), we will make arrangements to do so. Although advances in technology are sophisticated, we cannot ensure that it will always work on either your end or our end. If the connection with a video visit is poor, the visit may have to be switched to a telephone visit. With either a video or telephone visit, we are not always able to ensure that we have a secure connection.  By engaging in this virtual visit, you consent to the provision of healthcare and authorize for your insurance to be billed (if applicable) for the services provided during this visit. Depending on your insurance coverage, you may receive a charge related to this service.  I need to obtain your verbal consent now. Are you willing to proceed with your visit today? Diana Solis has provided verbal consent on 12/27/2023 for a virtual visit (video or telephone). Piedad Climes, [place]  Date: 12/27/2023 2:22 PM   Virtual Visit via Video Note   I, Piedad Climes, connected with  Diana Solis  ([id number], January 12, 1956) on 12/27/23 at  2:30 PM EST by a video-enabled telemedicine application and verified that I am speaking with the correct person using two identifiers.  Location: Patient: Virtual Visit Location  Patient: Home Provider: Virtual Visit Location Provider: Home Office   I discussed the limitations of evaluation and management by telemedicine and the availability of in person appointments. The patient expressed understanding and agreed to proceed.    History of Present Illness: Diana Solis is a 68 y.o. who identifies as a female who was assigned female at birth, and is being seen today for possible UTI.  Patient endorses symptoms starting about a week ago with some mild urinary frequency, urgency. Has been gradually building since that time with new onset of dysuria, suprapubic pressure. Denies chills, nausea or vomiting. Denies flank pain. Took two at-home UTI tests this morning which came back positive.    HPI: HPI  Problems:  Patient Active Problem List   Diagnosis Date Noted   Well adult exam 07/22/2023   Eczema 07/22/2023   Sinobronchitis 10/03/2022   HSV infection 10/12/2020   Dysphasia 04/30/2018   HLD (hyperlipidemia) 01/08/2018   Fatigue 12/18/2017   MDD (major depressive disorder) 12/18/2017   Arteriosclerosis of abdominal aorta (HCC) 05/07/2017   Constipation 03/22/2017   Ventral hernia 08/28/2016   Prediabetes 07/27/2016   Nonspecific abnormal electrocardiogram (ECG) (EKG) 07/24/2016   OSA (obstructive sleep apnea) 09/01/2015   Hypothyroidism 06/01/2015   Hypersomnia 03/09/2015   Chronic obstructive pulmonary disease (HCC) 03/23/2014   Spastic colon 10/19/2011   Urinary incontinence 10/19/2011   Diverticulosis of colon 12/10/2010   Unspecified glaucoma 09/29/2009    Allergies: No Known Allergies Medications:  Current Outpatient Medications:    cephALEXin (  KEFLEX) 500 MG capsule, Take 1 capsule (500 mg total) by mouth 2 (two) times daily for 7 days., Disp: 14 capsule, Rfl: 0   albuterol (VENTOLIN HFA) 108 (90 Base) MCG/ACT inhaler, Inhale 2 puffs into the lungs every 6 (six) hours as needed for wheezing or shortness of breath., Disp: 8 g, Rfl: 3   atorvastatin  (LIPITOR) 40 MG tablet, TAKE 1 TABLET(40 MG) BY MOUTH DAILY., Disp: 90 tablet, Rfl: 2   bimatoprost (LUMIGAN) 0.03 % ophthalmic solution, 1 drop at bedtime., Disp: , Rfl:    clobetasol cream (TEMOVATE) 0.05 %, Apply 1 Application topically 2 (two) times daily., Disp: 60 g, Rfl: 0   fluticasone furoate-vilanterol (BREO ELLIPTA) 100-25 MCG/ACT AEPB, Inhale 1 puff into the lungs daily., Disp: 1 each, Rfl: 11   levothyroxine (SYNTHROID) 100 MCG tablet, TAKE 1 TABLET(100 MCG) BY MOUTH DAILY, Disp: 90 tablet, Rfl: 3   Multiple Vitamin (MULTIVITAMIN) tablet, Take 1 tablet by mouth daily., Disp: , Rfl:    pantoprazole (PROTONIX) 40 MG tablet, TAKE 1 TABLET(40 MG) BY MOUTH DAILY, Disp: 30 tablet, Rfl: 3   SPIRIVA RESPIMAT 2.5 MCG/ACT AERS, SMARTSIG:2 Puff(s) Via Inhaler Daily, Disp: 4 g, Rfl: 11   valACYclovir (VALTREX) 1000 MG tablet, TAKE 1 TABLET(1000 MG) BY MOUTH DAILY, Disp: 90 tablet, Rfl: 1   vitamin C (ASCORBIC ACID) 500 MG tablet, Take 500 mg by mouth daily., Disp: , Rfl:   Observations/Objective: Patient is well-developed, well-nourished in no acute distress.  Resting comfortably at home.  Head is normocephalic, atraumatic.  No labored breathing. Speech is clear and coherent with logical content.  Patient is alert and oriented at baseline.   Assessment and Plan: 1. Suspected UTI (Primary) - cephALEXin (KEFLEX) 500 MG capsule; Take 1 capsule (500 mg total) by mouth 2 (two) times daily for 7 days.  Dispense: 14 capsule; Refill: 0  Classic UTI symptoms with absence of alarm signs or symptoms. Prior history of UTI.  Discussed standard of care with urine culture in those over 65. However, giving out of town, appt availability, risk of flu/COVID and no alarm signs/symptoms, will agree to start treatment. Will treat empirically with Keflex for suspected uncomplicated cystitis. Supportive measures and OTC medications reviewed. Strict in-person evaluation precautions discussed.    Follow Up  Instructions: I discussed the assessment and treatment plan with the patient. The patient was provided an opportunity to ask questions and all were answered. The patient agreed with the plan and demonstrated an understanding of the instructions.  A copy of instructions were sent to the patient via MyChart unless otherwise noted below.   The patient was advised to call back or seek an in-person evaluation if the symptoms worsen or if the condition fails to improve as anticipated.    Piedad Climes, PA-C

## 2023-12-27 NOTE — Patient Instructions (Signed)
Diana Solis, thank you for joining Piedad Climes, PA-C for today's virtual visit.  While this provider is not your primary care provider (PCP), if your PCP is located in our provider database this encounter information will be shared with them immediately following your visit.   A [facility] MyChart account gives you access to today's visit and all your visits, tests, and labs performed at [facility] " click here if you don't have a [facility] MyChart account or go to [email or web address]  Consent: (Patient) Diana Solis provided verbal consent for this virtual visit at the beginning of the encounter.  Current Medications:  Current Outpatient Medications:    cephALEXin (KEFLEX) 500 MG capsule, Take 1 capsule (500 mg total) by mouth 2 (two) times daily for 7 days., Disp: 14 capsule, Rfl: 0   albuterol (VENTOLIN HFA) 108 (90 Base) MCG/ACT inhaler, Inhale 2 puffs into the lungs every 6 (six) hours as needed for wheezing or shortness of breath., Disp: 8 g, Rfl: 3   atorvastatin (LIPITOR) 40 MG tablet, TAKE 1 TABLET(40 MG) BY MOUTH DAILY., Disp: 90 tablet, Rfl: 2   bimatoprost (LUMIGAN) 0.03 % ophthalmic solution, 1 drop at bedtime., Disp: , Rfl:    clobetasol cream (TEMOVATE) 0.05 %, Apply 1 Application topically 2 (two) times daily., Disp: 60 g, Rfl: 0   fluticasone furoate-vilanterol (BREO ELLIPTA) 100-25 MCG/ACT AEPB, Inhale 1 puff into the lungs daily., Disp: 1 each, Rfl: 11   levothyroxine (SYNTHROID) 100 MCG tablet, TAKE 1 TABLET(100 MCG) BY MOUTH DAILY, Disp: 90 tablet, Rfl: 3   Multiple Vitamin (MULTIVITAMIN) tablet, Take 1 tablet by mouth daily., Disp: , Rfl:    pantoprazole (PROTONIX) 40 MG tablet, TAKE 1 TABLET(40 MG) BY MOUTH DAILY, Disp: 30 tablet, Rfl: 3   SPIRIVA RESPIMAT 2.5 MCG/ACT AERS, SMARTSIG:2 Puff(s) Via Inhaler Daily, Disp: 4 g, Rfl: 11   valACYclovir (VALTREX) 1000 MG tablet, TAKE 1 TABLET(1000 MG) BY MOUTH DAILY, Disp: 90 tablet, Rfl: 1    vitamin C (ASCORBIC ACID) 500 MG tablet, Take 500 mg by mouth daily., Disp: , Rfl:    Medications ordered in this encounter:  Meds ordered this encounter  Medications   cephALEXin (KEFLEX) 500 MG capsule    Sig: Take 1 capsule (500 mg total) by mouth 2 (two) times daily for 7 days.    Dispense:  14 capsule    Refill:  0    Supervising Provider:   Merrilee Jansky [[id number]]     *If you need refills on other medications prior to your next appointment, please contact your pharmacy*  Follow-Up: Call back or seek an in-person evaluation if the symptoms worsen or if the condition fails to improve as anticipated.   Virtual Care [phone]  Other Instructions Your symptoms are consistent with a bladder infection, also called acute cystitis. Please take your antibiotic (Keflex) as directed until all pills are gone.  Stay very well hydrated.  Consider a daily probiotic (Align, Culturelle, or Activia) to help prevent stomach upset caused by the antibiotic.  Taking a probiotic daily may also help prevent recurrent UTIs.  Also consider taking AZO (Phenazopyridine) tablets to help decrease pain with urination.     Urinary Tract Infection A urinary tract infection (UTI) can occur any place along the urinary tract. The tract includes the kidneys, ureters, bladder, and urethra. A type of germ called bacteria often causes a UTI. UTIs are often helped with antibiotic medicine.  HOME CARE  If given, take antibiotics as  told by your doctor. Finish them even if you start to feel better. Drink enough fluids to keep your pee (urine) clear or pale yellow. Avoid tea, drinks with caffeine, and bubbly (carbonated) drinks. Pee often. Avoid holding your pee in for a long time. Pee before and after having sex (intercourse). Wipe from front to back after you poop (bowel movement) if you are a woman. Use each tissue only once. GET HELP RIGHT AWAY IF:  You have back pain. You have lower belly  (abdominal) pain. You have chills. You feel sick to your stomach (nauseous). You throw up (vomit). Your burning or discomfort with peeing does not go away. You have a fever. Your symptoms are not better in 3 days. MAKE SURE YOU:  Understand these instructions. Will watch your condition. Will get help right away if you are not doing well or get worse. Document Released: 04/17/2008 Document Revised: 07/24/2012 Document Reviewed: 05/30/2012 [facility] Patient Information 2015 [place], [place]. This information is not intended to replace advice given to you by your health care provider. Make sure you discuss any questions you have with your health care provider.    If you have been instructed to have an in-person evaluation today at a local Urgent Care facility, please use the link below. It will take you to a list of all of our available [facility], including address, phone number and hours of operation. Please do not delay care.  [facility]  If you or a family member do not have a primary care provider, use the link below to schedule a visit and establish care. When you choose a [facility] primary care physician or advanced practice provider, you gain a long-term partner in health. Find a Primary Care Provider  Learn more about [facility]'s in-office and virtual care options: [facility] - Get Care Now

## 2024-01-14 ENCOUNTER — Other Ambulatory Visit: Payer: Self-pay | Admitting: Family Medicine

## 2024-01-14 DIAGNOSIS — R300 Dysuria: Secondary | ICD-10-CM

## 2024-01-15 DIAGNOSIS — R300 Dysuria: Secondary | ICD-10-CM | POA: Diagnosis not present

## 2024-01-16 ENCOUNTER — Other Ambulatory Visit: Payer: Self-pay

## 2024-01-16 LAB — MICROSCOPIC EXAMINATION
Bacteria, UA: NONE SEEN
Casts: NONE SEEN /LPF
Epithelial Cells (non renal): NONE SEEN /HPF (ref 0–10)
RBC, Urine: NONE SEEN /HPF (ref 0–2)

## 2024-01-16 LAB — URINALYSIS, ROUTINE W REFLEX MICROSCOPIC
Bilirubin, UA: NEGATIVE
Glucose, UA: NEGATIVE
Ketones, UA: NEGATIVE
Nitrite, UA: NEGATIVE
RBC, UA: NEGATIVE
Specific Gravity, UA: 1.028 (ref 1.005–1.030)
Urobilinogen, Ur: 1 mg/dL (ref 0.2–1.0)
pH, UA: 6.5 (ref 5.0–7.5)

## 2024-01-16 MED ORDER — PANTOPRAZOLE SODIUM 40 MG PO TBEC: DELAYED_RELEASE_TABLET | ORAL | 3 refills | Status: DC

## 2024-01-17 ENCOUNTER — Encounter: Payer: Self-pay | Admitting: Family Medicine

## 2024-01-22 ENCOUNTER — Telehealth: Payer: Self-pay

## 2024-01-22 NOTE — Telephone Encounter (Signed)
 Copied from CRM [id number]. Topic: Clinical - Lab/Test Results >> Jan 21, 2024  1:25 PM Abundio Miu S wrote: Reason for CRM: Requesting status of lab results for UTI. Callback #[phone]

## 2024-01-23 ENCOUNTER — Other Ambulatory Visit (INDEPENDENT_AMBULATORY_CARE_PROVIDER_SITE_OTHER)

## 2024-01-23 ENCOUNTER — Ambulatory Visit

## 2024-01-23 DIAGNOSIS — R300 Dysuria: Secondary | ICD-10-CM | POA: Diagnosis not present

## 2024-01-23 LAB — POCT URINALYSIS DIP (CLINITEK)
Bilirubin, UA: NEGATIVE
Blood, UA: NEGATIVE
Glucose, UA: NEGATIVE mg/dL
Ketones, POC UA: NEGATIVE mg/dL
Nitrite, UA: NEGATIVE
POC PROTEIN,UA: NEGATIVE
Spec Grav, UA: 1.01 (ref 1.010–1.025)
Urobilinogen, UA: 0.2 U/dL
pH, UA: 7 (ref 5.0–8.0)

## 2024-01-23 MED ORDER — CIPROFLOXACIN HCL 500 MG PO TABS: 500.0000 mg | ORAL_TABLET | Freq: Two times a day (BID) | ORAL | 0 refills | Status: DC

## 2024-01-23 MED ORDER — PHENAZOPYRIDINE HCL 100 MG PO TABS: 100.0000 mg | ORAL_TABLET | Freq: Three times a day (TID) | ORAL | 0 refills | Status: DC | PRN

## 2024-01-23 NOTE — Progress Notes (Signed)
 Pt states pelvic pressure, lethargy, kidney pain. Completed urinalysis. [place] to send meds to pharmacy. Urine sent for culture.

## 2024-01-23 NOTE — Telephone Encounter (Signed)
 [facility] is following up on urine culture.

## 2024-01-23 NOTE — Progress Notes (Signed)
 Treating empirically for complicated UTI given systemic symptoms.  Start Cipro 500 mg twice daily for 5 days.  Adding Pyridium 100 mg 3 times daily as needed for dysuria.  We will have more information once culture results are available.  ___________________________________________ Thayer Ohm, DNP, APRN, FNP-BC Primary Care and Sports Medicine [facility]

## 2024-01-23 NOTE — Telephone Encounter (Signed)
 [facility] did not run culture and tossed the sample. Contacted the patient. She's still experiencing pelvic pain, kidney pain, chills, and lethargy.   Spoke to covering provider [place]. Pt coming for urine culture sample at Nurse Visit.

## 2024-01-23 NOTE — Addendum Note (Signed)
 Addended byChristen Butter on: 01/23/2024 02:05 PM   Modules accepted: Orders

## 2024-01-25 ENCOUNTER — Encounter: Payer: Self-pay | Admitting: Medical-Surgical

## 2024-01-25 ENCOUNTER — Telehealth: Payer: Self-pay

## 2024-01-25 LAB — URINE CULTURE

## 2024-01-25 NOTE — Telephone Encounter (Signed)
 Forwarding message to Christen Butter, covering Dr. Ashley Royalty.   Spoke with patient. She is wanting to know if she should continue the abx (cipro) or stop it now that culture does not show UTI? She states she has taken a couple day of abx and is still having burning with urinating and still has pain at kidney are a occasionally

## 2024-01-25 NOTE — Telephone Encounter (Signed)
 Copied from CRM [id number]. Topic: Clinical - Lab/Test Results >> Jan 25, 2024  1:27 PM Diana Solis wrote: Reason for CRM: Wants to speak to someone in regard to her medication she is on bcause she states her lab results did not show she needed to be on them medication. Please call to advise patient on how to proceed

## 2024-01-28 NOTE — Telephone Encounter (Signed)
 Patient informed and schld for 02/04/24

## 2024-01-30 DIAGNOSIS — H401132 Primary open-angle glaucoma, bilateral, moderate stage: Secondary | ICD-10-CM | POA: Diagnosis not present

## 2024-02-04 DIAGNOSIS — R918 Other nonspecific abnormal finding of lung field: Secondary | ICD-10-CM | POA: Diagnosis not present

## 2024-02-06 ENCOUNTER — Encounter: Payer: Self-pay | Admitting: Family Medicine

## 2024-02-06 ENCOUNTER — Ambulatory Visit (INDEPENDENT_AMBULATORY_CARE_PROVIDER_SITE_OTHER)

## 2024-02-06 ENCOUNTER — Ambulatory Visit (INDEPENDENT_AMBULATORY_CARE_PROVIDER_SITE_OTHER): Admitting: Family Medicine

## 2024-02-06 VITALS — BP 134/78 | HR 76 | Ht 64.0 in | Wt 169.0 lb

## 2024-02-06 DIAGNOSIS — R8281 Pyuria: Secondary | ICD-10-CM | POA: Insufficient documentation

## 2024-02-06 DIAGNOSIS — R5383 Other fatigue: Secondary | ICD-10-CM

## 2024-02-06 DIAGNOSIS — M2550 Pain in unspecified joint: Secondary | ICD-10-CM | POA: Insufficient documentation

## 2024-02-06 DIAGNOSIS — R109 Unspecified abdominal pain: Secondary | ICD-10-CM | POA: Diagnosis not present

## 2024-02-06 NOTE — Progress Notes (Signed)
 Diana Solis - 68 y.o. female MRN [id number]  Date of birth: August 27, 1956  Subjective Chief Complaint  Patient presents with   Results    HPI Diana Solis is a 68 year old female here today for follow-up visit.  Concerned about leukocytes in urine on previous UA.  Her culture was negative.  She does have some flank pain that is intermittent on the right side.  Additionally, she has had some urinary frequency.  She denies fever or chills.  She is also concerned about lupus.  Her father had lupus and she is experiencing some of the symptoms that he had including increased joint stiffness and fatigue.  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   Hypothyroid    Urinary incontinence 10/19/2011    Past Surgical History:  Procedure Laterality Date   BREAST CYST EXCISION     BREAST EXCISIONAL BIOPSY     INCONTINENCE SURGERY     TONSILLECTOMY     VAGINAL HYSTERECTOMY      Social History   Socioeconomic History   Marital status: Married    Spouse name: Darrel   Number of children: 1   Years of education: Not on file   Highest education level: Not on file  Occupational History   Not on file  Tobacco Use   Smoking status: Former    Current packs/day: 0.50    Average packs/day: 0.5 packs/day for 40.0 years (20.0 ttl pk-yrs)    Types: Cigarettes   Smokeless tobacco: Current   Tobacco comments:    e-cig  Vaping Use   Vaping status: Never Used  Substance and Sexual Activity   Alcohol use: Yes    Alcohol/week: 6.0 standard drinks of alcohol    Types: 6 Standard drinks or equivalent  per week   Drug use: No   Sexual activity: Not on file  Other Topics Concern   Not on file  Social History Narrative   No regular exercise. 3- 4 caffeine drinks per day. 3 grandkids.    Social Drivers of Corporate investment banker Strain: Not on file  Food Insecurity: Not on file  Transportation Needs: Not on file  Physical Activity: Not on file  Stress: Not on file  Social Connections: Unknown (07/26/2022)   [facility]   Social Network    Social Network: Not on file    Family History  Problem Relation Age of Onset   Asthma Mother    Thyroid disease Mother    Lung cancer Father        smoker   Lupus Father     Health Maintenance  Topic Date Due   Lung Cancer Screening  Never done   Medicare Annual Wellness (AWV)  07/13/2022   COVID-19 Vaccine (8 - Pfizer risk 2024-25 season) 01/30/2024   MAMMOGRAM  07/17/2025   Colonoscopy  03/29/2027   DTaP/Tdap/Td (3 - Td or Tdap) 08/01/2032   Pneumonia Vaccine 35+ Years old  Completed   INFLUENZA VACCINE  Completed   DEXA SCAN  Completed   Hepatitis C Screening  Completed   Zoster Vaccines- Shingrix  Completed   HPV VACCINES  Aged Out     ----------------------------------------------------------------------------------------------------------------------------------------------------------------------------------------------------------------- Physical Exam BP 134/78   Pulse 76   Ht 5\' 4"  (1.626 m)   Wt 169 lb (76.7 kg)   SpO2 96%   BMI 29.01 kg/m   Physical Exam Constitutional:      Appearance: Normal appearance.  HENT:     Head: Normocephalic and atraumatic.  Cardiovascular:     Rate and Rhythm: Normal rate and regular rhythm.  Pulmonary:     Effort: Pulmonary effort is normal.     Breath sounds: Normal breath sounds.  Abdominal:     Tenderness: There is no right CVA tenderness or left CVA tenderness.  Neurological:     Mental Status: She is alert.  Psychiatric:        Mood  and Affect: Mood normal.        Behavior: Behavior normal.     ------------------------------------------------------------------------------------------------------------------------------------------------------------------------------------------------------------------- Assessment and Plan  Pyuria Rechecking urinalysis and culture.  She does have some flank pain as well so I ordered a KUB to evaluate for stone.  Polyarthralgia Family history of SLE.  She is having polyarthralgia.  No appreciable joint effusion noted today.  Will check ANA.   No orders of the defined types were placed in this encounter.   No follow-ups on file.    This visit occurred during the SARS-CoV-2 public health emergency.  Safety protocols were in place, including screening questions prior to the visit, additional usage of staff PPE, and extensive cleaning of exam room while observing appropriate contact time as indicated for disinfecting solutions.

## 2024-02-06 NOTE — Assessment & Plan Note (Signed)
 Rechecking urinalysis and culture.  She does have some flank pain as well so I ordered a KUB to evaluate for stone.

## 2024-02-06 NOTE — Assessment & Plan Note (Signed)
 Family history of SLE.  She is having polyarthralgia.  No appreciable joint effusion noted today.  Will check ANA.

## 2024-02-07 ENCOUNTER — Encounter: Payer: Self-pay | Admitting: Family Medicine

## 2024-02-08 LAB — URINE CULTURE: Organism ID, Bacteria: NO GROWTH

## 2024-02-09 LAB — URINALYSIS, ROUTINE W REFLEX MICROSCOPIC
Bilirubin, UA: NEGATIVE
Glucose, UA: NEGATIVE
Ketones, UA: NEGATIVE
Nitrite, UA: NEGATIVE
Protein,UA: NEGATIVE
RBC, UA: NEGATIVE
Specific Gravity, UA: 1.014 (ref 1.005–1.030)
Urobilinogen, Ur: 0.2 mg/dL (ref 0.2–1.0)
pH, UA: 5.5 (ref 5.0–7.5)

## 2024-02-09 LAB — ANA,IFA RA DIAG PNL W/RFLX TIT/PATN
ANA Titer 1: NEGATIVE
Cyclic Citrullin Peptide Ab: 7 U (ref 0–19)
Rheumatoid fact SerPl-aCnc: 10.1 [IU]/mL (ref <14.0)

## 2024-02-09 LAB — MICROSCOPIC EXAMINATION
Bacteria, UA: NONE SEEN
Casts: NONE SEEN /LPF
Epithelial Cells (non renal): NONE SEEN /HPF (ref 0–10)
RBC, Urine: NONE SEEN /HPF (ref 0–2)

## 2024-02-18 ENCOUNTER — Encounter: Payer: Self-pay | Admitting: Family Medicine

## 2024-02-20 ENCOUNTER — Other Ambulatory Visit (HOSPITAL_COMMUNITY): Payer: Self-pay

## 2024-03-18 DIAGNOSIS — K080 Exfoliation of teeth due to systemic causes: Secondary | ICD-10-CM | POA: Diagnosis not present

## 2024-03-20 DIAGNOSIS — K439 Ventral hernia without obstruction or gangrene: Secondary | ICD-10-CM | POA: Diagnosis not present

## 2024-05-06 DIAGNOSIS — K080 Exfoliation of teeth due to systemic causes: Secondary | ICD-10-CM | POA: Diagnosis not present

## 2024-05-07 DIAGNOSIS — K5900 Constipation, unspecified: Secondary | ICD-10-CM | POA: Diagnosis not present

## 2024-05-07 DIAGNOSIS — J439 Emphysema, unspecified: Secondary | ICD-10-CM | POA: Diagnosis not present

## 2024-05-12 ENCOUNTER — Other Ambulatory Visit: Payer: Self-pay | Admitting: Family Medicine

## 2024-05-12 DIAGNOSIS — R918 Other nonspecific abnormal finding of lung field: Secondary | ICD-10-CM | POA: Diagnosis not present

## 2024-05-12 DIAGNOSIS — Z148 Genetic carrier of other disease: Secondary | ICD-10-CM | POA: Diagnosis not present

## 2024-05-12 DIAGNOSIS — Z01818 Encounter for other preprocedural examination: Secondary | ICD-10-CM | POA: Diagnosis not present

## 2024-05-12 DIAGNOSIS — J449 Chronic obstructive pulmonary disease, unspecified: Secondary | ICD-10-CM | POA: Diagnosis not present

## 2024-05-14 DIAGNOSIS — Z148 Genetic carrier of other disease: Secondary | ICD-10-CM | POA: Diagnosis not present

## 2024-05-14 DIAGNOSIS — J449 Chronic obstructive pulmonary disease, unspecified: Secondary | ICD-10-CM | POA: Diagnosis not present

## 2024-05-14 DIAGNOSIS — Z01818 Encounter for other preprocedural examination: Secondary | ICD-10-CM | POA: Diagnosis not present

## 2024-05-14 DIAGNOSIS — R918 Other nonspecific abnormal finding of lung field: Secondary | ICD-10-CM | POA: Diagnosis not present

## 2024-05-28 DIAGNOSIS — H401132 Primary open-angle glaucoma, bilateral, moderate stage: Secondary | ICD-10-CM | POA: Diagnosis not present

## 2024-06-14 DIAGNOSIS — J449 Chronic obstructive pulmonary disease, unspecified: Secondary | ICD-10-CM | POA: Diagnosis not present

## 2024-06-14 DIAGNOSIS — Z01818 Encounter for other preprocedural examination: Secondary | ICD-10-CM | POA: Diagnosis not present

## 2024-06-14 DIAGNOSIS — Z148 Genetic carrier of other disease: Secondary | ICD-10-CM | POA: Diagnosis not present

## 2024-06-14 DIAGNOSIS — R918 Other nonspecific abnormal finding of lung field: Secondary | ICD-10-CM | POA: Diagnosis not present

## 2024-06-23 ENCOUNTER — Other Ambulatory Visit: Payer: Self-pay | Admitting: Family Medicine

## 2024-06-25 ENCOUNTER — Other Ambulatory Visit: Payer: Self-pay | Admitting: Family Medicine

## 2024-07-02 LAB — CBC: RBC: 5.51 — AB (ref 3.87–5.11)

## 2024-07-02 LAB — BASIC METABOLIC PANEL WITH GFR
BUN: 7 (ref 4–21)
CO2: 25 — AB (ref 13–22)
Chloride: 105 (ref 99–108)
Creatinine: 0.9 (ref 0.5–1.1)
Glucose: 84
Potassium: 4.2 meq/L (ref 3.5–5.1)
Sodium: 145 (ref 137–147)

## 2024-07-02 LAB — HEPATIC FUNCTION PANEL
ALT: 28 U/L (ref 7–35)
AST: 19 (ref 13–35)
Alkaline Phosphatase: 118 (ref 25–125)
Bilirubin, Total: 0.5

## 2024-07-02 LAB — CBC AND DIFFERENTIAL
HCT: 48 — AB (ref 36–46)
Hemoglobin: 14.9 (ref 12.0–16.0)
Platelets: 299 K/uL (ref 150–400)
WBC: 4.9

## 2024-07-02 LAB — COMPREHENSIVE METABOLIC PANEL WITH GFR
Albumin: 4.5 (ref 3.5–5.0)
Calcium: 9.8 (ref 8.7–10.7)
Globulin: 3.2
eGFR: 98

## 2024-07-02 LAB — LIPID PANEL
Cholesterol: 153 (ref 0–200)
HDL: 65 (ref 35–70)
LDL Cholesterol: 74
Triglycerides: 70 (ref 40–160)

## 2024-07-15 DIAGNOSIS — J449 Chronic obstructive pulmonary disease, unspecified: Secondary | ICD-10-CM | POA: Diagnosis not present

## 2024-07-15 DIAGNOSIS — R918 Other nonspecific abnormal finding of lung field: Secondary | ICD-10-CM | POA: Diagnosis not present

## 2024-07-15 DIAGNOSIS — Z01818 Encounter for other preprocedural examination: Secondary | ICD-10-CM | POA: Diagnosis not present

## 2024-07-15 DIAGNOSIS — Z148 Genetic carrier of other disease: Secondary | ICD-10-CM | POA: Diagnosis not present

## 2024-07-23 ENCOUNTER — Encounter: Payer: Self-pay | Admitting: Family Medicine

## 2024-07-23 ENCOUNTER — Ambulatory Visit: Admitting: Family Medicine

## 2024-07-23 VITALS — BP 149/72 | HR 60 | Ht 64.0 in | Wt 170.0 lb

## 2024-07-23 DIAGNOSIS — R8290 Unspecified abnormal findings in urine: Secondary | ICD-10-CM

## 2024-07-23 DIAGNOSIS — R3989 Other symptoms and signs involving the genitourinary system: Secondary | ICD-10-CM | POA: Diagnosis not present

## 2024-07-23 DIAGNOSIS — Z23 Encounter for immunization: Secondary | ICD-10-CM

## 2024-07-23 DIAGNOSIS — Z0000 Encounter for general adult medical examination without abnormal findings: Secondary | ICD-10-CM

## 2024-07-23 DIAGNOSIS — R7303 Prediabetes: Secondary | ICD-10-CM

## 2024-07-23 DIAGNOSIS — J439 Emphysema, unspecified: Secondary | ICD-10-CM

## 2024-07-23 DIAGNOSIS — E782 Mixed hyperlipidemia: Secondary | ICD-10-CM | POA: Diagnosis not present

## 2024-07-23 DIAGNOSIS — B001 Herpesviral vesicular dermatitis: Secondary | ICD-10-CM

## 2024-07-23 DIAGNOSIS — E039 Hypothyroidism, unspecified: Secondary | ICD-10-CM

## 2024-07-23 LAB — POCT URINALYSIS DIP (CLINITEK)
Bilirubin, UA: NEGATIVE
Blood, UA: NEGATIVE
Glucose, UA: NEGATIVE mg/dL
Ketones, POC UA: NEGATIVE mg/dL
Nitrite, UA: NEGATIVE
POC PROTEIN,UA: NEGATIVE
Spec Grav, UA: 1.015 (ref 1.010–1.025)
Urobilinogen, UA: 0.2 U/dL
pH, UA: 7 (ref 5.0–8.0)

## 2024-07-23 MED ORDER — FLUTICASONE FUROATE-VILANTEROL 100-25 MCG/ACT IN AEPB: 1.0000 | INHALATION_SPRAY | Freq: Every day | RESPIRATORY_TRACT | 11 refills | Status: AC

## 2024-07-23 MED ORDER — LEVOTHYROXINE SODIUM 100 MCG PO TABS: 100.0000 ug | ORAL_TABLET | Freq: Every day | ORAL | 3 refills | Status: DC

## 2024-07-23 MED ORDER — VALACYCLOVIR HCL 1 G PO TABS: ORAL_TABLET | ORAL | 1 refills | Status: AC

## 2024-07-23 MED ORDER — ATORVASTATIN CALCIUM 40 MG PO TABS: ORAL_TABLET | ORAL | 3 refills | Status: AC

## 2024-07-23 NOTE — Patient Instructions (Signed)
 Preventive Care 83 Years and Older, Female Preventive care refers to lifestyle choices and visits with your health care provider that can promote health and wellness. Preventive care visits are also called wellness exams. What can I expect for my preventive care visit? Counseling Your health care provider may ask you questions about your: Medical history, including: Past medical problems. Family medical history. Pregnancy and menstrual history. History of falls. Current health, including: Memory and ability to understand (cognition). Emotional well-being. Home life and relationship well-being. Sexual activity and sexual health. Lifestyle, including: Alcohol, nicotine or tobacco, and drug use. Access to firearms. Diet, exercise, and sleep habits. Work and work Astronomer. Sunscreen use. Safety issues such as seatbelt and bike helmet use. Physical exam Your health care provider will check your: Height and weight. These may be used to calculate your BMI (body mass index). BMI is a measurement that tells if you are at a healthy weight. Waist circumference. This measures the distance around your waistline. This measurement also tells if you are at a healthy weight and may help predict your risk of certain diseases, such as type 2 diabetes and high blood pressure. Heart rate and blood pressure. Body temperature. Skin for abnormal spots. What immunizations do I need?  Vaccines are usually given at various ages, according to a schedule. Your health care provider will recommend vaccines for you based on your age, medical history, and lifestyle or other factors, such as travel or where you work. What tests do I need? Screening Your health care provider may recommend screening tests for certain conditions. This may include: Lipid and cholesterol levels. Hepatitis C test. Hepatitis B test. HIV (human immunodeficiency virus) test. STI (sexually transmitted infection) testing, if you are at  risk. Lung cancer screening. Colorectal cancer screening. Diabetes screening. This is done by checking your blood sugar (glucose) after you have not eaten for a while (fasting). Mammogram. Talk with your health care provider about how often you should have regular mammograms. BRCA-related cancer screening. This may be done if you have a family history of breast, ovarian, tubal, or peritoneal cancers. Bone density scan. This is done to screen for osteoporosis. Talk with your health care provider about your test results, treatment options, and if necessary, the need for more tests. Follow these instructions at home: Eating and drinking  Eat a diet that includes fresh fruits and vegetables, whole grains, lean protein, and low-fat dairy products. Limit your intake of foods with high amounts of sugar, saturated fats, and salt. Take vitamin and mineral supplements as recommended by your health care provider. Do not drink alcohol if your health care provider tells you not to drink. If you drink alcohol: Limit how much you have to 0-1 drink a day. Know how much alcohol is in your drink. In the [place] one drink equals one 12 oz bottle of beer (355 mL), one 5 oz glass of wine (148 mL), or one 1 oz glass of hard liquor (44 mL). Lifestyle Brush your teeth every morning and night with fluoride toothpaste. Floss one time each day. Exercise for at least 30 minutes 5 or more days each week. Do not use any products that contain nicotine or tobacco. These products include cigarettes, chewing tobacco, and vaping devices, such as e-cigarettes. If you need help quitting, ask your health care provider. Do not use drugs. If you are sexually active, practice safe sex. Use a condom or other form of protection in order to prevent STIs. Take aspirin only as told by  your health care provider. Make sure that you understand how much to take and what form to take. Work with your health care provider to find out whether it  is safe and beneficial for you to take aspirin daily. Ask your health care provider if you need to take a cholesterol-lowering medicine (statin). Find healthy ways to manage stress, such as: Meditation, yoga, or listening to music. Journaling. Talking to a trusted person. Spending time with friends and family. Minimize exposure to UV radiation to reduce your risk of skin cancer. Safety Always wear your seat belt while driving or riding in a vehicle. Do not drive: If you have been drinking alcohol. Do not ride with someone who has been drinking. When you are tired or distracted. While texting. If you have been using any mind-altering substances or drugs. Wear a helmet and other protective equipment during sports activities. If you have firearms in your house, make sure you follow all gun safety procedures. What's next? Visit your health care provider once a year for an annual wellness visit. Ask your health care provider how often you should have your eyes and teeth checked. Stay up to date on all vaccines. This information is not intended to replace advice given to you by your health care provider. Make sure you discuss any questions you have with your health care provider. Document Revised: 04/27/2021 Document Reviewed: 04/27/2021 Elsevier Patient Education  2024 [employer].

## 2024-07-23 NOTE — Assessment & Plan Note (Addendum)
 Well adult Orders Placed This Encounter  Procedures   CMP14+EGFR   CBC with Differential/Platelet   Lipid Panel With LDL/HDL Ratio   TSH   HgB J8r  Screenings: Per lab orders Immunizations: flu vaccine given.  Anticipatory guidance/risk factor reduction: Recommendations per AVS

## 2024-07-23 NOTE — Addendum Note (Signed)
 Addended by: FRANCHOT ARTA PEDLAR on: 07/23/2024 11:26 AM   Modules accepted: Orders

## 2024-07-23 NOTE — Progress Notes (Signed)
 Diana Solis - 68 y.o. female MRN [id number]  Date of birth: 11/02/1956  Subjective Chief Complaint  Patient presents with   COPD   Urinary Tract Infection    HPI Diana Solis is a 68 y.o. female here today for annual exam.   She reports that she is doing ok.   She is somewhat active and feels like she does pretty well with diet.   She does vape.  Occasional  EtOH.   Review of Systems  Constitutional:  Negative for chills, fever, malaise/fatigue and weight loss.  HENT:  Negative for congestion, ear pain and sore throat.   Eyes:  Negative for blurred vision, double vision and pain.  Respiratory:  Negative for cough and shortness of breath.   Cardiovascular:  Negative for chest pain and palpitations.  Gastrointestinal:  Negative for abdominal pain, blood in stool, constipation, heartburn and nausea.  Genitourinary:  Negative for dysuria and urgency.  Musculoskeletal:  Negative for joint pain and myalgias.  Neurological:  Negative for dizziness and headaches.  Endo/Heme/Allergies:  Does not bruise/bleed easily.  Psychiatric/Behavioral:  Negative for depression. The patient is not nervous/anxious and does not have insomnia.     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   Hypothyroid    Urinary incontinence 10/19/2011    Past Surgical History:  Procedure Laterality Date   BREAST CYST EXCISION     BREAST EXCISIONAL BIOPSY     INCONTINENCE SURGERY     TONSILLECTOMY     VAGINAL HYSTERECTOMY      Social History   Socioeconomic History   Marital status: Married    Spouse name: Darrel   Number of children: 1   Years of education: Not on file   Highest  education level: Not on file  Occupational History   Not on file  Tobacco Use   Smoking status: Former    Current packs/day: 0.50    Average packs/day: 0.5 packs/day for 40.0 years (20.0 ttl pk-yrs)    Types: Cigarettes   Smokeless tobacco: Current   Tobacco comments:    e-cig  Vaping Use   Vaping status: Never Used  Substance and Sexual Activity   Alcohol use: Yes    Alcohol/week: 6.0 standard drinks of alcohol    Types: 6 Standard drinks or equivalent per week   Drug use: No   Sexual activity: Not on file  Other Topics Concern   Not on file  Social History Narrative   No regular exercise. 3- 4 caffeine drinks per day. 3 grandkids.    Social Drivers of Corporate investment banker Strain: Not on file  Food Insecurity: Not on file  Transportation Needs: Not on file  Physical Activity: Not on file  Stress: Not on file  Social Connections: Unknown (07/26/2022)   Received from [facility]   Social Network    Social Network: Not on file    Family History  Problem Relation Age of Onset   Asthma Mother    Thyroid  disease Mother    Lung cancer Father        smoker   Lupus Father     Health Maintenance  Topic Date Due   Lung Cancer Screening  Never done   Medicare Annual Wellness (AWV)  07/13/2022   Influenza Vaccine  06/13/2024   COVID-19 Vaccine (10 - 2025-26 season) 07/14/2024   MAMMOGRAM  07/17/2025   Colonoscopy  03/29/2027   DTaP/Tdap/Td (3 - Td or Tdap) 08/01/2032   Pneumococcal Vaccine: 50+ Years  Completed   DEXA SCAN  Completed   Hepatitis C Screening  Completed   Zoster Vaccines- Shingrix  Completed   HPV VACCINES  Aged Out   Meningococcal B Vaccine  Aged Out     ----------------------------------------------------------------------------------------------------------------------------------------------------------------------------------------------------------------- Physical Exam BP (!) 149/72 (BP Location: Left Arm, Patient Position: Sitting,  Cuff Size: Normal)   Pulse 60   Ht 5' 4 (1.626 m)   Wt 170 lb (77.1 kg)   SpO2 93%   BMI 29.18 kg/m   Physical Exam Constitutional:      General: She is not in acute distress. HENT:     Head: Normocephalic and atraumatic.     Right Ear: Tympanic membrane and ear canal normal.     Left Ear: Tympanic membrane and ear canal normal.     Nose: Nose normal.  Eyes:     General: No scleral icterus.    Conjunctiva/sclera: Conjunctivae normal.  Neck:     Thyroid : No thyromegaly.  Cardiovascular:     Rate and Rhythm: Normal rate and regular rhythm.     Heart sounds: Normal heart sounds.  Pulmonary:     Effort: Pulmonary effort is normal.     Breath sounds: Normal breath sounds.  Abdominal:     General: Bowel sounds are normal. There is no distension.     Palpations: Abdomen is soft.     Tenderness: There is no abdominal tenderness. There is no guarding.  Musculoskeletal:        General: Normal range of motion.     Cervical back: Normal range of motion and neck supple.  Lymphadenopathy:     Cervical: No cervical adenopathy.  Skin:    General: Skin is warm and dry.     Findings: No rash.  Neurological:     General: No focal deficit present.     Mental Status: She is alert and oriented to person, place, and time.     Cranial Nerves: No cranial nerve deficit.     Coordination: Coordination normal.  Psychiatric:        Mood and Affect: Mood normal.        Behavior: Behavior normal.     ------------------------------------------------------------------------------------------------------------------------------------------------------------------------------------------------------------------- Assessment and Plan  Well adult exam Well adult Orders Placed This Encounter  Procedures   CMP14+EGFR   CBC with Differential/Platelet   Lipid Panel With LDL/HDL Ratio   TSH   HgB J8r  Screenings: Per lab orders Immunizations: flu vaccine given.  Anticipatory guidance/risk  factor reduction: Recommendations per AVS   Meds ordered this encounter  Medications   atorvastatin  (LIPITOR) 40 MG tablet    Sig: TAKE 1 TABLET(40 MG) BY MOUTH DAILY.    Dispense:  90 tablet    Refill:  3   fluticasone  furoate-vilanterol (BREO ELLIPTA ) 100-25 MCG/ACT AEPB    Sig: Inhale 1 puff into the lungs daily.    Dispense:  1 each    Refill:  11   levothyroxine  (SYNTHROID ) 100 MCG tablet    Sig: Take 1 tablet (100 mcg total) by mouth daily before breakfast.    Dispense:  90 tablet    Refill:  3   valACYclovir  (VALTREX )  1000 MG tablet    Sig: TAKE 1 TABLET(1000 MG) BY MOUTH DAILY    Dispense:  90 tablet    Refill:  1    No follow-ups on file.

## 2024-07-24 ENCOUNTER — Other Ambulatory Visit: Payer: Self-pay

## 2024-07-24 ENCOUNTER — Other Ambulatory Visit: Payer: Self-pay | Admitting: Family Medicine

## 2024-07-24 LAB — CBC WITH DIFFERENTIAL/PLATELET
Basophils Absolute: 0.1 x10E3/uL (ref 0.0–0.2)
Basos: 2 %
EOS (ABSOLUTE): 0.1 x10E3/uL (ref 0.0–0.4)
Eos: 3 %
Hematocrit: 41.9 % (ref 34.0–46.6)
Hemoglobin: 13.7 g/dL (ref 11.1–15.9)
Immature Grans (Abs): 0 x10E3/uL (ref 0.0–0.1)
Immature Granulocytes: 0 %
Lymphocytes Absolute: 2.1 x10E3/uL (ref 0.7–3.1)
Lymphs: 42 %
MCH: 30.7 pg (ref 26.6–33.0)
MCHC: 32.7 g/dL (ref 31.5–35.7)
MCV: 94 fL (ref 79–97)
Monocytes Absolute: 0.5 x10E3/uL (ref 0.1–0.9)
Monocytes: 10 %
Neutrophils Absolute: 2.1 x10E3/uL (ref 1.4–7.0)
Neutrophils: 43 %
Platelets: 262 x10E3/uL (ref 150–450)
RBC: 4.46 x10E6/uL (ref 3.77–5.28)
RDW: 12.6 % (ref 11.7–15.4)
WBC: 4.9 x10E3/uL (ref 3.4–10.8)

## 2024-07-24 LAB — CMP14+EGFR
ALT: 20 IU/L (ref 0–32)
AST: 18 IU/L (ref 0–40)
Albumin: 4.6 g/dL (ref 3.9–4.9)
Alkaline Phosphatase: 117 IU/L (ref 44–121)
BUN/Creatinine Ratio: 30 — ABNORMAL HIGH (ref 12–28)
BUN: 17 mg/dL (ref 8–27)
Bilirubin Total: 0.4 mg/dL (ref 0.0–1.2)
CO2: 23 mmol/L (ref 20–29)
Calcium: 9.5 mg/dL (ref 8.7–10.3)
Chloride: 99 mmol/L (ref 96–106)
Creatinine, Ser: 0.56 mg/dL — ABNORMAL LOW (ref 0.57–1.00)
Globulin, Total: 2.4 g/dL (ref 1.5–4.5)
Glucose: 100 mg/dL — ABNORMAL HIGH (ref 70–99)
Potassium: 4.6 mmol/L (ref 3.5–5.2)
Sodium: 137 mmol/L (ref 134–144)
Total Protein: 7 g/dL (ref 6.0–8.5)
eGFR: 99 mL/min/1.73 (ref >59)

## 2024-07-24 LAB — LIPID PANEL WITH LDL/HDL RATIO
Cholesterol, Total: 158 mg/dL (ref 100–199)
HDL: 62 mg/dL (ref >39)
LDL Chol Calc (NIH): 79 mg/dL (ref 0–99)
LDL/HDL Ratio: 1.3 ratio (ref 0.0–3.2)
Triglycerides: 89 mg/dL (ref 0–149)
VLDL Cholesterol Cal: 17 mg/dL (ref 5–40)

## 2024-07-24 LAB — HEMOGLOBIN A1C
Est. average glucose Bld gHb Est-mCnc: 131 mg/dL
Hgb A1c MFr Bld: 6.2 % — ABNORMAL HIGH (ref 4.8–5.6)

## 2024-07-24 LAB — TSH: TSH: 1 u[IU]/mL (ref 0.450–4.500)

## 2024-07-24 MED ORDER — SPIRIVA RESPIMAT 2.5 MCG/ACT IN AERS: INHALATION_SPRAY | RESPIRATORY_TRACT | 3 refills | Status: DC

## 2024-07-24 NOTE — Telephone Encounter (Signed)
 This task has been completed. Med refill sent to the preferred pharmacy as requested. No further action needed.

## 2024-07-24 NOTE — Telephone Encounter (Signed)
 Requesting rx rf of Spiriva  Lat written 06/23/2024 Last OV 07/23/2024 Upcoming appt = none

## 2024-07-24 NOTE — Telephone Encounter (Signed)
 Copied from CRM [id number]. Topic: Clinical - Prescription Issue >> Jul 24, 2024 10:46 AM Farrel B wrote: Reason for CRM: Tiotropium Bromide  Monohydrate (SPIRIVA  RESPIMAT) 2.5 MCG/ACT AERS  Calling to request status of medication refill Spouse states he's picked up everything else except the Spiriva 

## 2024-07-24 NOTE — Telephone Encounter (Signed)
 Pt spouse came into the office requesting refill of Sprivia. Pt spouse states they will be leaving on Sat 07/26/2024 to go out of town. Please advise.

## 2024-07-25 NOTE — Assessment & Plan Note (Signed)
 Stable with current inhalers.  Will continue.

## 2024-07-26 LAB — URINE CULTURE

## 2024-08-01 ENCOUNTER — Encounter: Payer: Self-pay | Admitting: Family Medicine

## 2024-08-01 LAB — B12 AND FOLATE PANEL
Folate: 14.1
VITAMIN B12: 403

## 2024-08-04 ENCOUNTER — Ambulatory Visit: Payer: Self-pay | Admitting: Family Medicine

## 2024-08-14 ENCOUNTER — Other Ambulatory Visit: Payer: Self-pay | Admitting: Family Medicine

## 2024-08-14 DIAGNOSIS — Z01818 Encounter for other preprocedural examination: Secondary | ICD-10-CM | POA: Diagnosis not present

## 2024-08-14 DIAGNOSIS — R918 Other nonspecific abnormal finding of lung field: Secondary | ICD-10-CM | POA: Diagnosis not present

## 2024-08-14 DIAGNOSIS — E039 Hypothyroidism, unspecified: Secondary | ICD-10-CM

## 2024-08-14 DIAGNOSIS — Z148 Genetic carrier of other disease: Secondary | ICD-10-CM | POA: Diagnosis not present

## 2024-09-14 DIAGNOSIS — Z148 Genetic carrier of other disease: Secondary | ICD-10-CM | POA: Diagnosis not present

## 2024-09-14 DIAGNOSIS — R918 Other nonspecific abnormal finding of lung field: Secondary | ICD-10-CM | POA: Diagnosis not present

## 2024-09-14 DIAGNOSIS — Z01818 Encounter for other preprocedural examination: Secondary | ICD-10-CM | POA: Diagnosis not present

## 2024-10-14 ENCOUNTER — Encounter: Payer: Self-pay | Admitting: Family Medicine

## 2024-10-14 DIAGNOSIS — R918 Other nonspecific abnormal finding of lung field: Secondary | ICD-10-CM | POA: Diagnosis not present

## 2024-10-14 DIAGNOSIS — Z01818 Encounter for other preprocedural examination: Secondary | ICD-10-CM | POA: Diagnosis not present

## 2024-10-14 DIAGNOSIS — J449 Chronic obstructive pulmonary disease, unspecified: Secondary | ICD-10-CM | POA: Diagnosis not present

## 2024-10-14 DIAGNOSIS — Z148 Genetic carrier of other disease: Secondary | ICD-10-CM | POA: Diagnosis not present

## 2024-11-16 ENCOUNTER — Other Ambulatory Visit: Payer: Self-pay | Admitting: Family Medicine
